# Patient Record
Sex: Male | Born: 1935
Health system: Southern US, Community
[De-identification: ages and names within clinical notes are randomized; demographics above are authoritative.]

## PROBLEM LIST (undated history)

## (undated) DIAGNOSIS — K921 Melena: Secondary | ICD-10-CM

## (undated) DIAGNOSIS — M1990 Unspecified osteoarthritis, unspecified site: Secondary | ICD-10-CM

## (undated) DIAGNOSIS — I77810 Thoracic aortic ectasia: Secondary | ICD-10-CM

## (undated) DIAGNOSIS — K602 Anal fissure, unspecified: Secondary | ICD-10-CM

## (undated) DIAGNOSIS — R7303 Prediabetes: Secondary | ICD-10-CM

## (undated) DIAGNOSIS — I10 Essential (primary) hypertension: Secondary | ICD-10-CM

## (undated) DIAGNOSIS — E785 Hyperlipidemia, unspecified: Secondary | ICD-10-CM

## (undated) DIAGNOSIS — Z87442 Personal history of urinary calculi: Secondary | ICD-10-CM

## (undated) DIAGNOSIS — C4490 Unspecified malignant neoplasm of skin, unspecified: Secondary | ICD-10-CM

## (undated) DIAGNOSIS — K6289 Other specified diseases of anus and rectum: Secondary | ICD-10-CM

## (undated) DIAGNOSIS — N189 Chronic kidney disease, unspecified: Secondary | ICD-10-CM

## (undated) HISTORY — DX: Anal fissure, unspecified: K60.2

## (undated) HISTORY — DX: Essential (primary) hypertension: I10

## (undated) HISTORY — DX: Other specified diseases of anus and rectum: K62.89

## (undated) HISTORY — DX: Melena: K92.1

## (undated) HISTORY — DX: Hyperlipidemia, unspecified: E78.5

## (undated) HISTORY — PX: OTHER SURGICAL HISTORY: SHX169

## (undated) HISTORY — PX: HERNIA REPAIR: SHX51

---

## 1995-07-03 HISTORY — PX: SHOULDER SURGERY: SHX246

## 1998-01-31 ENCOUNTER — Ambulatory Visit (HOSPITAL_COMMUNITY): Admission: RE | Admit: 1998-01-31 | Discharge: 1998-01-31 | Payer: Self-pay | Admitting: Neurosurgery

## 1998-02-03 ENCOUNTER — Inpatient Hospital Stay (HOSPITAL_COMMUNITY): Admission: RE | Admit: 1998-02-03 | Discharge: 1998-02-04 | Payer: Self-pay | Admitting: Neurosurgery

## 1999-02-03 ENCOUNTER — Encounter: Payer: Self-pay | Admitting: Neurosurgery

## 1999-02-03 ENCOUNTER — Ambulatory Visit (HOSPITAL_COMMUNITY): Admission: RE | Admit: 1999-02-03 | Discharge: 1999-02-03 | Payer: Self-pay | Admitting: Neurosurgery

## 1999-07-04 ENCOUNTER — Encounter: Admission: RE | Admit: 1999-07-04 | Discharge: 1999-07-04 | Payer: Self-pay | Admitting: Neurosurgery

## 1999-07-04 ENCOUNTER — Encounter: Payer: Self-pay | Admitting: Neurosurgery

## 1999-08-08 ENCOUNTER — Ambulatory Visit (HOSPITAL_BASED_OUTPATIENT_CLINIC_OR_DEPARTMENT_OTHER): Admission: RE | Admit: 1999-08-08 | Discharge: 1999-08-08 | Payer: Self-pay | Admitting: Otolaryngology

## 1999-08-08 ENCOUNTER — Encounter (INDEPENDENT_AMBULATORY_CARE_PROVIDER_SITE_OTHER): Payer: Self-pay | Admitting: *Deleted

## 2004-06-02 ENCOUNTER — Ambulatory Visit (HOSPITAL_COMMUNITY): Admission: RE | Admit: 2004-06-02 | Discharge: 2004-06-02 | Payer: Self-pay | Admitting: Neurosurgery

## 2006-08-13 ENCOUNTER — Ambulatory Visit: Payer: Self-pay | Admitting: Gastroenterology

## 2006-09-04 ENCOUNTER — Ambulatory Visit: Payer: Self-pay | Admitting: Gastroenterology

## 2007-07-30 ENCOUNTER — Ambulatory Visit (HOSPITAL_COMMUNITY): Admission: RE | Admit: 2007-07-30 | Discharge: 2007-07-30 | Payer: Self-pay | Admitting: Neurosurgery

## 2008-07-06 ENCOUNTER — Ambulatory Visit (HOSPITAL_COMMUNITY): Admission: RE | Admit: 2008-07-06 | Discharge: 2008-07-06 | Payer: Self-pay | Admitting: General Surgery

## 2009-04-08 ENCOUNTER — Ambulatory Visit (HOSPITAL_COMMUNITY): Admission: RE | Admit: 2009-04-08 | Discharge: 2009-04-08 | Payer: Self-pay | Admitting: Neurosurgery

## 2010-10-05 LAB — CREATININE, SERUM
Creatinine, Ser: 1.06 mg/dL (ref 0.4–1.5)
GFR calc Af Amer: >60 mL/min (ref >60)
GFR calc non Af Amer: >60 mL/min (ref >60)

## 2010-11-14 NOTE — Op Note (Signed)
NAME:  Jesse Fletcher, Jesse Fletcher NO.:  [id number]   MEDICAL RECORD NO.:  [id number]          PATIENT TYPE:  AMB   LOCATION:  DAY                          FACILITY:  [facility]   PHYSICIAN:  Adolph Pollack, M.D.DATE OF BIRTH:  May 09, 1935   DATE OF PROCEDURE:  07/06/2008  DATE OF DISCHARGE:                               OPERATIVE REPORT   PREOPERATIVE DIAGNOSIS:  Right inguinal hernia.   POSTOPERATIVE DIAGNOSIS:  Indirect right inguinal hernia.   PROCEDURE:  Laparoscopic right inguinal hernia repair with mesh.   SURGEON:  Adolph Pollack, M.D.   ANESTHESIA:  General plus Marcaine local.   INDICATIONS:  This 76 year old male has been diagnosed with a right  inguinal hernia that is reducible.  He requests repair and he would like  the laparoscopic repair.  He now presents for that.  We discussed  procedure, risks and aftercare preoperatively.   TECHNIQUE:  He was seen in the holding area.  He was marked with my  initials in the right groin.  He voided and was brought to the operating  room, placed supine on the operating table and general anesthetic was  administered.  The hair on the lower abdominal wall was clipped and the  area sterilely prepped and draped.  Marcaine solution was infiltrated in  the subumbilical region.  A small transverse subumbilical incision was  made through the skin and subcutaneous tissue.  The right anterior  rectus sheath was exposed and a small incision made in it.  The  underlying right rectus muscle was swept laterally exposing the  posterior rectus sheath.  A balloon dissection device and trocar  combination were then inserted into the extraperitoneal space and  balloon dissection was performed under laparoscopic vision of the right  extraperitoneal space and lower midline.  Once this was adequate,  balloon was removed and CO2 gas insufflated creating a working space.  A  laparoscope was introduced and two 5 mm trocars were placed in the  lower  abdomen just to the right in the midline.   Using blunt dissection, I identified the symphysis pubis and Cooper's  ligament on the right side.  The direct space appeared to be fairly  solid.  I then dissected fibrofatty tissue free from the lateral  abdominal wall up to the level of the umbilicus and from the anterior  abdominal wall as well.  I exposed spermatic cord and noted an indirect  hernia sac as well as indirect hernia contents and some extraperitoneal  fat going through a patulous internal ring.  I created a window around  the spermatic cord and dissected the sac off the cord as well as  extraperitoneal fat to the level of the umbilicus.   I brought in a piece of 6 x 6 Parietex mesh and cut it to be 5.6.  A  partial longitudinal slit was cut into the mesh and it was then placed  into the right extraperitoneal space.  The mesh was positioned  appropriately with two tails wrapped around the spermatic cord.   The mesh was then anchored to Cooper's ligament,  the anterior and  lateral abdominal walls with spiral tacks.  This provided for adequate  coverage of the direct, indirect and femoral spaces with good overlap.   I then inspected the area and hemostasis was adequate.  The CO2 gas was  released and I watched the extraperitoneal contents approximate the  mesh.  All trocars were then removed.   The right anterior rectus muscle fascial defect was closed with  interrupted 0 Vicryl sutures.  The skin incisions were closed with 4-0  Monocryl subcuticular stitches.  Steri-Strips and sterile dressings were  applied.   He tolerated the procedure without any apparent complications and was  taken to recovery in satisfactory condition.  He will be given discharge  instructions and Percocet for pain.  Follow-up in the office in 2-3  weeks.      Adolph Pollack, M.D.  Electronically Signed     TJR/MEDQ  D:  07/06/2008  T:  07/06/2008  Job:  [id number]   cc:   Excell Seltzer.  Annabell Howells, M.D.  Fax: [phone]

## 2010-11-17 NOTE — Assessment & Plan Note (Signed)
[facility]                         GASTROENTEROLOGY OFFICE NOTE   Jesse Fletcher, Jesse Fletcher                       MRN:          [id number]  DATE:08/13/2006                            DOB:          July 20, 1934    REASON FOR REFERRAL:  Dr. Christell Constant asked me to evaluate Jesse Fletcher in  consultation regarding chronic dyspepsia.   HISTORY OF PRESENT ILLNESS:  Jesse Fletcher is a very pleasant 76 year old  man who has been on chronic Aleve and Advil for many years for various  orthopedic pains. Jesse Fletcher says about 2 years ago Jesse Fletcher started having some upper  GI discomforts, belching, churning feeling, gassy feeling in his  stomach. Jesse Fletcher held his Aleve and Advil for about 3 weeks and things  defiantly improved. Jesse Fletcher restarted the meds after that. Jesse Fletcher noticed a  return of these discomforts for the past 3 to 4 months. Jesse Fletcher describes it  again as a churning feeling, a gassiness, extreme hunger in the morning.  Three weeks ago Jesse Fletcher saw his primary care physician who held his Aleve and  Advil and started him on Protonix once daily taken 20 to 30 minutes  before his breakfast meal. Jesse Fletcher says since making those 2 changes Jesse Fletcher is  40% to 50% better.   Recent lab tests done 2 weeks ago show a normal CBC, a normal complete  metabolic profile.   REVIEW OF SYSTEMS:  Notable for no fevers, chills, no swallowing  difficulty, no nausea, vomiting, no overt GI bleeding. Jesse Fletcher has  intermittent constipation, this is very rare. No pyrosis. Jesse Fletcher has had  stable weight for years.   PAST MEDICAL HISTORY:  Hypertension, kidney stones, shoulder and back  surgery.   CURRENT MEDICATIONS:  Protonix, lisinopril, hydrochlorothiazide, Centrum  Silver.   ALLERGIES:  No known drug allergies.   SOCIAL HISTORY:  Widowed, lives by himself, 2 children, retried from  sales, nonsmoker, nondrinker.   FAMILY HISTORY:  No colon cancer, colon polyps in family.   PHYSICAL EXAMINATION:  Jesse Fletcher is 6 feet 2 inches, 213 pounds, blood  pressure  132/80, pulse 100.  CONSTITUTIONAL: Generally well-appearing.  NEUROLOGIC: Alert and oriented x3.  EYES: Extraocular movements intact.  MOUTH: Oropharynx  moist, no lesions.  NECK: Supple, no lymphadenopathy.  CARDIOVASCULAR/HEART: Regular rate and rhythm.  LUNGS: Clear to auscultation bilaterally.  ABDOMEN: Soft, nontender, nondistended, normal bowel sounds.  EXTREMITIES: No lower extremity edema.  SKIN: No rashes or lesions on visible extremities.   ASSESSMENT/PLAN:  A 76 year old man with dyspepsia likely nonsteroidal  anti-inflammatory drug (NSAID) related.   Jesse Fletcher has noticed that since beginning the Protonix Jesse Fletcher has daily mild  headaches,  this not an uncommon side effect from PPIs. I think his  discomforts were probably NSAID related but to be safe given his age I  will arrange for him to have EGD at his soonest convenience. Jesse Fletcher has been  having colorectal cancer screening with flexible sigmoidoscopy every 5  years and Jesse Fletcher is due for 1 in the next several months Jesse Fletcher believes and so  I will arrange for him to have a full colonoscopy at that  time as well.  If that shows no polyps, then Jesse Fletcher does not need colorectal cancer  screening for 10 years. I see no reason for any further blood test or  imaging studies prior to colonoscopy and an upper endoscopy.     Rachael Fee, MD  Electronically Signed    DPJ/MedQ  DD: 08/13/2006  DT: 08/13/2006  Job #: [id number]   cc:   Ernestina Penna, M.D.

## 2011-04-06 LAB — CBC
MCV: 88.3 fL (ref 78.0–100.0)
Platelets: 266 10*3/uL (ref 150–400)
RBC: 5.04 MIL/uL (ref 4.22–5.81)
WBC: 6.9 10*3/uL (ref 4.0–10.5)

## 2011-04-06 LAB — COMPREHENSIVE METABOLIC PANEL
ALT: 21 U/L (ref 0–53)
AST: 26 U/L (ref 0–37)
Albumin: 3.9 g/dL (ref 3.5–5.2)
Alkaline Phosphatase: 59 U/L (ref 39–117)
CO2: 30 mEq/L (ref 19–32)
Chloride: 105 mEq/L (ref 96–112)
Creatinine, Ser: 0.85 mg/dL (ref 0.4–1.5)
GFR calc Af Amer: >60 mL/min (ref >60)
GFR calc non Af Amer: >60 mL/min (ref >60)
Potassium: 4.7 mEq/L (ref 3.5–5.1)
Sodium: 143 mEq/L (ref 135–145)
Total Bilirubin: 0.8 mg/dL (ref 0.3–1.2)

## 2011-04-06 LAB — DIFFERENTIAL
Basophils Absolute: 0 10*3/uL (ref 0.0–0.1)
Basophils Relative: 0 % (ref 0–1)
Eosinophils Absolute: 0.3 10*3/uL (ref 0.0–0.7)
Eosinophils Relative: 4 % (ref 0–5)
Monocytes Absolute: 0.5 10*3/uL (ref 0.1–1.0)

## 2011-05-21 ENCOUNTER — Ambulatory Visit (INDEPENDENT_AMBULATORY_CARE_PROVIDER_SITE_OTHER): Payer: Medicare Other | Admitting: General Surgery

## 2011-05-21 ENCOUNTER — Encounter (INDEPENDENT_AMBULATORY_CARE_PROVIDER_SITE_OTHER): Payer: Self-pay | Admitting: General Surgery

## 2011-05-21 DIAGNOSIS — K649 Unspecified hemorrhoids: Secondary | ICD-10-CM | POA: Insufficient documentation

## 2011-05-21 DIAGNOSIS — K602 Anal fissure, unspecified: Secondary | ICD-10-CM | POA: Insufficient documentation

## 2011-05-21 NOTE — Progress Notes (Signed)
76 yo male coming in with complaint of anal fissure.  Pt has been dealing with problem for the last 25 years on and off.   Was seen By Dr. Sharlette Dense previously.  Pt has been struggling with constipation as well and now using a stool softner daily.   Pt states 2 weeks ago had a hard stool felt a little tearing and had pain for about 1 week, since that time doing much better but wanted to see if there is anything that can be done for it.  Pt denies BRBPR, melena or any more strainging.  ROS Denies fever, chills, nausea vomiting abdominal pain, dysuria, chest pain, shortness of breath dyspnea on exertion or numbness in extremities  Past medical history, social, surgical and family history all reviewed.   PE: Blood pressure 128/82, pulse 68, temperature 97.2 F (36.2 C), temperature source Temporal, resp. rate 16, height 6\' 2"  (1.88 m), weight 210 lb 3.2 oz (95.346 kg). Gen: NAD pleasant male Rectal exam:  Pt has no external hemorrhoids present, pt has a healed scar at the 12 o clock position of the rectal mucosa. No internal hemorrhoids present, pt NT on exam, no active fissure.   A/P Hx of anal fissures, Hx of external hemorrhoids. None that are active.  Dilt 2.5% cream to have on hand Colace increased to BID to help with stool.   Pt to RTC PRN, pt given red flags.

## 2011-05-21 NOTE — Patient Instructions (Signed)
Nice to see you I am giving you some Diltizem to have on hand. Use this maybe 2-3 times a week unless you have a tear and then would consider doing it four times a day. Would increase your colace to 100 mg twice a day to help make sure you do not get another tear.  Call us if you need to

## 2011-05-29 ENCOUNTER — Encounter (INDEPENDENT_AMBULATORY_CARE_PROVIDER_SITE_OTHER): Payer: Self-pay | Admitting: General Surgery

## 2011-08-22 ENCOUNTER — Ambulatory Visit (INDEPENDENT_AMBULATORY_CARE_PROVIDER_SITE_OTHER): Payer: Medicare Other | Admitting: General Surgery

## 2011-08-22 ENCOUNTER — Encounter (INDEPENDENT_AMBULATORY_CARE_PROVIDER_SITE_OTHER): Payer: Self-pay | Admitting: General Surgery

## 2011-08-22 VITALS — BP 142/86 | HR 67 | Temp 98.1°F | Ht 74.0 in | Wt 210.6 lb

## 2011-08-22 DIAGNOSIS — K602 Anal fissure, unspecified: Secondary | ICD-10-CM

## 2011-08-22 NOTE — Progress Notes (Signed)
He is here for followup for his chronic anal fissure. He still having intermittent episodes of discomfort and bright red blood on the tissue paper after some bowel movements. He's been intermittently using the diltiazem cream.  Exam: Anal rectal-at 10:00 position there is a smooth fissure present that is very small and nonbleeding. It is nontender. No masses are felt on digital rectal exam. Some external hemorrhoids are noted.  Assessment: Chronic anal fissure with intermittent bleeding from it. He is not having much pain from it.  Plan: Use the diltiazem cream 4 times a day for 6 weeks. Followup appointment at that time. If it is still problematic, I told him we could discuss surgical repair.

## 2011-08-22 NOTE — Patient Instructions (Signed)
Apply medicine to area four times a day.

## 2011-10-10 ENCOUNTER — Encounter (INDEPENDENT_AMBULATORY_CARE_PROVIDER_SITE_OTHER): Payer: Self-pay | Admitting: General Surgery

## 2011-10-10 ENCOUNTER — Ambulatory Visit (INDEPENDENT_AMBULATORY_CARE_PROVIDER_SITE_OTHER): Payer: Medicare Other | Admitting: General Surgery

## 2011-10-10 VITALS — BP 124/78 | HR 82 | Temp 98.6°F | Resp 18 | Ht 74.0 in | Wt 212.6 lb

## 2011-10-10 DIAGNOSIS — K602 Anal fissure, unspecified: Secondary | ICD-10-CM

## 2011-10-10 NOTE — Patient Instructions (Signed)
Stay well hydrated. Keep stool soft and use stool softeners as needed for this. Return visit if you start having pain and bleeding from the fissure again.

## 2011-10-10 NOTE — Progress Notes (Signed)
Subjective:     Patient ID: Jesse Fletcher., male   DOB: 22-Aug-1934, 77 y.o.   MRN: [id number]  HPI He is here for follow up of his chronic anal fissure. He has been using diltiazem cream as directed. He has no pain and no bleeding. He is taking a stool softener twice a day and his bowels are soft.  Review of Systems No anal bleeding or pain.    Objective:   Physical Exam Anorectal:  The fissure has healed.    Assessment:     Anal fissure-healed with medical treatment    Plan:     Stay well hydrated and keep stool soft using stool softeners needed. Return visit if a symptomatic fissure recurs.

## 2011-10-24 DIAGNOSIS — E785 Hyperlipidemia, unspecified: Secondary | ICD-10-CM | POA: Diagnosis not present

## 2011-10-24 DIAGNOSIS — I10 Essential (primary) hypertension: Secondary | ICD-10-CM | POA: Diagnosis not present

## 2011-10-26 DIAGNOSIS — E785 Hyperlipidemia, unspecified: Secondary | ICD-10-CM | POA: Diagnosis not present

## 2011-11-15 DIAGNOSIS — B070 Plantar wart: Secondary | ICD-10-CM | POA: Diagnosis not present

## 2011-11-15 DIAGNOSIS — L570 Actinic keratosis: Secondary | ICD-10-CM | POA: Diagnosis not present

## 2011-12-19 DIAGNOSIS — T2000XA Burn of unspecified degree of head, face, and neck, unspecified site, initial encounter: Secondary | ICD-10-CM | POA: Diagnosis not present

## 2012-02-04 DIAGNOSIS — J069 Acute upper respiratory infection, unspecified: Secondary | ICD-10-CM | POA: Diagnosis not present

## 2012-02-14 DIAGNOSIS — D485 Neoplasm of uncertain behavior of skin: Secondary | ICD-10-CM | POA: Diagnosis not present

## 2012-02-14 DIAGNOSIS — L570 Actinic keratosis: Secondary | ICD-10-CM | POA: Diagnosis not present

## 2012-02-28 DIAGNOSIS — K649 Unspecified hemorrhoids: Secondary | ICD-10-CM | POA: Diagnosis not present

## 2012-03-28 DIAGNOSIS — Z23 Encounter for immunization: Secondary | ICD-10-CM | POA: Diagnosis not present

## 2012-04-08 DIAGNOSIS — T7840XA Allergy, unspecified, initial encounter: Secondary | ICD-10-CM | POA: Diagnosis not present

## 2012-05-26 DIAGNOSIS — N138 Other obstructive and reflux uropathy: Secondary | ICD-10-CM | POA: Diagnosis not present

## 2012-06-02 DIAGNOSIS — N403 Nodular prostate with lower urinary tract symptoms: Secondary | ICD-10-CM | POA: Diagnosis not present

## 2012-06-02 DIAGNOSIS — Z87442 Personal history of urinary calculi: Secondary | ICD-10-CM | POA: Diagnosis not present

## 2012-06-02 DIAGNOSIS — N411 Chronic prostatitis: Secondary | ICD-10-CM | POA: Diagnosis not present

## 2012-07-17 ENCOUNTER — Other Ambulatory Visit: Payer: Self-pay | Admitting: Neurosurgery

## 2012-07-23 DIAGNOSIS — M5430 Sciatica, unspecified side: Secondary | ICD-10-CM | POA: Diagnosis not present

## 2012-07-23 DIAGNOSIS — M545 Low back pain, unspecified: Secondary | ICD-10-CM | POA: Diagnosis not present

## 2012-07-24 DIAGNOSIS — T148XXA Other injury of unspecified body region, initial encounter: Secondary | ICD-10-CM | POA: Diagnosis not present

## 2012-07-31 ENCOUNTER — Other Ambulatory Visit (HOSPITAL_COMMUNITY): Payer: Self-pay | Admitting: Neurosurgery

## 2012-07-31 DIAGNOSIS — M5137 Other intervertebral disc degeneration, lumbosacral region: Secondary | ICD-10-CM

## 2012-08-06 ENCOUNTER — Ambulatory Visit (HOSPITAL_COMMUNITY)
Admission: RE | Admit: 2012-08-06 | Discharge: 2012-08-06 | Disposition: A | Discharge status: Home / Self Care | Payer: Medicare Other | Source: Ambulatory Visit | Attending: Neurosurgery | Admitting: Neurosurgery

## 2012-08-06 DIAGNOSIS — M79609 Pain in unspecified limb: Secondary | ICD-10-CM | POA: Insufficient documentation

## 2012-08-06 DIAGNOSIS — M5137 Other intervertebral disc degeneration, lumbosacral region: Secondary | ICD-10-CM | POA: Diagnosis not present

## 2012-08-06 DIAGNOSIS — M545 Low back pain, unspecified: Secondary | ICD-10-CM | POA: Diagnosis not present

## 2012-08-06 DIAGNOSIS — M51379 Other intervertebral disc degeneration, lumbosacral region without mention of lumbar back pain or lower extremity pain: Secondary | ICD-10-CM | POA: Insufficient documentation

## 2012-08-06 DIAGNOSIS — M5146 Schmorl's nodes, lumbar region: Secondary | ICD-10-CM | POA: Insufficient documentation

## 2012-08-06 LAB — CREATININE, SERUM
Creatinine, Ser: 1.01 mg/dL (ref 0.50–1.35)
GFR calc Af Amer: 81 mL/min — ABNORMAL LOW (ref >90)
GFR calc non Af Amer: 70 mL/min — ABNORMAL LOW (ref >90)

## 2012-08-06 MED ORDER — GADOBENATE DIMEGLUMINE 529 MG/ML IV SOLN
20.0000 mL | Freq: Once | INTRAVENOUS | Status: AC | PRN
  Administered 2012-08-06: 20 mL via INTRAVENOUS

## 2012-08-12 DIAGNOSIS — D235 Other benign neoplasm of skin of trunk: Secondary | ICD-10-CM | POA: Diagnosis not present

## 2012-08-12 DIAGNOSIS — L570 Actinic keratosis: Secondary | ICD-10-CM | POA: Diagnosis not present

## 2012-08-13 DIAGNOSIS — M5137 Other intervertebral disc degeneration, lumbosacral region: Secondary | ICD-10-CM | POA: Diagnosis not present

## 2012-08-13 DIAGNOSIS — M47817 Spondylosis without myelopathy or radiculopathy, lumbosacral region: Secondary | ICD-10-CM | POA: Diagnosis not present

## 2012-09-18 ENCOUNTER — Other Ambulatory Visit: Payer: Self-pay | Admitting: Family Medicine

## 2012-10-31 ENCOUNTER — Other Ambulatory Visit: Payer: Self-pay | Admitting: Family Medicine

## 2012-11-03 ENCOUNTER — Telehealth: Payer: Self-pay | Admitting: Family Medicine

## 2012-11-03 MED ORDER — METOPROLOL TARTRATE 25 MG PO TABS: 25.0000 mg | ORAL_TABLET | Freq: Two times a day (BID) | ORAL | Status: DC

## 2012-11-03 NOTE — Telephone Encounter (Signed)
Rx Refilled  

## 2012-12-11 ENCOUNTER — Other Ambulatory Visit: Payer: Self-pay | Admitting: Family Medicine

## 2012-12-30 ENCOUNTER — Ambulatory Visit: Payer: Medicare Other | Admitting: Family Medicine

## 2012-12-30 VITALS — BP 110/78

## 2012-12-30 DIAGNOSIS — I10 Essential (primary) hypertension: Secondary | ICD-10-CM

## 2013-01-01 ENCOUNTER — Ambulatory Visit (INDEPENDENT_AMBULATORY_CARE_PROVIDER_SITE_OTHER): Payer: Medicare Other | Admitting: Physician Assistant

## 2013-01-01 ENCOUNTER — Encounter: Payer: Self-pay | Admitting: Physician Assistant

## 2013-01-01 VITALS — BP 100/60 | HR 68 | Temp 97.9°F | Resp 16 | Ht 74.0 in | Wt 208.0 lb

## 2013-01-01 DIAGNOSIS — R2231 Localized swelling, mass and lump, right upper limb: Secondary | ICD-10-CM

## 2013-01-01 DIAGNOSIS — R229 Localized swelling, mass and lump, unspecified: Secondary | ICD-10-CM

## 2013-01-01 NOTE — Progress Notes (Signed)
   Patient ID: Jesse Fletcher. MRN: [id number], DOB: 03/19/1935, 78 y.o. Date of Encounter: 01/01/2013, 3:13 PM    Chief Complaint:  Chief Complaint  Patient presents with  . Probs. with right arm     HPI: 78 y.o. year old white male reports that about 1 1/2 weeks ago he was doing yard work-thinks something bit him-his right forearm became swollen. He took Benadryl and in 1-2 days the swelling went down. Since then, he has noticed area of firmness on right forearm. Wanted to get it checked.   Home Meds: See attached medication section for any medications that were entered at today's visit. The computer does not put those onto this list.The following list is a list of meds entered prior to today's visit.   Current Outpatient Prescriptions on File Prior to Visit  Medication Sig Dispense Refill  . aspirin 81 MG tablet Take 81 mg by mouth daily.        Marland Kitchen lisinopril-hydrochlorothiazide (PRINZIDE,ZESTORETIC) 10-12.5 MG per tablet TAKE ONE TABLET BY MOUTH ONCE DAILY. PT NEEDS TO  BE SEEN BEFORE ANY FURTHER REFILLS.  90 tablet  0  . metoprolol tartrate (LOPRESSOR) 25 MG tablet Take 1 tablet (25 mg total) by mouth 2 (two) times daily.  180 tablet  3  . simvastatin (ZOCOR) 20 MG tablet Take 20 mg by mouth at bedtime.         No current facility-administered medications on file prior to visit.    Allergies: No Known Allergies    Review of Systems: See HPI for pertinent ROS. All other ROS negative.    Physical Exam: Blood pressure 100/60, pulse 68, temperature 97.9 F (36.6 C), temperature source Oral, resp. rate 16, height 6\' 2"  (1.88 m), weight 208 lb (94.348 kg)., Body mass index is 26.69 kg/(m^2). General: WNWD WM.  Appears in no acute distress. Lungs: Clear bilaterally to auscultation without wheezes, rales, or rhonchi. Breathing is unlabored. Heart: Regular rhythm. No murmurs, rubs, or gallops. Msk:  Strength and tone normal for age. I measured circumferance around each forearm at  the same (4inches) distance from hte loecranon process. The Right Forearm measures 11 2/8 inches.  The left forearm measures 10 3/4 inches. The Right arm appears larger than the left in general. I have palpated area that pt is concerned about. I do feel area of firmness ( just distal to olecranon process, slightly medial aspect. However, it feels like it is part of the muscle rather than a mass.  Extremities/Skin: Warm and dry. No clubbing or cyanosis. No edema. No rashes or suspicious lesions. Neuro: Alert and oriented X 3. Moves all extremities spontaneously. Gait is normal. CNII-XII grossly in tact. Psych:  Responds to questions appropriately with a normal affect.     ASSESSMENT AND PLAN:  78 y.o. year old male with  1. Mass of arm, right Will obtain ultrasound to help determine whether area of concern is part of muscle vs mass.  - [place] Misc Soft Tissue; Future   Signed, Shon Hale [place], [place], [facility] 01/01/2013 3:13 PM

## 2013-01-06 ENCOUNTER — Encounter: Payer: Self-pay | Admitting: Family Medicine

## 2013-01-06 ENCOUNTER — Ambulatory Visit (INDEPENDENT_AMBULATORY_CARE_PROVIDER_SITE_OTHER): Payer: Medicare Other | Admitting: Family Medicine

## 2013-01-06 VITALS — BP 110/78 | HR 68 | Temp 97.7°F | Resp 16 | Wt 209.0 lb

## 2013-01-06 DIAGNOSIS — I10 Essential (primary) hypertension: Secondary | ICD-10-CM | POA: Diagnosis not present

## 2013-01-06 MED ORDER — LISINOPRIL 10 MG PO TABS: 10.0000 mg | ORAL_TABLET | Freq: Every day | ORAL | Status: DC

## 2013-01-06 NOTE — Progress Notes (Signed)
  Subjective:    Patient ID: Jesse Larsson., male    DOB: 1935/04/28, 78 y.o.   MRN: [id number]  HPI  Patient has noticed his blood pressures running 100-110 over 60s to 70s.  His medication list is reviewed. He felt lightheaded and dizzy at times with low blood pressure. He denies any chest pain shortness of breath or dyspnea on exertion. Past Medical History  Diagnosis Date  . Hyperlipidemia   . Rectal pain   . Blood in stool   . Hemorrhoids   . Rectal fissure   . Hypertension    Current Outpatient Prescriptions on File Prior to Visit  Medication Sig Dispense Refill  . aspirin 81 MG tablet Take 81 mg by mouth daily.        Marland Kitchen lisinopril-hydrochlorothiazide (PRINZIDE,ZESTORETIC) 10-12.5 MG per tablet TAKE ONE TABLET BY MOUTH ONCE DAILY. PT NEEDS TO  BE SEEN BEFORE ANY FURTHER REFILLS.  90 tablet  0  . metoprolol tartrate (LOPRESSOR) 25 MG tablet Take 1 tablet (25 mg total) by mouth 2 (two) times daily.  180 tablet  3  . simvastatin (ZOCOR) 20 MG tablet Take 20 mg by mouth at bedtime.         No current facility-administered medications on file prior to visit.   No Known Allergies History   Social History  . Marital Status: Widowed    Spouse Name: N/A    Number of Children: N/A  . Years of Education: N/A   Occupational History  . Not on file.   Social History Main Topics  . Smoking status: Never Smoker   . Smokeless tobacco: Never Used  . Alcohol Use: No  . Drug Use: No  . Sexually Active: Not on file   Other Topics Concern  . Not on file   Social History Narrative  . No narrative on file     Review of Systems  All other systems reviewed and are negative.       Objective:   Physical Exam  Vitals reviewed. Constitutional: He appears well-developed and well-nourished.  Neck: No JVD present. No thyromegaly present.  Cardiovascular: Normal rate, regular rhythm and normal heart sounds.   No murmur heard. Pulmonary/Chest: Effort normal and breath sounds  normal.  Abdominal: Soft. Bowel sounds are normal.  Lymphadenopathy:    He has no cervical adenopathy.   no peripheral edema.        Assessment & Plan:  1. HTN (hypertension) Discontinue Zestoretic.  Switch the patient to lisinopril 10 mg by mouth daily alone. Recheck blood pressures in one month. - lisinopril (PRINIVIL,ZESTRIL) 10 MG tablet; Take 1 tablet (10 mg total) by mouth daily.  Dispense: 90 tablet; Refill: 3

## 2013-01-07 ENCOUNTER — Other Ambulatory Visit: Payer: Self-pay | Admitting: Family Medicine

## 2013-01-07 DIAGNOSIS — R2231 Localized swelling, mass and lump, right upper limb: Secondary | ICD-10-CM

## 2013-01-08 ENCOUNTER — Ambulatory Visit
Admission: RE | Admit: 2013-01-08 | Discharge: 2013-01-08 | Disposition: A | Discharge status: Home / Self Care | Payer: Medicare Other | Source: Ambulatory Visit | Attending: Physician Assistant | Admitting: Physician Assistant

## 2013-01-08 DIAGNOSIS — R2231 Localized swelling, mass and lump, right upper limb: Secondary | ICD-10-CM

## 2013-01-08 DIAGNOSIS — M25839 Other specified joint disorders, unspecified wrist: Secondary | ICD-10-CM | POA: Diagnosis not present

## 2013-01-09 ENCOUNTER — Encounter: Payer: Self-pay | Admitting: Family Medicine

## 2013-01-14 ENCOUNTER — Ambulatory Visit (INDEPENDENT_AMBULATORY_CARE_PROVIDER_SITE_OTHER): Payer: Medicare Other | Admitting: Physician Assistant

## 2013-01-14 ENCOUNTER — Encounter: Payer: Self-pay | Admitting: Physician Assistant

## 2013-01-14 VITALS — BP 154/102 | HR 76 | Temp 97.8°F | Resp 18 | Ht 74.0 in | Wt 210.0 lb

## 2013-01-14 DIAGNOSIS — I10 Essential (primary) hypertension: Secondary | ICD-10-CM

## 2013-01-14 DIAGNOSIS — E785 Hyperlipidemia, unspecified: Secondary | ICD-10-CM | POA: Diagnosis not present

## 2013-01-14 LAB — COMPLETE METABOLIC PANEL WITH GFR
Albumin: 4 g/dL (ref 3.5–5.2)
Alkaline Phosphatase: 53 U/L (ref 39–117)
BUN: 16 mg/dL (ref 6–23)
CO2: 28 mEq/L (ref 19–32)
GFR, Est African American: 83 mL/min
GFR, Est Non African American: 72 mL/min
Glucose, Bld: 104 mg/dL — ABNORMAL HIGH (ref 70–99)
Sodium: 143 mEq/L (ref 135–145)
Total Bilirubin: 0.8 mg/dL (ref 0.3–1.2)
Total Protein: 6.4 g/dL (ref 6.0–8.3)

## 2013-01-14 LAB — LIPID PANEL
Cholesterol: 126 mg/dL (ref 0–200)
HDL: 40 mg/dL (ref >39)
Total CHOL/HDL Ratio: 3.2 Ratio

## 2013-01-14 MED ORDER — LISINOPRIL-HYDROCHLOROTHIAZIDE 10-12.5 MG PO TABS: 1.0000 | ORAL_TABLET | Freq: Every day | ORAL | Status: DC

## 2013-01-14 NOTE — Progress Notes (Signed)
Patient ID: Jesse Fletcher. MRN: [id number], DOB: 1935-05-18, 78 y.o. Date of Encounter: 01/14/2013, 8:44 AM    Chief Complaint:  Chief Complaint  Patient presents with  . 6 mth check up/labs    is fasting  follow up after chg BP meds     HPI: 78 y.o. year old white male here for routine f/u of HTN and HLD.   1-HTN: Pt says he had been on metoprolol and lisin hct 10/12.5 for many years with no problem. Says he came by here for BP check 7/1/4 because BP had not been checked in long time. Nurse got 110/78.  Then, he came here 01/01/13 sec to problem with his arm. BP at that visit was similar. When he saw Dr. Tanya Nones 01/06/13 his note says that pt had c/o lightheadedness. Pt today tells me he NEVER had any lightheadedness and that he had felt fine on previous meds. However, b/c BP was low and we had documented lightheadedness, his BP med was changed on 01/06/13. Metoprolol was cont same. Lisin HCT 10/12.5 was changed to plain lisin 10mg  QD.  Pt telss me he never felt lightheaded.   He is still taking statin as directed. No myalgias, adv effects.   He has already gone for walk this am. No angina.     Home Meds: See attached medication section for any medications that were entered at today's visit. The computer does not put those onto this list.The following list is a list of meds entered prior to today's visit.   Current Outpatient Prescriptions on File Prior to Visit  Medication Sig Dispense Refill  . aspirin 81 MG tablet Take 81 mg by mouth daily.        . metoprolol tartrate (LOPRESSOR) 25 MG tablet Take 1 tablet (25 mg total) by mouth 2 (two) times daily.  180 tablet  3  . simvastatin (ZOCOR) 20 MG tablet Take 20 mg by mouth at bedtime.         No current facility-administered medications on file prior to visit.    Allergies: No Known Allergies    Review of Systems: See HPI for pertinent ROS. All other ROS negative.    Physical Exam: Blood pressure 154/102, pulse 76,  temperature 97.8 F (36.6 C), temperature source Oral, resp. rate 18, height 6\' 2"  (1.88 m), weight 210 lb (95.255 kg)., Body mass index is 26.95 kg/(m^2). I repeated BP myself: Right: 150/100. Left; 154/92. General: WNWD WM Appears in no acute distress. Neck: Supple. No thyromegaly. No lymphadenopathy.No carotid bruits. Lungs: Clear bilaterally to auscultation without wheezes, rales, or rhonchi. Breathing is unlabored. Heart: Regular rhythm. No murmurs, rubs, or gallops. Abdomen: Soft, non-tender, non-distended with normoactive bowel sounds. No hepatomegaly. No rebound/guarding. No obvious abdominal masses. Msk:  Strength and tone normal for age. 78 Extremities/Skin: Warm and dry. No clubbing or cyanosis. No edema.  Neuro: Alert and oriented X 3. Moves all extremities spontaneously. Gait is normal. CNII-XII grossly in tact. Psych:  Responds to questions appropriately with a normal affect.     ASSESSMENT AND PLAN:  78 y.o. year old male with  1. Hyperlipidemia On statin. Due to recheck labs. - COMPLETE METABOLIC PANEL WITH GFR - Lipid panel  2. Hypertension Will change back to lisin hctz. Cont current dose metoprolol. Check BMET. - lisinopril-hydrochlorothiazide (PRINZIDE,ZESTORETIC) 10-12.5 MG per tablet; Take 1 tablet by mouth daily.  Dispense: 90 tablet; Refill: 3 - COMPLETE METABOLIC PANEL WITH GFR  ROV, Fasting Lab 6 months.  Leanora Ivanoff  [place], [place], [facility] 01/14/2013 8:44 AM

## 2013-01-20 ENCOUNTER — Encounter: Payer: Self-pay | Admitting: Family Medicine

## 2013-01-20 ENCOUNTER — Ambulatory Visit (INDEPENDENT_AMBULATORY_CARE_PROVIDER_SITE_OTHER): Payer: Medicare Other | Admitting: Family Medicine

## 2013-01-20 VITALS — BP 160/88 | HR 78 | Temp 97.6°F | Resp 16 | Wt 210.0 lb

## 2013-01-20 DIAGNOSIS — I10 Essential (primary) hypertension: Secondary | ICD-10-CM

## 2013-01-20 NOTE — Progress Notes (Signed)
  Subjective:    Patient ID: Jesse Larsson., male    DOB: [birth year], 78 y.o.   MRN: [id number]  HPI  At his last office visit I discontinued his Zestoretic 10/12.5 one by mouth daily due to hypotension and dizziness.  6 days ago he was found to have elevated blood pressures of 160/80. He was instructed to resume the Zestoretic. He has been taking it daily and his blood pressure continues to be 160 over 80s. He denies chest pain shortness of breath or dyspnea on exertion. He is also still taking his Lopressor. He does have a dull headache. Past Medical History  Diagnosis Date  . Rectal pain   . Blood in stool   . Hemorrhoids   . Rectal fissure   . Hypertension   . Hyperlipidemia    Current Outpatient Prescriptions on File Prior to Visit  Medication Sig Dispense Refill  . aspirin 81 MG tablet Take 81 mg by mouth daily.        Marland Kitchen lisinopril-hydrochlorothiazide (PRINZIDE,ZESTORETIC) 10-12.5 MG per tablet Take 1 tablet by mouth daily.  90 tablet  3  . metoprolol tartrate (LOPRESSOR) 25 MG tablet Take 1 tablet (25 mg total) by mouth 2 (two) times daily.  180 tablet  3  . simvastatin (ZOCOR) 20 MG tablet Take 20 mg by mouth at bedtime.         No current facility-administered medications on file prior to visit.   No Known Allergies History   Social History  . Marital Status: Widowed    Spouse Name: N/A    Number of Children: N/A  . Years of Education: N/A   Occupational History  . Not on file.   Social History Main Topics  . Smoking status: Never Smoker   . Smokeless tobacco: Never Used  . Alcohol Use: No  . Drug Use: No  . Sexually Active: Not on file   Other Topics Concern  . Not on file   Social History Narrative  . No narrative on file     Review of Systems  All other systems reviewed and are negative.       Objective:   Physical Exam  Vitals reviewed. Constitutional: He appears well-developed and well-nourished.  Neck: Neck supple. No JVD present. No  thyromegaly present.  Cardiovascular: Normal rate, regular rhythm, normal heart sounds and intact distal pulses.  Exam reveals no gallop and no friction rub.   No murmur heard. Pulmonary/Chest: Effort normal and breath sounds normal. No respiratory distress. He has no wheezes. He has no rales. He exhibits no tenderness.  Abdominal: Soft. Bowel sounds are normal. He exhibits no distension. There is no tenderness. There is no rebound.  Musculoskeletal: He exhibits no edema.  Lymphadenopathy:    He has no cervical adenopathy.          Assessment & Plan:  1. Hypertension At lisinopril 10 mg by mouth daily and continue Zestoretic 10/12.5 by mouth daily recheck blood pressure in 2 weeks.  Allow the medications to take there full effect.

## 2013-01-30 ENCOUNTER — Ambulatory Visit: Payer: Medicare Other | Admitting: Family Medicine

## 2013-01-30 ENCOUNTER — Encounter: Payer: Self-pay | Admitting: Family Medicine

## 2013-01-30 VITALS — BP 144/82

## 2013-01-30 DIAGNOSIS — I10 Essential (primary) hypertension: Secondary | ICD-10-CM

## 2013-01-30 NOTE — Progress Notes (Signed)
Pt here for nurse visit BP check.  BP taken for provider to view.  Is taking all medications as ordered.  Denies any distress.

## 2013-02-10 DIAGNOSIS — D485 Neoplasm of uncertain behavior of skin: Secondary | ICD-10-CM | POA: Diagnosis not present

## 2013-02-10 DIAGNOSIS — L570 Actinic keratosis: Secondary | ICD-10-CM | POA: Diagnosis not present

## 2013-03-30 ENCOUNTER — Ambulatory Visit: Payer: Medicare Other | Admitting: Family Medicine

## 2013-03-30 ENCOUNTER — Telehealth: Payer: Self-pay | Admitting: Family Medicine

## 2013-03-30 VITALS — BP 140/94 | HR 58

## 2013-03-30 DIAGNOSIS — I10 Essential (primary) hypertension: Secondary | ICD-10-CM

## 2013-03-30 NOTE — Telephone Encounter (Signed)
Pt came in today to have BP checked it was 140/94 pulse 58. Over the weekend he went to [facility] to have it checked there and it was 177/100 pulse - 100. According to pt we did take him off the extra lisinopril and he states that he is on three medications but I only see 2 which one is a combo med. He will make an appt if you would like to see him or what do you want him to do about his BP?

## 2013-03-30 NOTE — Telephone Encounter (Signed)
Called pt back and had him go get his bottles of medication and he is taking - Lisinopril HCTZ 10/12.5mg  qd, lisinopril 10mg  qd and metoprolol 25mg  bid. Do you still want him to increase dose?

## 2013-03-30 NOTE — Telephone Encounter (Signed)
I would change lisinopril HCT to 20/12.5 and take 2 tabs per day and recheck BP in 2 weeks.  That is the max dose.

## 2013-03-31 NOTE — Telephone Encounter (Signed)
Increase lisinopril hctz 10/12.5 to 2 pills a day, continue lisinopril 10 qd and lopressor 25 bid. Recheck BP in 2 weeks.

## 2013-03-31 NOTE — Telephone Encounter (Signed)
Patient aware.

## 2013-04-02 ENCOUNTER — Ambulatory Visit: Payer: Medicare Other | Admitting: Family Medicine

## 2013-04-08 ENCOUNTER — Ambulatory Visit (INDEPENDENT_AMBULATORY_CARE_PROVIDER_SITE_OTHER): Payer: Medicare Other | Admitting: Family Medicine

## 2013-04-08 DIAGNOSIS — Z23 Encounter for immunization: Secondary | ICD-10-CM

## 2013-04-15 ENCOUNTER — Ambulatory Visit (INDEPENDENT_AMBULATORY_CARE_PROVIDER_SITE_OTHER): Payer: Medicare Other | Admitting: Family Medicine

## 2013-04-15 ENCOUNTER — Encounter: Payer: Self-pay | Admitting: Family Medicine

## 2013-04-15 VITALS — BP 160/82 | HR 80 | Temp 97.5°F | Resp 16 | Wt 210.0 lb

## 2013-04-15 DIAGNOSIS — J019 Acute sinusitis, unspecified: Secondary | ICD-10-CM | POA: Insufficient documentation

## 2013-04-15 DIAGNOSIS — I10 Essential (primary) hypertension: Secondary | ICD-10-CM

## 2013-04-15 MED ORDER — LISINOPRIL-HYDROCHLOROTHIAZIDE 10-12.5 MG PO TABS: 1.0000 | ORAL_TABLET | Freq: Two times a day (BID) | ORAL | Status: DC

## 2013-04-15 MED ORDER — LISINOPRIL 10 MG PO TABS: 10.0000 mg | ORAL_TABLET | Freq: Every day | ORAL | Status: DC

## 2013-04-15 MED ORDER — AMOXICILLIN-POT CLAVULANATE 875-125 MG PO TABS: 1.0000 | ORAL_TABLET | Freq: Two times a day (BID) | ORAL | Status: DC

## 2013-04-15 NOTE — Assessment & Plan Note (Signed)
  Based on the new JNC 8 recommendations and his age his goal should be around 140 systolic. We will check his renal function. I will start him back on the lisinopril 10 mg with the morning dose and he will be on 20/12.5 milligrams total lisinopril HCTZ in the a.m. and 10/12.5 milligrams in the p.m. He will followup with his PCP in one week for blood pressure recheck. At that time we will see if he needs to increase his evening dose as well. His metabolic panel will be checked today

## 2013-04-15 NOTE — Patient Instructions (Signed)
Take the lisinopril 10mg  with the morning dose of Lisinopril HCTZ and the metoprolol Check the blood pressure with cuff on the upper arm  Use the afrin for the next 2-3 days  Take the amoxicillin  For the sinuses Use some mucinex We will call with lab results  F/U 1 week with Dr. Tanya Nones

## 2013-04-15 NOTE — Progress Notes (Signed)
  Subjective:    Patient ID: Jesse Larsson., male    DOB: [birth year], 78 y.o.   MRN: [id number]  HPI Patient here with sinusitis. He is complaining of sinus drainage and congestion over the past 3 weeks. He tried some over-the-counter remedies with little improvement. He does not use a decongestant secondary to his hypertension. He states that the pressure gives him a headache around his nose as well as his eyes. Not had any changes vision or nausea vomiting associated.   He is also here to followup his blood pressure. He was here a few months ago when he was noted to have low blood pressure he was taken off of lisinopril HCTZ and continue on lisinopril 10 mg with his metoprolol 25 mg twice a day. He did return for recheck on his blood pressure and it was noted to be quite elevated therefore he was started back again on lisinopril HCTZ 10-12.5mg  BID.Marland Kitchen his blood pressure continued to be elevated therefore he was to increase to lisinopril HCTZ 20/12.5 mg twice a day however this is where the confusion set in. He had remaining  lisinopril 10 mg tablets at home therefore he started taking one in the morning with a lisinopril HCTZ given him a morning dose of 20/12.5 and evening dose of 10/12.5. He recently ran out of the lisinopril 10 mg and his blood pressure has elevated again.  Note he does not have a cuff at home, and also concerned coming into the office makes him anxious and his BP elevates Review of Systems  GEN- denies fatigue, fever, weight loss,weakness, recent illness HEENT- denies eye drainage, change in vision, +nasal discharge, CVS- denies chest pain, palpitations RESP- denies SOB, cough, wheeze ABD- denies N/V, change in stools, abd pain Neuro- denies headache, dizziness, syncope, seizure activity      Objective:   Physical Exam GEN- NAD, alert and oriented x3 HEENT- PERRL, EOMI, non injected sclera, pink conjunctiva, MMM, oropharynx clear, TM clear bilat no effusion, mild maxillary  sinus tenderness,+Nasal drainage  Neck- Supple, no LAD CVS- RRR, no murmur RESP-CTAB EXT- No edema Pulses- Radial 2+         Assessment & Plan:

## 2013-04-15 NOTE — Assessment & Plan Note (Signed)
Prolonged symptoms, treat with antibiotics, allow topical afrin x 2-3 days Mucinex

## 2013-04-16 LAB — BASIC METABOLIC PANEL
BUN: 23 mg/dL (ref 6–23)
CO2: 29 mEq/L (ref 19–32)
Glucose, Bld: 111 mg/dL — ABNORMAL HIGH (ref 70–99)
Potassium: 4.8 mEq/L (ref 3.5–5.3)
Sodium: 138 mEq/L (ref 135–145)

## 2013-04-23 ENCOUNTER — Encounter: Payer: Self-pay | Admitting: Family Medicine

## 2013-04-23 ENCOUNTER — Ambulatory Visit (INDEPENDENT_AMBULATORY_CARE_PROVIDER_SITE_OTHER): Payer: Medicare Other | Admitting: Family Medicine

## 2013-04-23 VITALS — BP 130/80 | HR 68 | Temp 97.4°F | Resp 18 | Wt 210.0 lb

## 2013-04-23 DIAGNOSIS — I10 Essential (primary) hypertension: Secondary | ICD-10-CM | POA: Diagnosis not present

## 2013-04-23 NOTE — Progress Notes (Signed)
  Subjective:    Patient ID: Jesse Larsson., male    DOB: 07/01/1935, 78 y.o.   MRN: [id number]  HPI Patient is on lisinopril/hydrochlorothiazide 10/12.5 2 tablets by mouth daily, lisinopril 10 mg by mouth daily, Lopressor 25 mg by mouth twice a day. His blood pressure has been ranging 1:30 to 140 over 80s. Last week he was 160 systolic. His blood pressure test for arrived he is in pain or if he is anxious. He is here today to followup his blood pressure. Past Medical History  Diagnosis Date  . Rectal pain   . Blood in stool   . Hemorrhoids   . Rectal fissure   . Hypertension   . Hyperlipidemia    Current Outpatient Prescriptions on File Prior to Visit  Medication Sig Dispense Refill  . aspirin 81 MG tablet Take 81 mg by mouth daily.        Marland Kitchen lisinopril (PRINIVIL,ZESTRIL) 10 MG tablet Take 1 tablet (10 mg total) by mouth daily.  30 tablet  3  . lisinopril-hydrochlorothiazide (PRINZIDE,ZESTORETIC) 10-12.5 MG per tablet Take 1 tablet by mouth 2 (two) times daily.  90 tablet  3  . metoprolol tartrate (LOPRESSOR) 25 MG tablet Take 1 tablet (25 mg total) by mouth 2 (two) times daily.  180 tablet  3  . simvastatin (ZOCOR) 20 MG tablet Take 20 mg by mouth at bedtime.        Marland Kitchen amoxicillin-clavulanate (AUGMENTIN) 875-125 MG per tablet Take 1 tablet by mouth 2 (two) times daily.  20 tablet  0   No current facility-administered medications on file prior to visit.   No Known Allergies History   Social History  . Marital Status: Widowed    Spouse Name: N/A    Number of Children: N/A  . Years of Education: N/A   Occupational History  . Not on file.   Social History Main Topics  . Smoking status: Never Smoker   . Smokeless tobacco: Never Used  . Alcohol Use: No  . Drug Use: No  . Sexual Activity: Not on file   Other Topics Concern  . Not on file   Social History Narrative  . No narrative on file      Review of Systems  All other systems reviewed and are negative.        Objective:   Physical Exam  Vitals reviewed. Cardiovascular: Normal rate, regular rhythm and normal heart sounds.   No murmur heard. Pulmonary/Chest: Effort normal and breath sounds normal. No respiratory distress. He has no wheezes. He has no rales.  Abdominal: Soft. Bowel sounds are normal. He exhibits no distension. There is no tenderness. There is no rebound.          Assessment & Plan:  1. HTN (hypertension) Blood pressures highly variable. I recommended the patient get a blood pressure cuff and monitor his blood pressure daily at home. In one to 2 weeks bring in values I can look at the average. The average is elevated I recommend adding amlodipine. I would also simplify his blood pressure medicine and put him on Zestoretic 20/12.5 one by mouth twice a day.  For the time being, I made no changes until I see his average blood pressures from home.

## 2013-04-30 ENCOUNTER — Other Ambulatory Visit: Payer: Self-pay | Admitting: Family Medicine

## 2013-05-05 DIAGNOSIS — D235 Other benign neoplasm of skin of trunk: Secondary | ICD-10-CM | POA: Diagnosis not present

## 2013-05-05 DIAGNOSIS — L570 Actinic keratosis: Secondary | ICD-10-CM | POA: Diagnosis not present

## 2013-05-12 ENCOUNTER — Telehealth: Payer: Self-pay | Admitting: Family Medicine

## 2013-05-12 NOTE — Telephone Encounter (Signed)
BP readings - 154/83 - p-80, 150/80 - p-73 132/81 - p-78, 133/80 - p-71, 129/74 - p-6, 131/75 - p-67, 135/73 - p-83, 133/82 - p-72  Per [facility] BP's look great no change in therapy.  .Patient aware

## 2013-06-22 DIAGNOSIS — N138 Other obstructive and reflux uropathy: Secondary | ICD-10-CM | POA: Diagnosis not present

## 2013-07-01 DIAGNOSIS — N401 Enlarged prostate with lower urinary tract symptoms: Secondary | ICD-10-CM | POA: Diagnosis not present

## 2013-07-01 DIAGNOSIS — N139 Obstructive and reflux uropathy, unspecified: Secondary | ICD-10-CM | POA: Diagnosis not present

## 2013-07-01 DIAGNOSIS — N411 Chronic prostatitis: Secondary | ICD-10-CM | POA: Diagnosis not present

## 2013-08-10 ENCOUNTER — Telehealth: Payer: Self-pay | Admitting: Family Medicine

## 2013-08-10 DIAGNOSIS — I10 Essential (primary) hypertension: Secondary | ICD-10-CM

## 2013-08-10 MED ORDER — LISINOPRIL-HYDROCHLOROTHIAZIDE 10-12.5 MG PO TABS: 1.0000 | ORAL_TABLET | Freq: Two times a day (BID) | ORAL | Status: DC

## 2013-08-10 NOTE — Telephone Encounter (Signed)
Medication refilled per protocol. 

## 2013-08-11 DIAGNOSIS — L570 Actinic keratosis: Secondary | ICD-10-CM | POA: Diagnosis not present

## 2013-08-15 ENCOUNTER — Other Ambulatory Visit: Payer: Self-pay | Admitting: Family Medicine

## 2013-08-15 DIAGNOSIS — I10 Essential (primary) hypertension: Secondary | ICD-10-CM

## 2013-08-15 MED ORDER — LISINOPRIL-HYDROCHLOROTHIAZIDE 10-12.5 MG PO TABS: 1.0000 | ORAL_TABLET | Freq: Two times a day (BID) | ORAL | Status: DC

## 2013-08-15 NOTE — Telephone Encounter (Signed)
Rx Refilled  

## 2013-10-22 ENCOUNTER — Encounter: Payer: Self-pay | Admitting: Family Medicine

## 2013-10-22 ENCOUNTER — Other Ambulatory Visit: Payer: Self-pay | Admitting: Family Medicine

## 2013-10-22 NOTE — Telephone Encounter (Signed)
Medication refill for one time only.  Patient needs to be seen.  Letter sent for patient to call and schedule 

## 2013-10-30 ENCOUNTER — Other Ambulatory Visit: Payer: Medicare Other

## 2013-10-30 ENCOUNTER — Ambulatory Visit (INDEPENDENT_AMBULATORY_CARE_PROVIDER_SITE_OTHER): Payer: Medicare Other | Admitting: Family Medicine

## 2013-10-30 ENCOUNTER — Encounter: Payer: Self-pay | Admitting: Family Medicine

## 2013-10-30 VITALS — BP 126/80 | HR 68 | Temp 96.6°F | Resp 16 | Ht 74.0 in | Wt 206.0 lb

## 2013-10-30 DIAGNOSIS — Z23 Encounter for immunization: Secondary | ICD-10-CM | POA: Diagnosis not present

## 2013-10-30 DIAGNOSIS — E785 Hyperlipidemia, unspecified: Secondary | ICD-10-CM

## 2013-10-30 DIAGNOSIS — Z0000 Encounter for general adult medical examination without abnormal findings: Secondary | ICD-10-CM | POA: Diagnosis not present

## 2013-10-30 DIAGNOSIS — I10 Essential (primary) hypertension: Secondary | ICD-10-CM | POA: Diagnosis not present

## 2013-10-30 DIAGNOSIS — Z79899 Other long term (current) drug therapy: Secondary | ICD-10-CM

## 2013-10-30 LAB — LIPID PANEL
CHOLESTEROL: 119 mg/dL (ref 0–200)
HDL: 44 mg/dL (ref >39)
LDL CALC: 50 mg/dL (ref 0–99)
TRIGLYCERIDES: 127 mg/dL (ref <150)
Total CHOL/HDL Ratio: 2.7 Ratio
VLDL: 25 mg/dL (ref 0–40)

## 2013-10-30 LAB — COMPLETE METABOLIC PANEL WITH GFR
ALK PHOS: 50 U/L (ref 39–117)
ALT: 19 U/L (ref 0–53)
AST: 20 U/L (ref 0–37)
Albumin: 4 g/dL (ref 3.5–5.2)
BUN: 18 mg/dL (ref 6–23)
CO2: 28 mEq/L (ref 19–32)
CREATININE: 1.01 mg/dL (ref 0.50–1.35)
Calcium: 9.3 mg/dL (ref 8.4–10.5)
Chloride: 102 mEq/L (ref 96–112)
GFR, EST NON AFRICAN AMERICAN: 71 mL/min
GFR, Est African American: 83 mL/min
GLUCOSE: 107 mg/dL — AB (ref 70–99)
Potassium: 4.7 mEq/L (ref 3.5–5.3)
Sodium: 140 mEq/L (ref 135–145)
Total Bilirubin: 0.7 mg/dL (ref 0.2–1.2)
Total Protein: 6.4 g/dL (ref 6.0–8.3)

## 2013-10-30 LAB — CBC WITH DIFFERENTIAL/PLATELET
BASOS ABS: 0.1 10*3/uL (ref 0.0–0.1)
BASOS PCT: 1 % (ref 0–1)
EOS ABS: 0.2 10*3/uL (ref 0.0–0.7)
EOS PCT: 3 % (ref 0–5)
HEMATOCRIT: 43.3 % (ref 39.0–52.0)
Hemoglobin: 14.7 g/dL (ref 13.0–17.0)
Lymphocytes Relative: 27 % (ref 12–46)
Lymphs Abs: 1.7 10*3/uL (ref 0.7–4.0)
MCH: 28.8 pg (ref 26.0–34.0)
MCHC: 33.9 g/dL (ref 30.0–36.0)
MCV: 84.7 fL (ref 78.0–100.0)
MONO ABS: 0.4 10*3/uL (ref 0.1–1.0)
Monocytes Relative: 7 % (ref 3–12)
Neutro Abs: 4 10*3/uL (ref 1.7–7.7)
Neutrophils Relative %: 62 % (ref 43–77)
Platelets: 258 10*3/uL (ref 150–400)
RBC: 5.11 MIL/uL (ref 4.22–5.81)
RDW: 13.8 % (ref 11.5–15.5)
WBC: 6.4 10*3/uL (ref 4.0–10.5)

## 2013-10-30 NOTE — Progress Notes (Signed)
   Subjective:    Patient ID: Jesse Fletcher., male    DOB: 1935/05/15, 79 y.o.   MRN: [id number]  HPI  Is coming in today for a blood pressure check as well as a cholesterol check. History on lisinopril/HCTZ 10/12.52 tablets by mouth daily and Lopressor 25 mg by mouth twice a day. He denies any chest pain, shortness of breath, dyspnea on exertion. His blood pressure is well controlled at home ranging 125-135/70-80. He is also taking simvastatin 20 mg by mouth daily for hyperlipidemia. He denies any myalgias or right upper quadrant pain. Past Medical History  Diagnosis Date  . Rectal pain   . Blood in stool   . Hemorrhoids   . Rectal fissure   . Hypertension   . Hyperlipidemia    Current Outpatient Prescriptions on File Prior to Visit  Medication Sig Dispense Refill  . aspirin 81 MG tablet Take 81 mg by mouth daily.        Marland Kitchen lisinopril-hydrochlorothiazide (PRINZIDE,ZESTORETIC) 10-12.5 MG per tablet Take 1 tablet by mouth 2 (two) times daily.  180 tablet  3  . metoprolol tartrate (LOPRESSOR) 25 MG tablet Take 1 tablet (25 mg total) by mouth 2 (two) times daily.  180 tablet  3  . simvastatin (ZOCOR) 20 MG tablet TAKE ONE TABLET BY MOUTH AT BEDTIME  30 tablet  0   No current facility-administered medications on file prior to visit.   No Known Allergies History   Social History  . Marital Status: Widowed    Spouse Name: N/A    Number of Children: N/A  . Years of Education: N/A   Occupational History  . Not on file.   Social History Main Topics  . Smoking status: Never Smoker   . Smokeless tobacco: Never Used  . Alcohol Use: No  . Drug Use: No  . Sexual Activity: Not on file   Other Topics Concern  . Not on file   Social History Narrative  . No narrative on file     Review of Systems  All other systems reviewed and are negative.      Objective:   Physical Exam  Vitals reviewed. Cardiovascular: Normal rate, regular rhythm and normal heart sounds.   No murmur  heard. Pulmonary/Chest: Effort normal and breath sounds normal. No respiratory distress. He has no wheezes. He has no rales.  Abdominal: Soft. Bowel sounds are normal. He exhibits no distension. There is no tenderness. There is no rebound and no guarding.  Musculoskeletal: He exhibits no edema.          Assessment & Plan:  1. Hypertension Blood pressure is well-controlled. Continue current medications at the present dosages the  2. Hyperlipidemia Check a CMP and fasting lipid panel.  The LDL cholesterol less than 100 and  3. Need for prophylactic vaccination and inoculation against unspecified single disease Patient is due for Prevnar 13. - Pneumococcal conjugate vaccine 13-valent IM

## 2013-11-10 DIAGNOSIS — L570 Actinic keratosis: Secondary | ICD-10-CM | POA: Diagnosis not present

## 2013-11-10 DIAGNOSIS — B079 Viral wart, unspecified: Secondary | ICD-10-CM | POA: Diagnosis not present

## 2013-11-20 ENCOUNTER — Encounter: Payer: Self-pay | Admitting: Family Medicine

## 2013-11-20 ENCOUNTER — Ambulatory Visit (INDEPENDENT_AMBULATORY_CARE_PROVIDER_SITE_OTHER): Payer: Medicare Other | Admitting: Family Medicine

## 2013-11-20 VITALS — BP 110/68 | HR 64 | Temp 96.6°F | Resp 14 | Ht 74.0 in | Wt 202.0 lb

## 2013-11-20 DIAGNOSIS — A499 Bacterial infection, unspecified: Secondary | ICD-10-CM

## 2013-11-20 DIAGNOSIS — B9689 Other specified bacterial agents as the cause of diseases classified elsewhere: Secondary | ICD-10-CM | POA: Diagnosis not present

## 2013-11-20 DIAGNOSIS — J329 Chronic sinusitis, unspecified: Secondary | ICD-10-CM | POA: Diagnosis not present

## 2013-11-20 MED ORDER — AMOXICILLIN 875 MG PO TABS: 875.0000 mg | ORAL_TABLET | Freq: Two times a day (BID) | ORAL | Status: DC

## 2013-11-20 NOTE — Progress Notes (Signed)
   Subjective:    Patient ID: Jesse Fletcher., male    DOB: 05/01/1935, 79 y.o.   MRN: [id number]  HPI Synthroid sinus pressure and drainage for over 2 weeks. He denies any fevers or chills. He does report headaches. He reports constant postnasal drip and occasional cough. He denies any shortness of breath or chest pain. He has tried over-the-counter allergy medicine and Coricidin HBP without benefit. He has also tried nasal steroid sprays without benefit. Past Medical History  Diagnosis Date  . Rectal pain   . Blood in stool   . Hemorrhoids   . Rectal fissure   . Hypertension   . Hyperlipidemia    Current Outpatient Prescriptions on File Prior to Visit  Medication Sig Dispense Refill  . aspirin 81 MG tablet Take 81 mg by mouth daily.        Marland Kitchen lisinopril-hydrochlorothiazide (PRINZIDE,ZESTORETIC) 10-12.5 MG per tablet Take 1 tablet by mouth 2 (two) times daily.  180 tablet  3  . metoprolol tartrate (LOPRESSOR) 25 MG tablet Take 1 tablet (25 mg total) by mouth 2 (two) times daily.  180 tablet  3  . simvastatin (ZOCOR) 20 MG tablet TAKE ONE TABLET BY MOUTH AT BEDTIME  30 tablet  0   No current facility-administered medications on file prior to visit.   No Known Allergies History   Social History  . Marital Status: Widowed    Spouse Name: N/A    Number of Children: N/A  . Years of Education: N/A   Occupational History  . Not on file.   Social History Main Topics  . Smoking status: Never Smoker   . Smokeless tobacco: Never Used  . Alcohol Use: No  . Drug Use: No  . Sexual Activity: Not on file   Other Topics Concern  . Not on file   Social History Narrative  . No narrative on file      Review of Systems  All other systems reviewed and are negative.      Objective:   Physical Exam  Vitals reviewed. Constitutional: He appears well-developed and well-nourished. No distress.  HENT:  Right Ear: External ear normal.  Left Ear: External ear normal.  Nose: Mucosal  edema and rhinorrhea present.  Mouth/Throat: Oropharynx is clear and moist. No oropharyngeal exudate.  Eyes: Conjunctivae are normal. Pupils are equal, round, and reactive to light. No scleral icterus.  Neck: Neck supple.  Cardiovascular: Normal rate, regular rhythm and normal heart sounds.   No murmur heard. Pulmonary/Chest: Effort normal and breath sounds normal. No respiratory distress. He has no wheezes. He has no rales.  Lymphadenopathy:    He has no cervical adenopathy.  Skin: He is not diaphoretic.          Assessment & Plan:  1. Sinusitis, bacterial - amoxicillin (AMOXIL) 875 MG tablet; Take 1 tablet (875 mg total) by mouth 2 (two) times daily.  Dispense: 20 tablet; Refill: 0

## 2013-11-25 ENCOUNTER — Encounter (INDEPENDENT_AMBULATORY_CARE_PROVIDER_SITE_OTHER): Payer: Self-pay | Admitting: General Surgery

## 2013-11-25 ENCOUNTER — Ambulatory Visit (INDEPENDENT_AMBULATORY_CARE_PROVIDER_SITE_OTHER): Payer: Medicare Other | Admitting: General Surgery

## 2013-11-25 VITALS — BP 122/72 | HR 73 | Temp 98.4°F | Ht 73.0 in | Wt 200.0 lb

## 2013-11-25 DIAGNOSIS — K625 Hemorrhage of anus and rectum: Secondary | ICD-10-CM | POA: Insufficient documentation

## 2013-11-25 NOTE — Patient Instructions (Signed)
Call if you have another event. If you cannot be seen by me within 48 hours, you should be able to be seen in the urgent office here.

## 2013-11-25 NOTE — Progress Notes (Signed)
Subjective:     Patient ID: Jesse Doyne., male   DOB: [birth year], 79 y.o.   MRN: [id number]  HPI  He is well known to me. He has had a number of acute anal fissures that have responded to medical treatment. 2 weeks ago, he had a forceful loose BM that was associated with some bright red blood and discomfort. It felt like his usual anal fissure. The bleeding only lasted one day and resolved. He currently is asymptomatic.   Review of SystemsNo constipation. Takes a stool softer daily.     Objective:   Physical Exam Gen.-well-developed, well-nourished, in no acute distress.  Anorectal-no external lesions, no visible fissure, no palpable mass or blood on digital rectal exam.  Anoscopy demonstrates no fissures, no masses, small right posterior internal hemorrhoid.    Assessment:     Transient rectal bleeding with pain consistent with previous anal fissure symptoms. He could also had a bleeding internal hemorrhoid. Currently asymptomatic and there are no clinical findings at this time.     Plan:     I told him to call if he had another event. I would try to see him within 48 hours or he could be placed in the urgent office and be seen.

## 2013-11-27 ENCOUNTER — Telehealth: Payer: Self-pay | Admitting: Family Medicine

## 2013-11-27 MED ORDER — SIMVASTATIN 20 MG PO TABS: ORAL_TABLET | ORAL | Status: DC

## 2013-11-27 NOTE — Telephone Encounter (Signed)
Jesse Fletcher Pt is needing a refill on simvastatin (ZOCOR) 20 MG tablet

## 2013-11-27 NOTE — Telephone Encounter (Signed)
Rx Refilled  

## 2013-12-23 ENCOUNTER — Encounter (INDEPENDENT_AMBULATORY_CARE_PROVIDER_SITE_OTHER): Payer: Self-pay | Admitting: General Surgery

## 2013-12-23 ENCOUNTER — Ambulatory Visit (INDEPENDENT_AMBULATORY_CARE_PROVIDER_SITE_OTHER): Payer: Medicare Other | Admitting: General Surgery

## 2013-12-23 VITALS — BP 122/70 | HR 72 | Resp 16 | Ht 74.0 in | Wt 199.6 lb

## 2013-12-23 DIAGNOSIS — K602 Anal fissure, unspecified: Secondary | ICD-10-CM | POA: Diagnosis not present

## 2013-12-23 NOTE — Patient Instructions (Signed)
Stop using a rag to clean yourself. Use very soft tissue paper and warm water. Increase fluid intake until your urine is clear.

## 2013-12-23 NOTE — Progress Notes (Signed)
Subjective:     Patient ID: Jesse Fletcher., male   DOB: [birth year], 79 y.o.   MRN: [id number]  HPIHe presents today because he had a firm bowel movement and some pain and bleeding from what he thinks is a recurrent fissure. He has had acute fissures off-and-on for about 30 years.  He often cleans himself with a rag after having a bowel movement.   Review of SystemsTake stool softeners twice a day.     Objective:   Physical Exam General-he looks well and is in no acute distress.  Anorectal-at the 3:00 position there is a very small shallow acute fissure that bleeds when touched.    Assessment:     Acute anal fissure 3:00 position secondary to hard stool. Also, I believe he cleans himself over vigorously.     Plan:     Use of soft tissue paper only. Increase water intake to avoid dehydrated, hard stool. Continue stool softeners. Return visit as needed.

## 2014-01-25 ENCOUNTER — Other Ambulatory Visit: Payer: Self-pay | Admitting: Family Medicine

## 2014-03-29 ENCOUNTER — Ambulatory Visit (INDEPENDENT_AMBULATORY_CARE_PROVIDER_SITE_OTHER): Payer: Medicare Other | Admitting: *Deleted

## 2014-03-29 DIAGNOSIS — Z23 Encounter for immunization: Secondary | ICD-10-CM

## 2014-04-07 ENCOUNTER — Ambulatory Visit (INDEPENDENT_AMBULATORY_CARE_PROVIDER_SITE_OTHER): Payer: Medicare Other | Admitting: Family Medicine

## 2014-04-07 ENCOUNTER — Encounter: Payer: Self-pay | Admitting: Family Medicine

## 2014-04-07 VITALS — BP 136/76 | HR 78 | Temp 98.2°F | Resp 16 | Ht 74.0 in | Wt 201.0 lb

## 2014-04-07 DIAGNOSIS — J3089 Other allergic rhinitis: Secondary | ICD-10-CM

## 2014-04-07 DIAGNOSIS — J069 Acute upper respiratory infection, unspecified: Secondary | ICD-10-CM

## 2014-04-07 DIAGNOSIS — H6121 Impacted cerumen, right ear: Secondary | ICD-10-CM | POA: Diagnosis not present

## 2014-04-07 MED ORDER — FLUTICASONE PROPIONATE 50 MCG/ACT NA SUSP: 2.0000 | Freq: Every day | NASAL | Status: DC

## 2014-04-07 NOTE — Progress Notes (Signed)
Patient ID: Jesse Doyne., male   DOB: 04-20-1935, 79 y.o.   MRN: [id number]   Subjective:    Patient ID: Jesse Doyne., male    DOB: 27-Aug-1934, 79 y.o.   MRN: [id number]  Patient presents for Illness  patient here with 2 days of worsening rhinitis. Initially started with a sore throat that resolved. He's not had any fever or no sinus pressure. He has minimal cough mostly from postnasal drip. He does have some fall allergies but states that he always gets a sore throat and runny nose when he gets a cold. He has been taking over-the-counter Coricidin with minimal improvement    Review Of Systems:  GEN- denies fatigue, fever, weight loss,weakness, recent illness HEENT- denies eye drainage, change in vision, nasal discharge, CVS- denies chest pain, palpitations RESP- denies SOB, +cough, wheeze ABD- denies N/V, change in stools, abd pain Neuro- denies headache, dizziness, syncope, seizure activity       Objective:    BP 136/76  Pulse 78  Temp(Src) 98.2 F (36.8 C) (Oral)  Resp 16  Ht 6\' 2"  (1.88 m)  Wt 201 lb (91.173 kg)  BMI 25.80 kg/m2 GEN- NAD, alert and oriented x3 HEENT- PERRL, EOMI, non injected sclera, pink conjunctiva, MMM, oropharynx clear, nares clear rhinorrhea, enlarged left nasal turbinate, no ulcerations or blood noted, Right TM-occluded by wax , canal clear, s/p removal of wax, TM clear no effusion, left TM clear no effusion, canl clear Neck- Supple, no  LAD CVS- RRR, no murmur RESP-CTAB Ext- no edema        Assessment & Plan:      Problem List Items Addressed This Visit   None    Visit Diagnoses   Acute URI    -  Primary    supportive care, OTC cough med, if he worsens add Zpak    Other allergic rhinitis        Rhinitis due to viral illnss and fall allergens- treat with flonsae and nasal saline    Cerumen impaction, right        IRRIGATED AT BEDSIDE       Note: This dictation was prepared with Dragon dictation along with smaller phrase  technology. Any transcriptional errors that result from this process are unintentional.

## 2014-04-07 NOTE — Patient Instructions (Signed)
Use nasal steroid, alternate with nasal saline as needed COntinue coricidin Call if not improving F/U as needed

## 2014-04-08 ENCOUNTER — Ambulatory Visit: Payer: Medicare Other | Admitting: Family Medicine

## 2014-04-09 ENCOUNTER — Telehealth: Payer: Self-pay | Admitting: *Deleted

## 2014-04-09 MED ORDER — AMOXICILLIN 875 MG PO TABS: 875.0000 mg | ORAL_TABLET | Freq: Two times a day (BID) | ORAL | Status: DC

## 2014-04-09 NOTE — Telephone Encounter (Signed)
Pt states was here on Wednesday with URI and states he stay isnt feeling better and wants to know if you can something in for him. HE says he has a bottle of amoxicillin that he would like to have refilled that was given to him months ago. MD please advise!  [place]

## 2014-04-09 NOTE — Telephone Encounter (Signed)
Script sent to pts pharmacy and pt aware of instructions

## 2014-04-09 NOTE — Telephone Encounter (Signed)
Send in amox 875mg   1 po BID x 10 days

## 2014-05-08 ENCOUNTER — Other Ambulatory Visit: Payer: Self-pay | Admitting: Family Medicine

## 2014-05-10 ENCOUNTER — Telehealth: Payer: Self-pay | Admitting: Family Medicine

## 2014-05-10 NOTE — Telephone Encounter (Signed)
Pt wants to know why he needs to be seen for med refill.  Simvastatin refilled only for one month.

## 2014-05-12 NOTE — Telephone Encounter (Signed)
Pt has not had bw since 11/13/13 and needs to been seen with bw every 6 months. Pt aware via vm.

## 2014-05-17 ENCOUNTER — Other Ambulatory Visit: Payer: Self-pay | Admitting: Family Medicine

## 2014-05-17 DIAGNOSIS — I10 Essential (primary) hypertension: Secondary | ICD-10-CM

## 2014-05-17 DIAGNOSIS — R739 Hyperglycemia, unspecified: Secondary | ICD-10-CM

## 2014-05-17 DIAGNOSIS — E785 Hyperlipidemia, unspecified: Secondary | ICD-10-CM

## 2014-05-21 ENCOUNTER — Other Ambulatory Visit: Payer: Medicare Other

## 2014-05-21 DIAGNOSIS — E785 Hyperlipidemia, unspecified: Secondary | ICD-10-CM

## 2014-05-21 DIAGNOSIS — R739 Hyperglycemia, unspecified: Secondary | ICD-10-CM

## 2014-05-21 DIAGNOSIS — I10 Essential (primary) hypertension: Secondary | ICD-10-CM | POA: Diagnosis not present

## 2014-05-21 DIAGNOSIS — R7309 Other abnormal glucose: Secondary | ICD-10-CM | POA: Diagnosis not present

## 2014-05-21 LAB — COMPLETE METABOLIC PANEL WITH GFR
ALT: 19 U/L (ref 0–53)
AST: 23 U/L (ref 0–37)
Albumin: 4.1 g/dL (ref 3.5–5.2)
Alkaline Phosphatase: 55 U/L (ref 39–117)
BILIRUBIN TOTAL: 0.6 mg/dL (ref 0.2–1.2)
BUN: 28 mg/dL — ABNORMAL HIGH (ref 6–23)
CHLORIDE: 104 meq/L (ref 96–112)
CO2: 29 mEq/L (ref 19–32)
Calcium: 9.5 mg/dL (ref 8.4–10.5)
Creat: 1.12 mg/dL (ref 0.50–1.35)
GFR, Est African American: 73 mL/min
GFR, Est Non African American: 63 mL/min
GLUCOSE: 105 mg/dL — AB (ref 70–99)
Potassium: 5.1 mEq/L (ref 3.5–5.3)
SODIUM: 140 meq/L (ref 135–145)
TOTAL PROTEIN: 6.4 g/dL (ref 6.0–8.3)

## 2014-05-21 LAB — LIPID PANEL
CHOLESTEROL: 138 mg/dL (ref 0–200)
HDL: 48 mg/dL (ref >39)
LDL Cholesterol: 70 mg/dL (ref 0–99)
TRIGLYCERIDES: 100 mg/dL (ref <150)
Total CHOL/HDL Ratio: 2.9 Ratio
VLDL: 20 mg/dL (ref 0–40)

## 2014-05-21 LAB — HEMOGLOBIN A1C
Hgb A1c MFr Bld: 5.9 % — ABNORMAL HIGH (ref <5.7)
MEAN PLASMA GLUCOSE: 123 mg/dL — AB (ref <117)

## 2014-05-25 DIAGNOSIS — L570 Actinic keratosis: Secondary | ICD-10-CM | POA: Diagnosis not present

## 2014-06-01 ENCOUNTER — Ambulatory Visit (INDEPENDENT_AMBULATORY_CARE_PROVIDER_SITE_OTHER): Payer: Medicare Other | Admitting: Family Medicine

## 2014-06-01 ENCOUNTER — Encounter: Payer: Self-pay | Admitting: Family Medicine

## 2014-06-01 VITALS — BP 122/74 | HR 68 | Temp 97.4°F | Resp 16 | Ht 74.0 in | Wt 200.0 lb

## 2014-06-01 DIAGNOSIS — I10 Essential (primary) hypertension: Secondary | ICD-10-CM | POA: Diagnosis not present

## 2014-06-01 DIAGNOSIS — E785 Hyperlipidemia, unspecified: Secondary | ICD-10-CM | POA: Diagnosis not present

## 2014-06-01 NOTE — Progress Notes (Signed)
Subjective:    Patient ID: Jesse Doyne., male    DOB: Sep 18, 1934, 79 y.o.   MRN: [id number]  HPI  Patient has a history of hypertension and hyperlipidemia. He is currently on Zestoretic 10/12.5 one by mouth daily and Lopressor 25 mg one by mouth twice a day. He denies any chest pain shortness of breath or dyspnea on exertion. So currently taking simvastatin 20 mg by mouth daily. He denies any myalgias or right upper quadrant pain. His flu shot, Pneumovax, and Prevnar are all up-to-date. His most recent lab work as listed below: Appointment on 05/21/2014  Component Date Value Ref Range Status  . Cholesterol 05/21/2014 138  0 - 200 mg/dL Final   Comment: ATP III Classification:       < 200        mg/dL        Desirable      200 - 239     mg/dL        Borderline High      >= 240        mg/dL        High     . Triglycerides 05/21/2014 100  <150 mg/dL Final  . HDL 05/21/2014 48  >39 mg/dL Final  . Total CHOL/HDL Ratio 05/21/2014 2.9   Final  . VLDL 05/21/2014 20  0 - 40 mg/dL Final  . LDL Cholesterol 05/21/2014 70  0 - 99 mg/dL Final   Comment:   Total Cholesterol/HDL Ratio:CHD Risk                        Coronary Heart Disease Risk Table                                        Men       Women          1/2 Average Risk              3.4        3.3              Average Risk              5.0        4.4           2X Average Risk              9.6        7.1           3X Average Risk             23.4       11.0 Use the calculated Patient Ratio above and the CHD Risk table  to determine the patient's CHD Risk. ATP III Classification (LDL):       < 100        mg/dL         Optimal      100 - 129     mg/dL         Near or Above Optimal      130 - 159     mg/dL         Borderline High      160 - 189     mg/dL         High       > 190  mg/dL         Very High     . Hgb A1c MFr Bld 05/21/2014 5.9* <5.7 % Final   Comment:                                                                         According to the ADA Clinical Practice Recommendations for 2011, when HbA1c is used as a screening test:     >=6.5%   Diagnostic of Diabetes Mellitus            (if abnormal result is confirmed)   5.7-6.4%   Increased risk of developing Diabetes Mellitus   References:Diagnosis and Classification of Diabetes Mellitus,Diabetes EHUD,1497,02(OVZCH 1):S62-S69 and Standards of Medical Care in         Diabetes - 2011,Diabetes YIFO,2774,12 (Suppl 1):S11-S61.     . Mean Plasma Glucose 05/21/2014 123* <117 mg/dL Final  . Sodium 05/21/2014 140  135 - 145 mEq/L Final  . Potassium 05/21/2014 5.1  3.5 - 5.3 mEq/L Final  . Chloride 05/21/2014 104  96 - 112 mEq/L Final  . CO2 05/21/2014 29  19 - 32 mEq/L Final  . Glucose, Bld 05/21/2014 105* 70 - 99 mg/dL Final  . BUN 05/21/2014 28* 6 - 23 mg/dL Final  . Creat 05/21/2014 1.12  0.50 - 1.35 mg/dL Final  . Total Bilirubin 05/21/2014 0.6  0.2 - 1.2 mg/dL Final  . Alkaline Phosphatase 05/21/2014 55  39 - 117 U/L Final  . AST 05/21/2014 23  0 - 37 U/L Final  . ALT 05/21/2014 19  0 - 53 U/L Final  . Total Protein 05/21/2014 6.4  6.0 - 8.3 g/dL Final  . Albumin 05/21/2014 4.1  3.5 - 5.2 g/dL Final  . Calcium 05/21/2014 9.5  8.4 - 10.5 mg/dL Final  . GFR, Est African American 05/21/2014 73   Final  . GFR, Est Non African American 05/21/2014 63   Final   Comment:   The estimated GFR is a calculation valid for adults (>=36 years old) that uses the CKD-EPI algorithm to adjust for age and sex. It is   not to be used for children, pregnant women, hospitalized patients,    patients on dialysis, or with rapidly changing kidney function. According to the NKDEP, eGFR >89 is normal, 60-89 shows mild impairment, 30-59 shows moderate impairment, 15-29 shows severe impairment and <15 is ESRD.      Past Medical History  Diagnosis Date  . Rectal pain   . Blood in stool   . Hemorrhoids   . Rectal fissure   . Hypertension   . Hyperlipidemia     Past Surgical History  Procedure Laterality Date  . Hernia repair  2000 (approx)  . Shoulder surgery  1997    both  . Lower back surgery  05/1995, 05/1998   Current Outpatient Prescriptions on File Prior to Visit  Medication Sig Dispense Refill  . aspirin 81 MG tablet Take 81 mg by mouth daily.      Marland Kitchen lisinopril-hydrochlorothiazide (PRINZIDE,ZESTORETIC) 10-12.5 MG per tablet Take 1 tablet by mouth 2 (two) times daily. 180 tablet 3  . metoprolol tartrate (LOPRESSOR) 25 MG tablet TAKE ONE TABLET BY MOUTH TWICE DAILY 180 tablet 3  .  Multiple Vitamins-Minerals (CENTRUM SILVER ADULT 50+ PO) Take 1 tablet by mouth daily.    . simvastatin (ZOCOR) 20 MG tablet TAKE ONE TABLET BY MOUTH AT BEDTIME 30 tablet 0  . fluticasone (FLONASE) 50 MCG/ACT nasal spray Place 2 sprays into both nostrils daily. (Patient not taking: Reported on 06/01/2014) 16 g 1   No current facility-administered medications on file prior to visit.   No Known Allergies History   Social History  . Marital Status: Widowed    Spouse Name: N/A    Number of Children: N/A  . Years of Education: N/A   Occupational History  . Not on file.   Social History Main Topics  . Smoking status: Never Smoker   . Smokeless tobacco: Never Used  . Alcohol Use: No  . Drug Use: No  . Sexual Activity: Not on file   Other Topics Concern  . Not on file   Social History Narrative     Review of Systems  All other systems reviewed and are negative.      Objective:   Physical Exam  Cardiovascular: Normal rate, regular rhythm and normal heart sounds.   Pulmonary/Chest: Effort normal and breath sounds normal. No respiratory distress. He has no wheezes. He has no rales. He exhibits no tenderness.  Abdominal: Soft. Bowel sounds are normal. He exhibits no distension and no mass. There is no tenderness. There is no rebound and no guarding.  Musculoskeletal: He exhibits no edema.  Vitals reviewed.         Assessment & Plan:   Essential hypertension  Hyperlipidemia  Patient's blood pressures well controlled. His cholesterol is acceptable. We did spend 10 minutes discussing a low carbohydrate diet given his hyperglycemia. He is already exercising on a regular basis. He will try to decrease the starches and carbs in his diet. Recheck in 6 months.

## 2014-06-08 ENCOUNTER — Other Ambulatory Visit: Payer: Self-pay | Admitting: *Deleted

## 2014-06-08 MED ORDER — SIMVASTATIN 20 MG PO TABS: 20.0000 mg | ORAL_TABLET | Freq: Every day | ORAL | Status: DC

## 2014-06-08 NOTE — Telephone Encounter (Signed)
Received call from patient.   Reports that he requires new prescription for Zocor for 2 month supply with 3 refills.   Prescription sent to pharmacy.

## 2014-07-23 ENCOUNTER — Ambulatory Visit: Payer: Medicare Other | Admitting: Urology

## 2014-07-29 DIAGNOSIS — N401 Enlarged prostate with lower urinary tract symptoms: Secondary | ICD-10-CM | POA: Diagnosis not present

## 2014-07-29 DIAGNOSIS — N411 Chronic prostatitis: Secondary | ICD-10-CM | POA: Diagnosis not present

## 2014-07-29 DIAGNOSIS — R339 Retention of urine, unspecified: Secondary | ICD-10-CM | POA: Diagnosis not present

## 2014-08-07 ENCOUNTER — Other Ambulatory Visit: Payer: Self-pay | Admitting: Family Medicine

## 2014-08-24 DIAGNOSIS — L570 Actinic keratosis: Secondary | ICD-10-CM | POA: Diagnosis not present

## 2014-08-24 DIAGNOSIS — L84 Corns and callosities: Secondary | ICD-10-CM | POA: Diagnosis not present

## 2014-08-24 DIAGNOSIS — B070 Plantar wart: Secondary | ICD-10-CM | POA: Diagnosis not present

## 2014-10-05 DIAGNOSIS — L570 Actinic keratosis: Secondary | ICD-10-CM | POA: Diagnosis not present

## 2014-10-05 DIAGNOSIS — L84 Corns and callosities: Secondary | ICD-10-CM | POA: Diagnosis not present

## 2014-12-03 ENCOUNTER — Other Ambulatory Visit: Payer: Self-pay | Admitting: *Deleted

## 2014-12-03 DIAGNOSIS — I10 Essential (primary) hypertension: Secondary | ICD-10-CM

## 2014-12-03 DIAGNOSIS — E119 Type 2 diabetes mellitus without complications: Secondary | ICD-10-CM

## 2014-12-10 ENCOUNTER — Other Ambulatory Visit: Payer: Medicare Other

## 2014-12-10 DIAGNOSIS — E119 Type 2 diabetes mellitus without complications: Secondary | ICD-10-CM | POA: Diagnosis not present

## 2014-12-10 DIAGNOSIS — I10 Essential (primary) hypertension: Secondary | ICD-10-CM | POA: Diagnosis not present

## 2014-12-10 LAB — LIPID PANEL
CHOLESTEROL: 129 mg/dL (ref 0–200)
HDL: 48 mg/dL (ref >=40)
LDL Cholesterol: 61 mg/dL (ref 0–99)
Total CHOL/HDL Ratio: 2.7 Ratio
Triglycerides: 99 mg/dL (ref <150)
VLDL: 20 mg/dL (ref 0–40)

## 2014-12-10 LAB — COMPLETE METABOLIC PANEL WITH GFR
ALT: 18 U/L (ref 0–53)
AST: 19 U/L (ref 0–37)
Albumin: 3.7 g/dL (ref 3.5–5.2)
Alkaline Phosphatase: 65 U/L (ref 39–117)
BUN: 22 mg/dL (ref 6–23)
CO2: 28 meq/L (ref 19–32)
CREATININE: 1.11 mg/dL (ref 0.50–1.35)
Calcium: 9.2 mg/dL (ref 8.4–10.5)
Chloride: 106 mEq/L (ref 96–112)
GFR, EST AFRICAN AMERICAN: 73 mL/min
GFR, Est Non African American: 63 mL/min
GLUCOSE: 95 mg/dL (ref 70–99)
Potassium: 4.8 mEq/L (ref 3.5–5.3)
SODIUM: 140 meq/L (ref 135–145)
Total Bilirubin: 0.6 mg/dL (ref 0.2–1.2)
Total Protein: 5.9 g/dL — ABNORMAL LOW (ref 6.0–8.3)

## 2014-12-10 LAB — HEMOGLOBIN A1C
HEMOGLOBIN A1C: 5.7 % — AB (ref <5.7)
MEAN PLASMA GLUCOSE: 117 mg/dL — AB (ref <117)

## 2014-12-14 ENCOUNTER — Encounter: Payer: Self-pay | Admitting: Family Medicine

## 2014-12-14 ENCOUNTER — Ambulatory Visit (INDEPENDENT_AMBULATORY_CARE_PROVIDER_SITE_OTHER): Payer: Medicare Other | Admitting: Family Medicine

## 2014-12-14 VITALS — BP 110/64 | HR 72 | Temp 97.9°F | Resp 18 | Ht 74.0 in | Wt 196.0 lb

## 2014-12-14 DIAGNOSIS — I10 Essential (primary) hypertension: Secondary | ICD-10-CM

## 2014-12-14 DIAGNOSIS — D485 Neoplasm of uncertain behavior of skin: Secondary | ICD-10-CM

## 2014-12-14 DIAGNOSIS — E785 Hyperlipidemia, unspecified: Secondary | ICD-10-CM | POA: Diagnosis not present

## 2014-12-14 NOTE — Progress Notes (Signed)
Subjective:    Patient ID: Jesse Doyne., male    DOB: [birth year], 80 y.o.   MRN: [id number]  HPI Patient is here today for follow-up of his medical conditions. He has a history of hypertension and hyperlipidemia. His blood pressure today is well controlled at 110/64. He denies any chest pain shortness of breath or dyspnea on exertion. He denies any myalgias or right upper quadrant pain on his statin medication. He has lost 4 pounds since his last office visit by trying to decrease carbohydrates. His blood work shows evidence of this is his hemoglobin A1c has fallen from 5.9-5.7 and his fasting blood sugar is down to 95. His most recent lab work as listed below: Appointment on 12/10/2014  Component Date Value Ref Range Status  . Cholesterol 12/10/2014 129  0 - 200 mg/dL Final   Comment: ATP III Classification:       < 200        mg/dL        Desirable      200 - 239     mg/dL        Borderline High      >= 240        mg/dL        High     . Triglycerides 12/10/2014 99  <150 mg/dL Final  . HDL 12/10/2014 48  >=40 mg/dL Final   ** Please note change in reference range(s). **  . Total CHOL/HDL Ratio 12/10/2014 2.7   Final  . VLDL 12/10/2014 20  0 - 40 mg/dL Final  . LDL Cholesterol 12/10/2014 61  0 - 99 mg/dL Final   Comment:   Total Cholesterol/HDL Ratio:CHD Risk                        Coronary Heart Disease Risk Table                                        Men       Women          1/2 Average Risk              3.4        3.3              Average Risk              5.0        4.4           2X Average Risk              9.6        7.1           3X Average Risk             23.4       11.0 Use the calculated Patient Ratio above and the CHD Risk table  to determine the patient's CHD Risk. ATP III Classification (LDL):       < 100        mg/dL         Optimal      100 - 129     mg/dL         Near or Above Optimal      130 - 159     mg/dL  Borderline High      160 - 189      mg/dL         High       > 190        mg/dL         Very High     . Hgb A1c MFr Bld 12/10/2014 5.7* <5.7 % Final   Comment:                                                                        According to the ADA Clinical Practice Recommendations for 2011, when HbA1c is used as a screening test:     >=6.5%   Diagnostic of Diabetes Mellitus            (if abnormal result is confirmed)   5.7-6.4%   Increased risk of developing Diabetes Mellitus   References:Diagnosis and Classification of Diabetes Mellitus,Diabetes URKY,7062,37(SEGBT 1):S62-S69 and Standards of Medical Care in         Diabetes - 2011,Diabetes DVVO,1607,37 (Suppl 1):S11-S61.     . Mean Plasma Glucose 12/10/2014 117* <117 mg/dL Final  . Sodium 12/10/2014 140  135 - 145 mEq/L Final  . Potassium 12/10/2014 4.8  3.5 - 5.3 mEq/L Final  . Chloride 12/10/2014 106  96 - 112 mEq/L Final  . CO2 12/10/2014 28  19 - 32 mEq/L Final  . Glucose, Bld 12/10/2014 95  70 - 99 mg/dL Final  . BUN 12/10/2014 22  6 - 23 mg/dL Final  . Creat 12/10/2014 1.11  0.50 - 1.35 mg/dL Final  . Total Bilirubin 12/10/2014 0.6  0.2 - 1.2 mg/dL Final  . Alkaline Phosphatase 12/10/2014 65  39 - 117 U/L Final  . AST 12/10/2014 19  0 - 37 U/L Final  . ALT 12/10/2014 18  0 - 53 U/L Final  . Total Protein 12/10/2014 5.9* 6.0 - 8.3 g/dL Final  . Albumin 12/10/2014 3.7  3.5 - 5.2 g/dL Final  . Calcium 12/10/2014 9.2  8.4 - 10.5 mg/dL Final  . GFR, Est African American 12/10/2014 73   Final  . GFR, Est Non African American 12/10/2014 63   Final   Comment:   The estimated GFR is a calculation valid for adults (>=72 years old) that uses the CKD-EPI algorithm to adjust for age and sex. It is   not to be used for children, pregnant women, hospitalized patients,    patients on dialysis, or with rapidly changing kidney function. According to the NKDEP, eGFR >89 is normal, 60-89 shows mild impairment, 30-59 shows moderate impairment, 15-29 shows  severe impairment and <15 is ESRD.      Past Medical History  Diagnosis Date  . Rectal pain   . Blood in stool   . Hemorrhoids   . Rectal fissure   . Hypertension   . Hyperlipidemia    Past Surgical History  Procedure Laterality Date  . Hernia repair  2000 (approx)  . Shoulder surgery  1997    both  . Lower back surgery  05/1995, 05/1998   Current Outpatient Prescriptions on File Prior to Visit  Medication Sig Dispense Refill  . aspirin 81 MG tablet Take 81 mg by mouth daily.      Marland Kitchen  lisinopril-hydrochlorothiazide (PRINZIDE,ZESTORETIC) 10-12.5 MG per tablet TAKE ONE TABLET BY MOUTH TWICE DAILY 180 tablet 1  . metoprolol tartrate (LOPRESSOR) 25 MG tablet TAKE ONE TABLET BY MOUTH TWICE DAILY 180 tablet 3  . Multiple Vitamins-Minerals (CENTRUM SILVER ADULT 50+ PO) Take 1 tablet by mouth daily.    . simvastatin (ZOCOR) 20 MG tablet Take 1 tablet (20 mg total) by mouth at bedtime. 60 tablet 3  . fluticasone (FLONASE) 50 MCG/ACT nasal spray Place 2 sprays into both nostrils daily. (Patient not taking: Reported on 12/14/2014) 16 g 1   No current facility-administered medications on file prior to visit.   No Known Allergies History   Social History  . Marital Status: Widowed    Spouse Name: N/A  . Number of Children: N/A  . Years of Education: N/A   Occupational History  . Not on file.   Social History Main Topics  . Smoking status: Never Smoker   . Smokeless tobacco: Never Used  . Alcohol Use: No  . Drug Use: No  . Sexual Activity: Not on file   Other Topics Concern  . Not on file   Social History Narrative      Review of Systems  All other systems reviewed and are negative.      Objective:   Physical Exam  Constitutional: He appears well-developed and well-nourished.  Neck: Neck supple.  Cardiovascular: Normal rate, regular rhythm, normal heart sounds and intact distal pulses.   No murmur heard. Pulmonary/Chest: Effort normal and breath sounds normal. No  respiratory distress. He has no wheezes. He has no rales.  Abdominal: Soft. Bowel sounds are normal. He exhibits no distension. There is no tenderness. There is no rebound and no guarding.  Musculoskeletal: He exhibits no edema.  Lymphadenopathy:    He has no cervical adenopathy.  Vitals reviewed.         Assessment & Plan:  Neoplasm of uncertain behavior of skin  Benign essential HTN  HLD (hyperlipidemia)  Patient's blood pressure is excellent. His cholesterol is outstanding. I will make no changes in his medication at this time. However on his right upper lip, the patient has had a lesion for several months. It is 2 mm in size. It is an erythematous warty papule that appears to be a possible early skin cancer. The patient is requesting cryotherapy for this today. This area was then treated with liquid nitrogen cryotherapy for a total of 30 seconds. The patient tolerated procedure well with no complication. If the lesion persists, I recommended a shave biopsy.

## 2015-01-20 ENCOUNTER — Other Ambulatory Visit: Payer: Self-pay | Admitting: Family Medicine

## 2015-01-28 DIAGNOSIS — L570 Actinic keratosis: Secondary | ICD-10-CM | POA: Diagnosis not present

## 2015-01-28 DIAGNOSIS — D229 Melanocytic nevi, unspecified: Secondary | ICD-10-CM | POA: Diagnosis not present

## 2015-01-28 DIAGNOSIS — L821 Other seborrheic keratosis: Secondary | ICD-10-CM | POA: Diagnosis not present

## 2015-02-09 ENCOUNTER — Other Ambulatory Visit: Payer: Self-pay | Admitting: Family Medicine

## 2015-02-09 NOTE — Telephone Encounter (Signed)
Medication refilled per protocol. 

## 2015-03-17 ENCOUNTER — Other Ambulatory Visit: Payer: Self-pay | Admitting: Family Medicine

## 2015-03-18 ENCOUNTER — Other Ambulatory Visit: Payer: Self-pay | Admitting: Family Medicine

## 2015-03-18 MED ORDER — LISINOPRIL-HYDROCHLOROTHIAZIDE 10-12.5 MG PO TABS: 1.0000 | ORAL_TABLET | Freq: Two times a day (BID) | ORAL | Status: DC

## 2015-03-18 NOTE — Telephone Encounter (Signed)
Medication refilled per protocol. 

## 2015-03-21 ENCOUNTER — Encounter: Payer: Self-pay | Admitting: Family Medicine

## 2015-03-21 ENCOUNTER — Ambulatory Visit (INDEPENDENT_AMBULATORY_CARE_PROVIDER_SITE_OTHER): Payer: Medicare Other | Admitting: Family Medicine

## 2015-03-21 VITALS — BP 110/68 | HR 68 | Temp 97.5°F | Resp 16 | Ht 74.0 in | Wt 200.0 lb

## 2015-03-21 DIAGNOSIS — H1013 Acute atopic conjunctivitis, bilateral: Secondary | ICD-10-CM

## 2015-03-21 MED ORDER — PREDNISOLONE ACETATE 0.12 % OP SUSP: 1.0000 [drp] | Freq: Four times a day (QID) | OPHTHALMIC | Status: DC

## 2015-03-21 NOTE — Progress Notes (Signed)
Subjective:    Patient ID: Jesse Doyne., male    DOB: 18-Apr-1935, 80 y.o.   MRN: [id number]  HPI Patient complains of dry itchy eyes for 3 weeks. He states it feels like there is dirt in both of his eyes. He has been using over-the-counter allergy eyedrops as well as saline eyedrops with minimal relief. He reports itching. He denies any blurred vision. He denies any vision changes. He denies any pain or photophobia. Past Medical History  Diagnosis Date  . Rectal pain   . Blood in stool   . Hemorrhoids   . Rectal fissure   . Hypertension   . Hyperlipidemia    Past Surgical History  Procedure Laterality Date  . Hernia repair  2000 (approx)  . Shoulder surgery  1997    both  . Lower back surgery  05/1995, 05/1998   Current Outpatient Prescriptions on File Prior to Visit  Medication Sig Dispense Refill  . aspirin 81 MG tablet Take 81 mg by mouth daily.      . fluticasone (FLONASE) 50 MCG/ACT nasal spray Place 2 sprays into both nostrils daily. (Patient not taking: Reported on 12/14/2014) 16 g 1  . lisinopril-hydrochlorothiazide (PRINZIDE,ZESTORETIC) 10-12.5 MG per tablet Take 1 tablet by mouth 2 (two) times daily. 180 tablet 2  . metoprolol tartrate (LOPRESSOR) 25 MG tablet TAKE ONE TABLET BY MOUTH TWICE DAILY 180 tablet 2  . Multiple Vitamins-Minerals (CENTRUM SILVER ADULT 50+ PO) Take 1 tablet by mouth daily.    . simvastatin (ZOCOR) 20 MG tablet TAKE ONE TABLET BY MOUTH AT BEDTIME 90 tablet 3   No current facility-administered medications on file prior to visit.   No Known Allergies Social History   Social History  . Marital Status: Widowed    Spouse Name: N/A  . Number of Children: N/A  . Years of Education: N/A   Occupational History  . Not on file.   Social History Main Topics  . Smoking status: Never Smoker   . Smokeless tobacco: Never Used  . Alcohol Use: No  . Drug Use: No  . Sexual Activity: Not on file   Other Topics Concern  . Not on file   Social  History Narrative     Review of Systems  All other systems reviewed and are negative.      Objective:   Physical Exam  Constitutional: He appears well-developed and well-nourished. No distress.  Cardiovascular: Normal rate, regular rhythm and normal heart sounds.   No murmur heard. Pulmonary/Chest: Effort normal and breath sounds normal. No respiratory distress. He has no wheezes. He has no rales.  Skin: He is not diaphoretic.  Vitals reviewed.  extraocular movements are intact, pupils are equal round reactive to light. There is mild conjunctival injection in both eyes. On fluorescein exam, there are no corneal ulcers, corneal abrasions, or dendritic lesions bilaterally.        Assessment & Plan:  Allergic conjunctivitis, bilateral - Plan: prednisoLONE acetate (PRED MILD) 0.12 % ophthalmic suspension  Symptoms are consistent with allergic conjunctivitis compounded by dry. I recommended prednisolone acetate ophthalmic eyedrops, 1 drop 4 times a day in both eyes for the next 3 or 4 days. This should calm the eye irritation down.  Then I will switch to something such as Patanol or Pataday if symptoms persist afterwards. He can also use saline eyedrops as needed. I see no evidence of infection. Patient has no pain or photophobia. Therefore I do not see any evidence of scleritis  or uveitis

## 2015-03-25 ENCOUNTER — Ambulatory Visit (INDEPENDENT_AMBULATORY_CARE_PROVIDER_SITE_OTHER): Payer: Medicare Other | Admitting: *Deleted

## 2015-03-25 DIAGNOSIS — Z23 Encounter for immunization: Secondary | ICD-10-CM | POA: Diagnosis not present

## 2015-03-25 NOTE — Progress Notes (Signed)
Patient ID: Jesse Fletcher., male   DOB: 17-Jan-1935, 80 y.o.   MRN: [id number]  Patient seen in office for Influenza Vaccination.   Tolerated IM administration well.   Immunization history updated.

## 2015-04-05 DIAGNOSIS — L570 Actinic keratosis: Secondary | ICD-10-CM | POA: Diagnosis not present

## 2015-04-12 DIAGNOSIS — S83242A Other tear of medial meniscus, current injury, left knee, initial encounter: Secondary | ICD-10-CM | POA: Diagnosis not present

## 2015-06-08 DIAGNOSIS — S83242A Other tear of medial meniscus, current injury, left knee, initial encounter: Secondary | ICD-10-CM | POA: Diagnosis not present

## 2015-08-01 DIAGNOSIS — N138 Other obstructive and reflux uropathy: Secondary | ICD-10-CM | POA: Diagnosis not present

## 2015-08-01 DIAGNOSIS — R351 Nocturia: Secondary | ICD-10-CM | POA: Diagnosis not present

## 2015-08-01 DIAGNOSIS — N401 Enlarged prostate with lower urinary tract symptoms: Secondary | ICD-10-CM | POA: Diagnosis not present

## 2015-08-01 DIAGNOSIS — N411 Chronic prostatitis: Secondary | ICD-10-CM | POA: Diagnosis not present

## 2015-08-01 DIAGNOSIS — Z0000 Encounter for general adult medical examination without abnormal findings: Secondary | ICD-10-CM | POA: Diagnosis not present

## 2015-08-09 DIAGNOSIS — L570 Actinic keratosis: Secondary | ICD-10-CM | POA: Diagnosis not present

## 2015-09-06 ENCOUNTER — Encounter: Payer: Self-pay | Admitting: *Deleted

## 2015-09-06 ENCOUNTER — Encounter: Payer: Self-pay | Admitting: Family Medicine

## 2015-09-06 ENCOUNTER — Ambulatory Visit (INDEPENDENT_AMBULATORY_CARE_PROVIDER_SITE_OTHER): Payer: Medicare Other | Admitting: Family Medicine

## 2015-09-06 VITALS — BP 100/60 | HR 68 | Temp 98.5°F | Resp 16 | Ht 74.0 in | Wt 209.0 lb

## 2015-09-06 DIAGNOSIS — J019 Acute sinusitis, unspecified: Secondary | ICD-10-CM

## 2015-09-06 MED ORDER — AMOXICILLIN 875 MG PO TABS: 875.0000 mg | ORAL_TABLET | Freq: Two times a day (BID) | ORAL | Status: DC

## 2015-09-06 NOTE — Progress Notes (Signed)
   Subjective:    Patient ID: Jesse Doyne., male    DOB: [birth year], 81 y.o.   MRN: [id number]  HPI  Patient reports 3 days of sinus congestion, rhinorrhea, postnasal drip, and a sore throat. He denies any fever. He denies any sinus pain. He is requesting amoxicillin Past Medical History  Diagnosis Date  . Rectal pain   . Blood in stool   . Hemorrhoids   . Rectal fissure   . Hypertension   . Hyperlipidemia    Past Surgical History  Procedure Laterality Date  . Hernia repair  2000 (approx)  . Shoulder surgery  1997    both  . Lower back surgery  05/1995, 05/1998   Current Outpatient Prescriptions on File Prior to Visit  Medication Sig Dispense Refill  . aspirin 81 MG tablet Take 81 mg by mouth daily.      . fluticasone (FLONASE) 50 MCG/ACT nasal spray Place 2 sprays into both nostrils daily. 16 g 1  . lisinopril-hydrochlorothiazide (PRINZIDE,ZESTORETIC) 10-12.5 MG per tablet Take 1 tablet by mouth 2 (two) times daily. 180 tablet 2  . metoprolol tartrate (LOPRESSOR) 25 MG tablet TAKE ONE TABLET BY MOUTH TWICE DAILY 180 tablet 2  . Multiple Vitamins-Minerals (CENTRUM SILVER ADULT 50+ PO) Take 1 tablet by mouth daily.    . simvastatin (ZOCOR) 20 MG tablet TAKE ONE TABLET BY MOUTH AT BEDTIME 90 tablet 3   No current facility-administered medications on file prior to visit.   No Known Allergies Social History   Social History  . Marital Status: Widowed    Spouse Name: N/A  . Number of Children: N/A  . Years of Education: N/A   Occupational History  . Not on file.   Social History Main Topics  . Smoking status: Never Smoker   . Smokeless tobacco: Never Used  . Alcohol Use: No  . Drug Use: No  . Sexual Activity: Not on file   Other Topics Concern  . Not on file   Social History Narrative     Review of Systems  All other systems reviewed and are negative.      Objective:   Physical Exam  HENT:  Right Ear: Tympanic membrane, external ear and ear canal  normal.  Left Ear: Tympanic membrane, external ear and ear canal normal.  Nose: Mucosal edema and rhinorrhea present.  Mouth/Throat: Oropharynx is clear and moist. No oropharyngeal exudate.  Eyes: Conjunctivae are normal.  Neck: Neck supple.  Cardiovascular: Normal rate, regular rhythm and normal heart sounds.   Pulmonary/Chest: Effort normal and breath sounds normal. No respiratory distress. He has no wheezes. He has no rales.  Lymphadenopathy:    He has no cervical adenopathy.  Vitals reviewed.         Assessment & Plan:  Acute rhinosinusitis - Plan: amoxicillin (AMOXIL) 875 MG tablet  Symptoms are consistent with a viral upper respiratory infection. I recommended Coricidin and Mucinex and tincture of time. I explained to the patient that repeated uses of antibiotics can cause him to develop an antibiotic resistant strain that could be dangerous. I gave the patient amoxicillin he requested but asked him not to fill it unless symptoms last longer than 7 days or he develops a fever.

## 2015-09-15 ENCOUNTER — Encounter: Payer: Self-pay | Admitting: Family Medicine

## 2015-09-15 ENCOUNTER — Ambulatory Visit (INDEPENDENT_AMBULATORY_CARE_PROVIDER_SITE_OTHER): Payer: Medicare Other | Admitting: Family Medicine

## 2015-09-15 VITALS — BP 110/68 | HR 74 | Temp 97.9°F | Resp 16 | Ht 74.0 in | Wt 211.0 lb

## 2015-09-15 DIAGNOSIS — J329 Chronic sinusitis, unspecified: Secondary | ICD-10-CM

## 2015-09-15 DIAGNOSIS — J310 Chronic rhinitis: Secondary | ICD-10-CM

## 2015-09-15 MED ORDER — PREDNISONE 20 MG PO TABS: ORAL_TABLET | ORAL | Status: DC

## 2015-09-15 MED ORDER — CEFDINIR 300 MG PO CAPS: 300.0000 mg | ORAL_CAPSULE | Freq: Two times a day (BID) | ORAL | Status: DC

## 2015-09-15 NOTE — Progress Notes (Signed)
Subjective:    Patient ID: Jesse Fletcher., male    DOB: 1934-08-04, 81 y.o.   MRN: [id number]  HPI  09/06/14 Patient reports 3 days of sinus congestion, rhinorrhea, postnasal drip, and a sore throat. He denies any fever. He denies any sinus pain. He is requesting amoxicillin.  At that time, my plan was: Symptoms are consistent with a viral upper respiratory infection. I recommended Coricidin and Mucinex and tincture of time. I explained to the patient that repeated uses of antibiotics can cause him to develop an antibiotic resistant strain that could be dangerous. I gave the patient amoxicillin he requested but asked him not to fill it unless symptoms last longer than 7 days or he develops a fever.  09/15/15 Patient is no better. Continues to complain of a sinus headache. Continues to complain of rhinorrhea and pressure in his maxillary sinuses. He continues to complain of postnasal drip. He states it is not getting better and has been more than 2 weeks. Past Medical History  Diagnosis Date  . Rectal pain   . Blood in stool   . Hemorrhoids   . Rectal fissure   . Hypertension   . Hyperlipidemia    Past Surgical History  Procedure Laterality Date  . Hernia repair  2000 (approx)  . Shoulder surgery  1997    both  . Lower back surgery  05/1995, 05/1998   Current Outpatient Prescriptions on File Prior to Visit  Medication Sig Dispense Refill  . amoxicillin (AMOXIL) 875 MG tablet Take 1 tablet (875 mg total) by mouth 2 (two) times daily. 20 tablet 0  . aspirin 81 MG tablet Take 81 mg by mouth daily.      . fluticasone (FLONASE) 50 MCG/ACT nasal spray Place 2 sprays into both nostrils daily. 16 g 1  . lisinopril-hydrochlorothiazide (PRINZIDE,ZESTORETIC) 10-12.5 MG per tablet Take 1 tablet by mouth 2 (two) times daily. 180 tablet 2  . metoprolol tartrate (LOPRESSOR) 25 MG tablet TAKE ONE TABLET BY MOUTH TWICE DAILY 180 tablet 2  . Multiple Vitamins-Minerals (CENTRUM SILVER ADULT 50+ PO)  Take 1 tablet by mouth daily.    . simvastatin (ZOCOR) 20 MG tablet TAKE ONE TABLET BY MOUTH AT BEDTIME 90 tablet 3   No current facility-administered medications on file prior to visit.   No Known Allergies Social History   Social History  . Marital Status: Widowed    Spouse Name: N/A  . Number of Children: N/A  . Years of Education: N/A   Occupational History  . Not on file.   Social History Main Topics  . Smoking status: Never Smoker   . Smokeless tobacco: Never Used  . Alcohol Use: No  . Drug Use: No  . Sexual Activity: Not on file   Other Topics Concern  . Not on file   Social History Narrative     Review of Systems  All other systems reviewed and are negative.      Objective:   Physical Exam  HENT:  Right Ear: Tympanic membrane, external ear and ear canal normal.  Left Ear: Tympanic membrane, external ear and ear canal normal.  Nose: Mucosal edema and rhinorrhea present.  Mouth/Throat: Oropharynx is clear and moist. No oropharyngeal exudate.  Eyes: Conjunctivae are normal.  Neck: Neck supple.  Cardiovascular: Normal rate, regular rhythm and normal heart sounds.   Pulmonary/Chest: Effort normal and breath sounds normal. No respiratory distress. He has no wheezes. He has no rales.  Lymphadenopathy:  He has no cervical adenopathy.  Vitals reviewed.         Assessment & Plan:  Rhinosinusitis - Plan: predniSONE (DELTASONE) 20 MG tablet, cefdinir (OMNICEF) 300 MG capsule  Begin Omnicef 300 mg by mouth twice a day for 10 days. Begin prednisone taper pack

## 2015-10-07 ENCOUNTER — Other Ambulatory Visit: Payer: Medicare Other

## 2015-10-07 DIAGNOSIS — E119 Type 2 diabetes mellitus without complications: Secondary | ICD-10-CM | POA: Diagnosis not present

## 2015-10-07 DIAGNOSIS — I10 Essential (primary) hypertension: Secondary | ICD-10-CM | POA: Diagnosis not present

## 2015-10-07 LAB — HEMOGLOBIN A1C
Hgb A1c MFr Bld: 5.8 % — ABNORMAL HIGH (ref <5.7)
Mean Plasma Glucose: 120 mg/dL

## 2015-10-07 LAB — COMPLETE METABOLIC PANEL WITH GFR
ALBUMIN: 4 g/dL (ref 3.6–5.1)
ALK PHOS: 57 U/L (ref 40–115)
ALT: 17 U/L (ref 9–46)
AST: 19 U/L (ref 10–35)
BUN: 24 mg/dL (ref 7–25)
CALCIUM: 9 mg/dL (ref 8.6–10.3)
CO2: 28 mmol/L (ref 20–31)
CREATININE: 0.98 mg/dL (ref 0.70–1.18)
Chloride: 103 mmol/L (ref 98–110)
GFR, Est African American: 84 mL/min (ref >=60)
GFR, Est Non African American: 73 mL/min (ref >=60)
Glucose, Bld: 98 mg/dL (ref 70–99)
Potassium: 4.3 mmol/L (ref 3.5–5.3)
Sodium: 142 mmol/L (ref 135–146)
TOTAL PROTEIN: 6.2 g/dL (ref 6.1–8.1)
Total Bilirubin: 0.7 mg/dL (ref 0.2–1.2)

## 2015-10-07 LAB — LIPID PANEL
CHOLESTEROL: 132 mg/dL (ref 125–200)
HDL: 50 mg/dL (ref >=40)
LDL Cholesterol: 66 mg/dL (ref <130)
Total CHOL/HDL Ratio: 2.6 Ratio (ref <=5.0)
Triglycerides: 81 mg/dL (ref <150)
VLDL: 16 mg/dL (ref <30)

## 2015-10-10 ENCOUNTER — Encounter: Payer: Self-pay | Admitting: Family Medicine

## 2015-10-17 ENCOUNTER — Ambulatory Visit (INDEPENDENT_AMBULATORY_CARE_PROVIDER_SITE_OTHER): Payer: Medicare Other | Admitting: Family Medicine

## 2015-10-17 ENCOUNTER — Encounter: Payer: Self-pay | Admitting: Family Medicine

## 2015-10-17 VITALS — BP 100/64 | HR 80 | Temp 97.5°F | Resp 16 | Ht 74.0 in | Wt 208.0 lb

## 2015-10-17 DIAGNOSIS — I10 Essential (primary) hypertension: Secondary | ICD-10-CM

## 2015-10-17 DIAGNOSIS — E785 Hyperlipidemia, unspecified: Secondary | ICD-10-CM

## 2015-10-17 NOTE — Progress Notes (Signed)
Subjective:    Patient ID: Jesse Doyne., male    DOB: 08-11-1934, 81 y.o.   MRN: [id number]  HPI Here today for follow-up of his hypertension.  Blood pressure is slightly low at 100/64. He is taking to lisinopril tablets every day along with Lopressor 25 mg one by mouth twice a day. He denies any chest pain shortness of breath or dyspnea on exertion. He denies any myalgias or right upper quadrant pain on Zocor 20 mg by mouth daily Past Medical History  Diagnosis Date  . Rectal pain   . Blood in stool   . Hemorrhoids   . Rectal fissure   . Hypertension   . Hyperlipidemia    Past Surgical History  Procedure Laterality Date  . Hernia repair  2000 (approx)  . Shoulder surgery  1997    both  . Lower back surgery  05/1995, 05/1998   Current Outpatient Prescriptions on File Prior to Visit  Medication Sig Dispense Refill  . aspirin 81 MG tablet Take 81 mg by mouth daily.      . fluticasone (FLONASE) 50 MCG/ACT nasal spray Place 2 sprays into both nostrils daily. 16 g 1  . lisinopril-hydrochlorothiazide (PRINZIDE,ZESTORETIC) 10-12.5 MG per tablet Take 1 tablet by mouth 2 (two) times daily. 180 tablet 2  . metoprolol tartrate (LOPRESSOR) 25 MG tablet TAKE ONE TABLET BY MOUTH TWICE DAILY 180 tablet 2  . Multiple Vitamins-Minerals (CENTRUM SILVER ADULT 50+ PO) Take 1 tablet by mouth daily.    . simvastatin (ZOCOR) 20 MG tablet TAKE ONE TABLET BY MOUTH AT BEDTIME 90 tablet 3   No current facility-administered medications on file prior to visit.   No Known Allergies Social History   Social History  . Marital Status: Widowed    Spouse Name: N/A  . Number of Children: N/A  . Years of Education: N/A   Occupational History  . Not on file.   Social History Main Topics  . Smoking status: Never Smoker   . Smokeless tobacco: Never Used  . Alcohol Use: No  . Drug Use: No  . Sexual Activity: Not on file   Other Topics Concern  . Not on file   Social History Narrative       Review of Systems  All other systems reviewed and are negative.      Objective:   Physical Exam  Cardiovascular: Normal rate, regular rhythm and normal heart sounds.   Pulmonary/Chest: Effort normal and breath sounds normal. No respiratory distress. He has no wheezes. He has no rales.  Abdominal: Soft. Bowel sounds are normal. He exhibits no distension. There is no tenderness. There is no rebound and no guarding.  Vitals reviewed.         Assessment & Plan:  Essential hypertension  Hyperlipidemia  Blood pressures well controlled. Decrease lisinopril HCT  to 10/12.5 daily. Continue metoprolol at his current dose. Continue simvastatin at 20 mg a day. His most recent lab work is listed below and is excellent Appointment on 10/07/2015  Component Date Value Ref Range Status  . Cholesterol 10/07/2015 132  125 - 200 mg/dL Final  . Triglycerides 10/07/2015 81  <150 mg/dL Final  . HDL 10/07/2015 50  >=40 mg/dL Final  . Total CHOL/HDL Ratio 10/07/2015 2.6  <=5.0 Ratio Final  . VLDL 10/07/2015 16  <30 mg/dL Final  . LDL Cholesterol 10/07/2015 66  <130 mg/dL Final   Comment:   Total Cholesterol/HDL Ratio:CHD Risk  Coronary Heart Disease Risk Table                                        Men       Women          1/2 Average Risk              3.4        3.3              Average Risk              5.0        4.4           2X Average Risk              9.6        7.1           3X Average Risk             23.4       11.0 Use the calculated Patient Ratio above and the CHD Risk table  to determine the patient's CHD Risk.   . Hgb A1c MFr Bld 10/07/2015 5.8* <5.7 % Final   Comment:   For someone without known diabetes, a hemoglobin A1c value between 5.7% and 6.4% is consistent with prediabetes and should be confirmed with a follow-up test.   For someone with known diabetes, a value <7% indicates that their diabetes is well controlled. A1c targets should be  individualized based on duration of diabetes, age, co-morbid conditions and other considerations.   This assay result is consistent with an increased risk of diabetes.   Currently, no consensus exists regarding use of hemoglobin A1c for diagnosis of diabetes in children.     . Mean Plasma Glucose 10/07/2015 120   Final  . Sodium 10/07/2015 142  135 - 146 mmol/L Final  . Potassium 10/07/2015 4.3  3.5 - 5.3 mmol/L Final  . Chloride 10/07/2015 103  98 - 110 mmol/L Final  . CO2 10/07/2015 28  20 - 31 mmol/L Final  . Glucose, Bld 10/07/2015 98  70 - 99 mg/dL Final  . BUN 10/07/2015 24  7 - 25 mg/dL Final  . Creat 10/07/2015 0.98  0.70 - 1.18 mg/dL Final  . Total Bilirubin 10/07/2015 0.7  0.2 - 1.2 mg/dL Final  . Alkaline Phosphatase 10/07/2015 57  40 - 115 U/L Final  . AST 10/07/2015 19  10 - 35 U/L Final  . ALT 10/07/2015 17  9 - 46 U/L Final  . Total Protein 10/07/2015 6.2  6.1 - 8.1 g/dL Final  . Albumin 10/07/2015 4.0  3.6 - 5.1 g/dL Final  . Calcium 10/07/2015 9.0  8.6 - 10.3 mg/dL Final  . GFR, Est African American 10/07/2015 84  >=60 mL/min Final  . GFR, Est Non African American 10/07/2015 73  >=60 mL/min Final   Comment:   The estimated GFR is a calculation valid for adults (>=38 years old) that uses the CKD-EPI algorithm to adjust for age and sex. It is   not to be used for children, pregnant women, hospitalized patients,    patients on dialysis, or with rapidly changing kidney function. According to the NKDEP, eGFR >89 is normal, 60-89 shows mild impairment, 30-59 shows moderate impairment, 15-29 shows severe impairment and <15 is ESRD.

## 2015-10-20 ENCOUNTER — Other Ambulatory Visit: Payer: Self-pay | Admitting: Family Medicine

## 2015-12-05 DIAGNOSIS — L570 Actinic keratosis: Secondary | ICD-10-CM | POA: Diagnosis not present

## 2015-12-05 DIAGNOSIS — L84 Corns and callosities: Secondary | ICD-10-CM | POA: Diagnosis not present

## 2016-01-28 ENCOUNTER — Other Ambulatory Visit: Payer: Self-pay | Admitting: Family Medicine

## 2016-02-07 DIAGNOSIS — L570 Actinic keratosis: Secondary | ICD-10-CM | POA: Diagnosis not present

## 2016-03-30 ENCOUNTER — Encounter: Payer: Self-pay | Admitting: Family Medicine

## 2016-03-30 ENCOUNTER — Ambulatory Visit (INDEPENDENT_AMBULATORY_CARE_PROVIDER_SITE_OTHER): Payer: Medicare Other | Admitting: Family Medicine

## 2016-03-30 VITALS — BP 132/72 | HR 80 | Temp 97.9°F | Resp 14 | Ht 74.0 in | Wt 201.0 lb

## 2016-03-30 DIAGNOSIS — M791 Myalgia, unspecified site: Secondary | ICD-10-CM

## 2016-03-30 LAB — CBC
HEMATOCRIT: 46.8 % (ref 38.5–50.0)
HEMOGLOBIN: 14.9 g/dL (ref 13.0–17.0)
MCH: 29.6 pg (ref 27.0–33.0)
MCHC: 31.8 g/dL — AB (ref 32.0–36.0)
MCV: 92.9 fL (ref 80.0–100.0)
Platelets: 270 10*3/uL (ref 140–400)
RBC: 5.04 MIL/uL (ref 4.20–5.80)
RDW: 13.9 % (ref 11.0–15.0)
WBC: 7.5 10*3/uL (ref 3.8–10.8)

## 2016-03-30 NOTE — Progress Notes (Signed)
   Subjective:    Patient ID: Jesse Doyne., male    DOB: September 18, 1934, 81 y.o.   MRN: [id number]  Patient presents for Illness (x1 week- generalized mild HA, muscle aches- denies fever, nasal drainage) Pt  here with vague symptoms of mild headache muscle aches for the past week. He has not had any cough congestion and sinus drainage no fever. States they he has not felt sick he did have a scratchy throat a couple weeks ago but that improved. He takes Tylenol on a regular basis for arthritis. No known sick contacts. He denies any change in his vision denies any swelling of the joints. He still able to do all his activities his appetite is normal. No recent change in his medications.    Review Of Systems:  GEN- denies fatigue, fever, weight loss,weakness, recent illness HEENT- denies eye drainage, change in vision, nasal discharge, CVS- denies chest pain, palpitations RESP- denies SOB, cough, wheeze ABD- denies N/V, change in stools, abd pain GU- denies dysuria, hematuria, dribbling, incontinence MSK- denies joint pain, +muscle aches, injury Neuro- denies headache, dizziness, syncope, seizure activity       Objective:    BP 132/72 (BP Location: Left Arm, Patient Position: Sitting, Cuff Size: Normal)   Pulse 80   Temp 97.9 F (36.6 C) (Oral)   Resp 14   Ht 6\' 2"  (1.88 m)   Wt 201 lb (91.2 kg)   BMI 25.81 kg/m  GEN- NAD, alert and oriented x3, non  Toxic appearing  HEENT- PERRL, EOMI, non injected sclera, pink conjunctiva, MMM, oropharynx clear, nares clear Neck- Supple, fair ROM, no LAD  CVS- RRR, no murmur RESP-CTAB ABD-NABS,soft,NT,ND MSK- no swelling, Good ROM upper and Lower ext  Neuro-CNII-XII in tact, no focal deficits EXT- No edema Pulses- Radial  2+        Assessment & Plan:      Problem List Items Addressed This Visit    None    Visit Diagnoses    Myalgia    -  Primary   Very mild vague symptoms no sign of infection, DD mild viral infection vs  medication reaction with his statin- he recalled later that the statin has called muscle aches and pains in the past. CBC was done in office, WBC are normal. He looks well in general.  Will give trial off his statin, he has f/u for PCP in a few weeks, will see if this makes any difference.    Relevant Orders   CBC (Completed)      Note: This dictation was prepared with Dragon dictation along with smaller phrase technology. Any transcriptional errors that result from this process are unintentional.

## 2016-03-30 NOTE — Patient Instructions (Signed)
F/U end of Oct Dr. Dennard Schaumann

## 2016-04-13 ENCOUNTER — Ambulatory Visit (INDEPENDENT_AMBULATORY_CARE_PROVIDER_SITE_OTHER): Payer: Medicare Other

## 2016-04-13 ENCOUNTER — Other Ambulatory Visit: Payer: Medicare Other

## 2016-04-13 DIAGNOSIS — E119 Type 2 diabetes mellitus without complications: Secondary | ICD-10-CM

## 2016-04-13 DIAGNOSIS — I10 Essential (primary) hypertension: Secondary | ICD-10-CM

## 2016-04-13 DIAGNOSIS — Z23 Encounter for immunization: Secondary | ICD-10-CM | POA: Diagnosis not present

## 2016-04-13 LAB — LIPID PANEL
CHOLESTEROL: 162 mg/dL (ref 125–200)
HDL: 50 mg/dL (ref >=40)
LDL CALC: 91 mg/dL (ref <130)
Total CHOL/HDL Ratio: 3.2 Ratio (ref <=5.0)
Triglycerides: 104 mg/dL (ref <150)
VLDL: 21 mg/dL (ref <30)

## 2016-04-13 LAB — HEMOGLOBIN A1C
Hgb A1c MFr Bld: 5.4 % (ref <5.7)
MEAN PLASMA GLUCOSE: 108 mg/dL

## 2016-04-13 LAB — COMPLETE METABOLIC PANEL WITH GFR
ALT: 14 U/L (ref 9–46)
AST: 20 U/L (ref 10–35)
Albumin: 3.9 g/dL (ref 3.6–5.1)
Alkaline Phosphatase: 59 U/L (ref 40–115)
BUN: 21 mg/dL (ref 7–25)
CO2: 26 mmol/L (ref 20–31)
Calcium: 9.1 mg/dL (ref 8.6–10.3)
Chloride: 102 mmol/L (ref 98–110)
Creat: 1.04 mg/dL (ref 0.70–1.18)
GFR, EST NON AFRICAN AMERICAN: 68 mL/min (ref >=60)
GFR, Est African American: 79 mL/min (ref >=60)
GLUCOSE: 111 mg/dL — AB (ref 70–99)
POTASSIUM: 4.5 mmol/L (ref 3.5–5.3)
SODIUM: 139 mmol/L (ref 135–146)
Total Bilirubin: 0.8 mg/dL (ref 0.2–1.2)
Total Protein: 6.4 g/dL (ref 6.1–8.1)

## 2016-04-19 ENCOUNTER — Encounter: Payer: Self-pay | Admitting: Family Medicine

## 2016-05-01 ENCOUNTER — Ambulatory Visit (INDEPENDENT_AMBULATORY_CARE_PROVIDER_SITE_OTHER): Payer: Medicare Other | Admitting: Family Medicine

## 2016-05-01 ENCOUNTER — Encounter: Payer: Self-pay | Admitting: Family Medicine

## 2016-05-01 VITALS — BP 110/78 | HR 57 | Temp 97.8°F | Resp 16 | Ht 74.0 in | Wt 197.0 lb

## 2016-05-01 DIAGNOSIS — E7800 Pure hypercholesterolemia, unspecified: Secondary | ICD-10-CM

## 2016-05-01 DIAGNOSIS — I10 Essential (primary) hypertension: Secondary | ICD-10-CM

## 2016-05-01 NOTE — Progress Notes (Signed)
Subjective:    Patient ID: Jesse Fletcher., male    DOB: 03-Sep-1934, 81 y.o.   MRN: [id number]  HPI Here today for follow-up of his hypertension.  Patient has resumed lisinopril/hydrochlorothiazide, 2 tablets by mouth daily in addition to his metoprolol twice daily. His blood pressure at home is typically between [id number] and [id number] systolic over [id number] diastolic. He denies any chest pain shortness of breath or dyspnea on exertion. He did temporarily discontinue his statin after he saw my partner last time. His myalgias resolved. He is socially resume his statin and his myalgias have not returned. His most recent lab work as listed below: Appointment on 04/13/2016  Component Date Value Ref Range Status  . Cholesterol 04/13/2016 162  125 - 200 mg/dL Final  . Triglycerides 04/13/2016 104  <150 mg/dL Final  . HDL 04/13/2016 50  >=40 mg/dL Final  . Total CHOL/HDL Ratio 04/13/2016 3.2  <=5.0 Ratio Final  . VLDL 04/13/2016 21  <30 mg/dL Final  . LDL Cholesterol 04/13/2016 91  <130 mg/dL Final   Comment:   Total Cholesterol/HDL Ratio:CHD Risk                        Coronary Heart Disease Risk Table                                        Men       Women          1/2 Average Risk              3.4        3.3              Average Risk              5.0        4.4           2X Average Risk              9.6        7.1           3X Average Risk             23.4       11.0 Use the calculated Patient Ratio above and the CHD Risk table  to determine the patient's CHD Risk.   . Hgb A1c MFr Bld 04/13/2016 5.4  <5.7 % Final   Comment:   For the purpose of screening for the presence of diabetes:   <5.7%       Consistent with the absence of diabetes 5.7-6.4 %   Consistent with increased risk for diabetes (prediabetes) >=6.5 %     Consistent with diabetes   This assay result is consistent with a decreased risk of diabetes.   Currently, no consensus exists regarding use of hemoglobin A1c for diagnosis of  diabetes in children.   According to American Diabetes Association (ADA) guidelines, hemoglobin A1c <7.0% represents optimal control in non-pregnant diabetic patients. Different metrics may apply to specific patient populations. Standards of Medical Care in Diabetes (ADA).     . Mean Plasma Glucose 04/13/2016 108  mg/dL Final  . Sodium 04/13/2016 139  135 - 146 mmol/L Final  . Potassium 04/13/2016 4.5  3.5 - 5.3 mmol/L Final  . Chloride 04/13/2016 102  98 - 110 mmol/L Final  . CO2 04/13/2016  26  20 - 31 mmol/L Final  . Glucose, Bld 04/13/2016 111* 70 - 99 mg/dL Final  . BUN 04/13/2016 21  7 - 25 mg/dL Final  . Creat 04/13/2016 1.04  0.70 - 1.18 mg/dL Final   Comment:   For patients > or = 79 years of age: The upper reference limit for Creatinine is approximately 13% higher for people identified as African-American.     . Total Bilirubin 04/13/2016 0.8  0.2 - 1.2 mg/dL Final  . Alkaline Phosphatase 04/13/2016 59  40 - 115 U/L Final  . AST 04/13/2016 20  10 - 35 U/L Final  . ALT 04/13/2016 14  9 - 46 U/L Final  . Total Protein 04/13/2016 6.4  6.1 - 8.1 g/dL Final  . Albumin 04/13/2016 3.9  3.6 - 5.1 g/dL Final  . Calcium 04/13/2016 9.1  8.6 - 10.3 mg/dL Final  . GFR, Est African American 04/13/2016 79  >=60 mL/min Final  . GFR, Est Non African American 04/13/2016 68  >=60 mL/min Final    Past Medical History:  Diagnosis Date  . Blood in stool   . Hemorrhoids   . Hyperlipidemia   . Hypertension   . Rectal fissure   . Rectal pain    Past Surgical History:  Procedure Laterality Date  . HERNIA REPAIR  2000 (approx)  . lower back surgery  05/1995, 05/1998  . SHOULDER SURGERY  1997   both   Current Outpatient Prescriptions on File Prior to Visit  Medication Sig Dispense Refill  . aspirin 81 MG tablet Take 81 mg by mouth daily.      Marland Kitchen lisinopril-hydrochlorothiazide (PRINZIDE,ZESTORETIC) 10-12.5 MG per tablet Take 1 tablet by mouth 2 (two) times daily. (Patient taking  differently: Take 1 tablet by mouth daily. ) 180 tablet 2  . metoprolol tartrate (LOPRESSOR) 25 MG tablet TAKE ONE TABLET BY MOUTH TWICE DAILY 180 tablet 4  . Multiple Vitamins-Minerals (CENTRUM SILVER ADULT 50+ PO) Take 1 tablet by mouth daily.    . simvastatin (ZOCOR) 20 MG tablet TAKE ONE TABLET BY MOUTH AT BEDTIME. 90 tablet 1  . fluticasone (FLONASE) 50 MCG/ACT nasal spray Place 2 sprays into both nostrils daily. (Patient not taking: Reported on 05/01/2016) 16 g 1   No current facility-administered medications on file prior to visit.    No Known Allergies Social History   Social History  . Marital status: Widowed    Spouse name: N/A  . Number of children: N/A  . Years of education: N/A   Occupational History  . Not on file.   Social History Main Topics  . Smoking status: Never Smoker  . Smokeless tobacco: Never Used  . Alcohol use No  . Drug use: No  . Sexual activity: Not on file   Other Topics Concern  . Not on file   Social History Narrative  . No narrative on file      Review of Systems  All other systems reviewed and are negative.      Objective:   Physical Exam  Cardiovascular: Normal rate, regular rhythm and normal heart sounds.   Pulmonary/Chest: Effort normal and breath sounds normal. No respiratory distress. He has no wheezes. He has no rales.  Abdominal: Soft. Bowel sounds are normal. He exhibits no distension. There is no tenderness. There is no rebound and no guarding.  Vitals reviewed.         Assessment & Plan:  Pure hypercholesterolemia  Essential hypertension  Blood pressure is well controlled.  Continue lisinopril as well as metoprolol at the present dosages. Cholesterol is well controlled. I will make no changes in his medication at this time. Fasting blood sugar was elevated but hemoglobin A1c during. Recheck in 6 months

## 2016-05-08 DIAGNOSIS — L570 Actinic keratosis: Secondary | ICD-10-CM | POA: Diagnosis not present

## 2016-06-01 ENCOUNTER — Other Ambulatory Visit: Payer: Self-pay | Admitting: Family Medicine

## 2016-06-27 ENCOUNTER — Encounter: Payer: Self-pay | Admitting: Family Medicine

## 2016-06-27 ENCOUNTER — Ambulatory Visit (INDEPENDENT_AMBULATORY_CARE_PROVIDER_SITE_OTHER): Payer: Medicare Other | Admitting: Family Medicine

## 2016-06-27 VITALS — BP 126/64 | HR 80 | Temp 100.3°F | Resp 16 | Ht 74.0 in | Wt 205.0 lb

## 2016-06-27 DIAGNOSIS — R509 Fever, unspecified: Secondary | ICD-10-CM

## 2016-06-27 LAB — INFLUENZA A AND B AG, IMMUNOASSAY
INFLUENZA A ANTIGEN: NOT DETECTED
Influenza B Antigen: NOT DETECTED

## 2016-06-27 MED ORDER — OSELTAMIVIR PHOSPHATE 75 MG PO CAPS: 75.0000 mg | ORAL_CAPSULE | Freq: Two times a day (BID) | ORAL | 0 refills | Status: DC

## 2016-06-27 NOTE — Progress Notes (Signed)
Subjective:    Patient ID: Jesse Doyne., male    DOB: Apr 01, 1935, 81 y.o.   MRN: [id number]  HPI  Symptoms began less than 24 hours ago. He reports subjective fever at home and diffuse body aches and a dull headache. He denies any rhinorrhea. He denies any sore throat. He denies any cough. He denies any sinus pain. He denies any nausea vomiting or diarrhea. He denies any rash. He denies any dysuria frequency urgency or urinary hesitancy. Therefore symptoms are most consistent with a viral syndrome similar to the flu. Past Medical History:  Diagnosis Date  . Blood in stool   . Hemorrhoids   . Hyperlipidemia   . Hypertension   . Rectal fissure   . Rectal pain    Past Surgical History:  Procedure Laterality Date  . HERNIA REPAIR  2000 (approx)  . lower back surgery  05/1995, 05/1998  . SHOULDER SURGERY  1997   both   Current Outpatient Prescriptions on File Prior to Visit  Medication Sig Dispense Refill  . aspirin 81 MG tablet Take 81 mg by mouth daily.      Marland Kitchen lisinopril-hydrochlorothiazide (PRINZIDE,ZESTORETIC) 10-12.5 MG tablet TAKE ONE TABLET BY MOUTH TWICE DAILY 180 tablet 2  . metoprolol tartrate (LOPRESSOR) 25 MG tablet TAKE ONE TABLET BY MOUTH TWICE DAILY 180 tablet 4  . Multiple Vitamins-Minerals (CENTRUM SILVER ADULT 50+ PO) Take 1 tablet by mouth daily.    . simvastatin (ZOCOR) 20 MG tablet TAKE ONE TABLET BY MOUTH AT BEDTIME. 90 tablet 1   No current facility-administered medications on file prior to visit.    No Known Allergies Social History   Social History  . Marital status: Widowed    Spouse name: N/A  . Number of children: N/A  . Years of education: N/A   Occupational History  . Not on file.   Social History Main Topics  . Smoking status: Never Smoker  . Smokeless tobacco: Never Used  . Alcohol use No  . Drug use: No  . Sexual activity: Not on file   Other Topics Concern  . Not on file   Social History Narrative  . No narrative on file      Review of Systems  All other systems reviewed and are negative.      Objective:   Physical Exam  Constitutional: He appears well-developed and well-nourished. No distress.  HENT:  Head: Normocephalic.  Right Ear: External ear normal.  Left Ear: External ear normal.  Nose: Nose normal.  Mouth/Throat: Oropharynx is clear and moist. No oropharyngeal exudate.  Eyes: Conjunctivae and EOM are normal. Pupils are equal, round, and reactive to light.  Neck: Neck supple.  Cardiovascular: Normal rate, regular rhythm and normal heart sounds.   Pulmonary/Chest: Effort normal and breath sounds normal. No respiratory distress. He has no wheezes. He has no rales.  Abdominal: Soft. Bowel sounds are normal. He exhibits no distension. There is no tenderness. There is no rebound.  Lymphadenopathy:    He has no cervical adenopathy.  Skin: No rash noted. He is not diaphoretic. No erythema.  Vitals reviewed.         Assessment & Plan:  Fever, unspecified fever cause - Plan: Influenza A and B Ag, Immunoassay  Treat the patient empirically for the flu with Tamiflu 75 mg by mouth twice a day for 5 days. Use ibuprofen 800 mg every 8 hours as needed for fever or pain or body aches. Await the results of the flu  test.

## 2016-07-03 ENCOUNTER — Encounter: Payer: Self-pay | Admitting: Family Medicine

## 2016-07-03 ENCOUNTER — Ambulatory Visit (INDEPENDENT_AMBULATORY_CARE_PROVIDER_SITE_OTHER): Payer: Medicare Other | Admitting: Family Medicine

## 2016-07-03 VITALS — BP 118/74 | HR 80 | Temp 97.9°F | Resp 16 | Ht 74.0 in | Wt 203.0 lb

## 2016-07-03 DIAGNOSIS — J111 Influenza due to unidentified influenza virus with other respiratory manifestations: Secondary | ICD-10-CM

## 2016-07-03 LAB — CBC WITH DIFFERENTIAL/PLATELET
BASOS ABS: 0 {cells}/uL (ref 0–200)
Basophils Relative: 0 %
EOS PCT: 1 %
Eosinophils Absolute: 139 cells/uL (ref 15–500)
HCT: 45.2 % (ref 38.5–50.0)
Hemoglobin: 14.9 g/dL (ref 13.0–17.0)
Lymphocytes Relative: 9 %
Lymphs Abs: 1251 cells/uL (ref 850–3900)
MCH: 29.2 pg (ref 27.0–33.0)
MCHC: 33 g/dL (ref 32.0–36.0)
MCV: 88.5 fL (ref 80.0–100.0)
MONOS PCT: 7 %
MPV: 9.8 fL (ref 7.5–12.5)
Monocytes Absolute: 973 cells/uL — ABNORMAL HIGH (ref 200–950)
NEUTROS PCT: 83 %
Neutro Abs: 11537 cells/uL — ABNORMAL HIGH (ref 1500–7800)
PLATELETS: 336 10*3/uL (ref 140–400)
RBC: 5.11 MIL/uL (ref 4.20–5.80)
RDW: 13.9 % (ref 11.0–15.0)
WBC: 13.9 10*3/uL — ABNORMAL HIGH (ref 3.8–10.8)

## 2016-07-03 NOTE — Progress Notes (Signed)
Subjective:    Patient ID: Jesse Fletcher., male    DOB: 18-Dec-1934, 82 y.o.   MRN: [id number]  HPI  Proximally 5 days ago, the patient was diagnosed with influenza or influenza-like syndrome and was started on Tamiflu. He states that he felt better for the next 3 days. However yesterday he started developing diffuse myalgias, generalized fatigue, and just generally feeling poorly. He denies any fevers or chills. He denies any chest pain or shortness of breath. He denies any pleurisy. He denies any angina. He denies any sore throat, rhinorrhea, or otalgia. He denies any nausea vomiting or diarrhea. He denies any rash. He denies any dysuria, hematuria. He denies any low back pain. Past Medical History:  Diagnosis Date  . Blood in stool   . Hemorrhoids   . Hyperlipidemia   . Hypertension   . Rectal fissure   . Rectal pain    Past Surgical History:  Procedure Laterality Date  . HERNIA REPAIR  2000 (approx)  . lower back surgery  05/1995, 05/1998  . SHOULDER SURGERY  1997   both   Current Outpatient Prescriptions on File Prior to Visit  Medication Sig Dispense Refill  . aspirin 81 MG tablet Take 81 mg by mouth daily.      Marland Kitchen lisinopril-hydrochlorothiazide (PRINZIDE,ZESTORETIC) 10-12.5 MG tablet TAKE ONE TABLET BY MOUTH TWICE DAILY 180 tablet 2  . metoprolol tartrate (LOPRESSOR) 25 MG tablet TAKE ONE TABLET BY MOUTH TWICE DAILY 180 tablet 4  . Multiple Vitamins-Minerals (CENTRUM SILVER ADULT 50+ PO) Take 1 tablet by mouth daily.    Marland Kitchen oseltamivir (TAMIFLU) 75 MG capsule Take 1 capsule (75 mg total) by mouth 2 (two) times daily. 10 capsule 0  . simvastatin (ZOCOR) 20 MG tablet TAKE ONE TABLET BY MOUTH AT BEDTIME. 90 tablet 1   No current facility-administered medications on file prior to visit.    No Known Allergies Social History   Social History  . Marital status: Widowed    Spouse name: N/A  . Number of children: N/A  . Years of education: N/A   Occupational History  . Not  on file.   Social History Main Topics  . Smoking status: Never Smoker  . Smokeless tobacco: Never Used  . Alcohol use No  . Drug use: No  . Sexual activity: Not on file   Other Topics Concern  . Not on file   Social History Narrative  . No narrative on file     Review of Systems  All other systems reviewed and are negative.      Objective:   Physical Exam  Constitutional: He is oriented to person, place, and time. He appears well-developed and well-nourished. No distress.  HENT:  Right Ear: External ear normal.  Left Ear: External ear normal.  Nose: Nose normal.  Mouth/Throat: Oropharynx is clear and moist. No oropharyngeal exudate.  Eyes: Conjunctivae are normal.  Neck: Neck supple.  Cardiovascular: Normal rate, regular rhythm and normal heart sounds.  Exam reveals no gallop and no friction rub.   No murmur heard. Pulmonary/Chest: Effort normal and breath sounds normal. No respiratory distress. He has no wheezes. He has no rales. He exhibits no tenderness.  Abdominal: Soft. Bowel sounds are normal. He exhibits no distension and no mass. There is no tenderness. There is no rebound and no guarding.  Musculoskeletal: He exhibits no edema or tenderness.  Lymphadenopathy:    He has no cervical adenopathy.  Neurological: He is alert and oriented to person,  place, and time. He exhibits normal muscle tone. Coordination normal.  Skin: No rash noted. He is not diaphoretic. No erythema.  Vitals reviewed.         Assessment & Plan:  Influenza with respiratory manifestation - Plan: CBC with Differential/Platelet, COMPLETE METABOLIC PANEL WITH GFR  Patient's exam today is completely normal and reassuring. I believe that he still dealing with the side effects of influenza. I will check a CBC and a CMP today to make sure there is no other metabolic abnormalities that can also explain his symptoms. If there is a significantly elevated white blood cell count, I would recommend a  chest x-ray and also undertake a workup to evaluate for other potential illnesses although his review of systems is reassuring. I did ask the patient to temporarily discontinue simvastatin due to his body aches until he feels better in case this is contributinggradual improvement over the next 5 days

## 2016-07-04 LAB — COMPLETE METABOLIC PANEL WITH GFR
ALK PHOS: 70 U/L (ref 40–115)
ALT: 18 U/L (ref 9–46)
AST: 17 U/L (ref 10–35)
Albumin: 3.9 g/dL (ref 3.6–5.1)
BUN: 24 mg/dL (ref 7–25)
CO2: 26 mmol/L (ref 20–31)
Calcium: 9.4 mg/dL (ref 8.6–10.3)
Chloride: 101 mmol/L (ref 98–110)
Creat: 0.84 mg/dL (ref 0.70–1.18)
GFR, EST NON AFRICAN AMERICAN: 83 mL/min (ref >=60)
GLUCOSE: 75 mg/dL (ref 70–99)
POTASSIUM: 4.7 mmol/L (ref 3.5–5.3)
SODIUM: 137 mmol/L (ref 135–146)
Total Bilirubin: 0.6 mg/dL (ref 0.2–1.2)
Total Protein: 6.6 g/dL (ref 6.1–8.1)

## 2016-07-16 ENCOUNTER — Encounter: Payer: Self-pay | Admitting: Gastroenterology

## 2016-07-25 ENCOUNTER — Encounter: Payer: Self-pay | Admitting: Family Medicine

## 2016-07-25 ENCOUNTER — Ambulatory Visit (INDEPENDENT_AMBULATORY_CARE_PROVIDER_SITE_OTHER): Payer: Medicare Other | Admitting: Family Medicine

## 2016-07-25 VITALS — BP 120/76 | HR 68 | Temp 97.9°F | Resp 14 | Ht 74.0 in | Wt 206.0 lb

## 2016-07-25 DIAGNOSIS — J0110 Acute frontal sinusitis, unspecified: Secondary | ICD-10-CM | POA: Diagnosis not present

## 2016-07-25 MED ORDER — AMOXICILLIN 500 MG PO CAPS: 500.0000 mg | ORAL_CAPSULE | Freq: Two times a day (BID) | ORAL | 0 refills | Status: DC

## 2016-07-25 NOTE — Patient Instructions (Signed)
Use nasal saline Or try the nasacort (sample) Mucinex

## 2016-07-25 NOTE — Progress Notes (Signed)
   Subjective:    Patient ID: Jesse Doyne., male    DOB: [birth year], 81 y.o.   MRN: [id number]  Patient presents for Illness (x5 days- sinus pressure, HA, nasal congestion )   Pt here with sinus pressure , little drainage sinus headache for past 5 days. Treated for Inlfuenza at end of December completed Tamiflu. No fever, no cough currently.  No sore throat    Review Of Systems:  GEN- denies fatigue, fever, weight loss,weakness, recent illness HEENT- denies eye drainage, change in vision, +nasal discharge, CVS- denies chest pain, palpitations RESP- denies SOB, cough, wheeze ABD- denies N/V, change in stools, abd pain Neuro- +headache, dizziness, syncope, seizure activity       Objective:    BP 120/76 (BP Location: Left Arm, Patient Position: Sitting, Cuff Size: Large)   Pulse 68   Temp 97.9 F (36.6 C) (Oral)   Resp 14   Ht 6\' 2"  (1.88 m)   Wt 206 lb (93.4 kg)   SpO2 99%   BMI 26.45 kg/m  GEN- NAD, alert and oriented x3 HEENT- PERRL, EOMI, non injected sclera, pink conjunctiva, MMM, oropharynx clear, TM clear bilat no effusion,  + frontal  sinus tenderness, inflammed turbinates,  Nasal drainage  Neck- Supple, no LAD,good ROM  CVS- RRR, no murmur RESP-CTAB EXT- No edema Pulses- Radial 2+          Assessment & Plan:      Problem List Items Addressed This Visit    None    Visit Diagnoses    Acute frontal sinusitis, recurrence not specified    -  Primary   Treat for sinusitis 2nd illness within a month of each other, given amox x 7 days, nmucinex, nasal saline,nasocort   Relevant Medications   amoxicillin (AMOXIL) 500 MG capsule      Note: This dictation was prepared with Dragon dictation along with smaller phrase technology. Any transcriptional errors that result from this process are unintentional.

## 2016-08-01 DIAGNOSIS — N411 Chronic prostatitis: Secondary | ICD-10-CM | POA: Diagnosis not present

## 2016-08-01 DIAGNOSIS — N401 Enlarged prostate with lower urinary tract symptoms: Secondary | ICD-10-CM | POA: Diagnosis not present

## 2016-08-01 DIAGNOSIS — R351 Nocturia: Secondary | ICD-10-CM | POA: Diagnosis not present

## 2016-08-06 DIAGNOSIS — L570 Actinic keratosis: Secondary | ICD-10-CM | POA: Diagnosis not present

## 2016-08-24 ENCOUNTER — Other Ambulatory Visit: Payer: Self-pay | Admitting: Family Medicine

## 2016-11-06 DIAGNOSIS — L814 Other melanin hyperpigmentation: Secondary | ICD-10-CM | POA: Diagnosis not present

## 2016-11-06 DIAGNOSIS — L570 Actinic keratosis: Secondary | ICD-10-CM | POA: Diagnosis not present

## 2016-11-06 DIAGNOSIS — D229 Melanocytic nevi, unspecified: Secondary | ICD-10-CM | POA: Diagnosis not present

## 2016-11-06 DIAGNOSIS — L821 Other seborrheic keratosis: Secondary | ICD-10-CM | POA: Diagnosis not present

## 2017-01-04 ENCOUNTER — Other Ambulatory Visit: Payer: Medicare Other

## 2017-01-04 DIAGNOSIS — I10 Essential (primary) hypertension: Secondary | ICD-10-CM

## 2017-01-04 DIAGNOSIS — E119 Type 2 diabetes mellitus without complications: Secondary | ICD-10-CM | POA: Diagnosis not present

## 2017-01-04 LAB — COMPLETE METABOLIC PANEL WITH GFR
ALBUMIN: 4.1 g/dL (ref 3.6–5.1)
ALK PHOS: 62 U/L (ref 40–115)
ALT: 15 U/L (ref 9–46)
AST: 18 U/L (ref 10–35)
BILIRUBIN TOTAL: 0.9 mg/dL (ref 0.2–1.2)
BUN: 23 mg/dL (ref 7–25)
CO2: 28 mmol/L (ref 20–31)
Calcium: 9.1 mg/dL (ref 8.6–10.3)
Chloride: 103 mmol/L (ref 98–110)
Creat: 1.19 mg/dL — ABNORMAL HIGH (ref 0.70–1.11)
GFR, Est African American: 66 mL/min (ref >=60)
GFR, Est Non African American: 57 mL/min — ABNORMAL LOW (ref >=60)
GLUCOSE: 103 mg/dL — AB (ref 70–99)
POTASSIUM: 4.2 mmol/L (ref 3.5–5.3)
SODIUM: 140 mmol/L (ref 135–146)
TOTAL PROTEIN: 6.4 g/dL (ref 6.1–8.1)

## 2017-01-04 LAB — LIPID PANEL
CHOLESTEROL: 132 mg/dL (ref <200)
HDL: 50 mg/dL (ref >40)
LDL Cholesterol: 63 mg/dL (ref <100)
TRIGLYCERIDES: 95 mg/dL (ref <150)
Total CHOL/HDL Ratio: 2.6 Ratio (ref <5.0)
VLDL: 19 mg/dL (ref <30)

## 2017-01-05 LAB — HEMOGLOBIN A1C
Hgb A1c MFr Bld: 5.6 % (ref <5.7)
MEAN PLASMA GLUCOSE: 114 mg/dL

## 2017-01-11 ENCOUNTER — Other Ambulatory Visit: Payer: Self-pay | Admitting: Family Medicine

## 2017-01-14 ENCOUNTER — Ambulatory Visit (INDEPENDENT_AMBULATORY_CARE_PROVIDER_SITE_OTHER): Payer: Medicare Other | Admitting: Family Medicine

## 2017-01-14 VITALS — BP 142/84 | HR 70 | Temp 98.0°F | Wt 206.0 lb

## 2017-01-14 DIAGNOSIS — I10 Essential (primary) hypertension: Secondary | ICD-10-CM

## 2017-01-14 DIAGNOSIS — E7800 Pure hypercholesterolemia, unspecified: Secondary | ICD-10-CM

## 2017-01-14 NOTE — Progress Notes (Signed)
`   Subjective:    Patient ID: [name]., male    DOB: 05-07-1935, 82 y.o.   MRN: [id number]  HPI  Appointment on 01/04/2017  Component Date Value Ref Range Status  . Cholesterol 01/04/2017 132  <200 mg/dL Final  . Triglycerides 01/04/2017 95  <150 mg/dL Final  . HDL 01/04/2017 50  >40 mg/dL Final  . Total CHOL/HDL Ratio 01/04/2017 2.6  <5.0 Ratio Final  . VLDL 01/04/2017 19  <30 mg/dL Final  . LDL Cholesterol 01/04/2017 63  <100 mg/dL Final  . Hgb A1c MFr Bld 01/04/2017 5.6  <5.7 % Final   Comment:   For the purpose of screening for the presence of diabetes:   <5.7%       Consistent with the absence of diabetes 5.7-6.4 %   Consistent with increased risk for diabetes (prediabetes) >=6.5 %     Consistent with diabetes   This assay result is consistent with a decreased risk of diabetes.   Currently, no consensus exists regarding use of hemoglobin A1c for diagnosis of diabetes in children.   According to American Diabetes Association (ADA) guidelines, hemoglobin A1c <7.0% represents optimal control in non-pregnant diabetic patients. Different metrics may apply to specific patient populations. Standards of Medical Care in Diabetes (ADA).     . Mean Plasma Glucose 01/04/2017 114  mg/dL Final  . Sodium 01/04/2017 140  135 - 146 mmol/L Final  . Potassium 01/04/2017 4.2  3.5 - 5.3 mmol/L Final  . Chloride 01/04/2017 103  98 - 110 mmol/L Final  . CO2 01/04/2017 28  20 - 31 mmol/L Final  . Glucose, Bld 01/04/2017 103* 70 - 99 mg/dL Final  . BUN 01/04/2017 23  7 - 25 mg/dL Final  . Creat 01/04/2017 1.19* 0.70 - 1.11 mg/dL Final   Comment:   For patients > or = 80 years of age: The upper reference limit for Creatinine is approximately 13% higher for people identified as African-American.     . Total Bilirubin 01/04/2017 0.9  0.2 - 1.2 mg/dL Final  . Alkaline Phosphatase 01/04/2017 62  40 - 115 U/L Final  . AST 01/04/2017 18  10 - 35 U/L Final  . ALT 01/04/2017 15  9 -  46 U/L Final  . Total Protein 01/04/2017 6.4  6.1 - 8.1 g/dL Final  . Albumin 01/04/2017 4.1  3.6 - 5.1 g/dL Final  . Calcium 01/04/2017 9.1  8.6 - 10.3 mg/dL Final  . GFR, Est African American 01/04/2017 66  >=60 mL/min Final  . GFR, Est Non African American 01/04/2017 57* >=60 mL/min Final   Patient is here today for a checkup. Has history of hypertension and hyperlipidemia. He is tolerating simvastatin without difficulty. He denies any myalgias or right upper quadrant pain. His blood pressure today is borderline however at home he states is much better and usually less than 140 on the top and less than 90 on the bottom. He denies any chest pain shortness of breath or dyspnea on exertion. Recently had an elevated blood sugar in October of last year. Hemoglobin A1c this time is excellent 5.6. There is a slight elevation in his renal function as his creatinine has increased from 0.8-1.2.  However he is exercising everyday in the hot sun and not drinking enough water Past Medical History:  Diagnosis Date  . Blood in stool   . Hemorrhoids   . Hyperlipidemia   . Hypertension   . Rectal fissure   . Rectal pain  Past Surgical History:  Procedure Laterality Date  . HERNIA REPAIR  2000 (approx)  . lower back surgery  05/1995, 05/1998  . SHOULDER SURGERY  1997   both   Current Outpatient Prescriptions on File Prior to Visit  Medication Sig Dispense Refill  . aspirin 81 MG tablet Take 81 mg by mouth daily.      Marland Kitchen lisinopril-hydrochlorothiazide (PRINZIDE,ZESTORETIC) 10-12.5 MG tablet TAKE ONE TABLET BY MOUTH TWICE DAILY 180 tablet 2  . metoprolol tartrate (LOPRESSOR) 25 MG tablet TAKE ONE TABLET BY MOUTH TWICE DAILY 180 tablet 4  . Multiple Vitamins-Minerals (CENTRUM SILVER ADULT 50+ PO) Take 1 tablet by mouth daily.    . simvastatin (ZOCOR) 20 MG tablet TAKE ONE TABLET BY MOUTH AT BEDTIME 90 tablet 1   No current facility-administered medications on file prior to visit.    No Known  Allergies Social History   Social History  . Marital status: Widowed    Spouse name: N/A  . Number of children: N/A  . Years of education: N/A   Occupational History  . Not on file.   Social History Main Topics  . Smoking status: Never Smoker  . Smokeless tobacco: Never Used  . Alcohol use No  . Drug use: No  . Sexual activity: Not on file   Other Topics Concern  . Not on file   Social History Narrative  . No narrative on file     Review of Systems  All other systems reviewed and are negative.      Objective:   Physical Exam  Constitutional: He appears well-developed and well-nourished. No distress.  HENT:  Head: Normocephalic.  Right Ear: External ear normal.  Left Ear: External ear normal.  Nose: Nose normal.  Mouth/Throat: Oropharynx is clear and moist. No oropharyngeal exudate.  Eyes: Pupils are equal, round, and reactive to light. Conjunctivae and EOM are normal.  Neck: Neck supple.  Cardiovascular: Normal rate, regular rhythm and normal heart sounds.   Pulmonary/Chest: Effort normal and breath sounds normal. No respiratory distress. He has no wheezes. He has no rales.  Abdominal: Soft. Bowel sounds are normal. He exhibits no distension. There is no tenderness. There is no rebound.  Lymphadenopathy:    He has no cervical adenopathy.  Skin: No rash noted. He is not diaphoretic. No erythema.  Vitals reviewed.         Assessment & Plan:  Pure hypercholesterolemia  Benign essential HTN  He does report some hearing loss in his left ear and has a cerumen impaction in the left ear which we easily removed with irrigation and lavage. Blood pressure today is borderline but he states it is much better at home. His lab work is excellent. I did recommend that he try to drink a little more water as his [place] filtration rate has decreased slightly during the hot summer months. Cholesterol is excellent. Blood sugar is better. We will make no changes in his  medication at this time and recheck in 6 months.

## 2017-02-18 ENCOUNTER — Other Ambulatory Visit: Payer: Self-pay | Admitting: Family Medicine

## 2017-02-24 ENCOUNTER — Other Ambulatory Visit: Payer: Self-pay | Admitting: Family Medicine

## 2017-02-25 DIAGNOSIS — M25561 Pain in right knee: Secondary | ICD-10-CM | POA: Diagnosis not present

## 2017-03-14 ENCOUNTER — Other Ambulatory Visit: Payer: Self-pay | Admitting: Dermatology

## 2017-03-14 DIAGNOSIS — C44729 Squamous cell carcinoma of skin of left lower limb, including hip: Secondary | ICD-10-CM | POA: Diagnosis not present

## 2017-03-14 DIAGNOSIS — C44722 Squamous cell carcinoma of skin of right lower limb, including hip: Secondary | ICD-10-CM | POA: Diagnosis not present

## 2017-03-14 DIAGNOSIS — L570 Actinic keratosis: Secondary | ICD-10-CM | POA: Diagnosis not present

## 2017-04-17 DIAGNOSIS — L570 Actinic keratosis: Secondary | ICD-10-CM | POA: Diagnosis not present

## 2017-04-17 DIAGNOSIS — Z85828 Personal history of other malignant neoplasm of skin: Secondary | ICD-10-CM | POA: Diagnosis not present

## 2017-04-17 DIAGNOSIS — L821 Other seborrheic keratosis: Secondary | ICD-10-CM | POA: Diagnosis not present

## 2017-04-19 ENCOUNTER — Ambulatory Visit (INDEPENDENT_AMBULATORY_CARE_PROVIDER_SITE_OTHER): Payer: Medicare Other

## 2017-04-19 DIAGNOSIS — Z23 Encounter for immunization: Secondary | ICD-10-CM

## 2017-04-19 NOTE — Progress Notes (Signed)
Patient received flu injection in left deltoid.patient tolerated well

## 2017-05-21 ENCOUNTER — Other Ambulatory Visit: Payer: Self-pay | Admitting: Family Medicine

## 2017-06-04 ENCOUNTER — Encounter (HOSPITAL_COMMUNITY): Payer: Self-pay | Admitting: Emergency Medicine

## 2017-06-04 ENCOUNTER — Ambulatory Visit (INDEPENDENT_AMBULATORY_CARE_PROVIDER_SITE_OTHER): Payer: Medicare Other | Admitting: Family Medicine

## 2017-06-04 ENCOUNTER — Encounter: Payer: Self-pay | Admitting: Family Medicine

## 2017-06-04 ENCOUNTER — Emergency Department (HOSPITAL_COMMUNITY): Payer: Medicare Other

## 2017-06-04 ENCOUNTER — Other Ambulatory Visit: Payer: Self-pay

## 2017-06-04 VITALS — BP 120/72 | HR 84 | Temp 98.0°F

## 2017-06-04 DIAGNOSIS — R079 Chest pain, unspecified: Secondary | ICD-10-CM | POA: Insufficient documentation

## 2017-06-04 DIAGNOSIS — R0789 Other chest pain: Secondary | ICD-10-CM

## 2017-06-04 DIAGNOSIS — Z5321 Procedure and treatment not carried out due to patient leaving prior to being seen by health care provider: Secondary | ICD-10-CM | POA: Insufficient documentation

## 2017-06-04 LAB — BASIC METABOLIC PANEL
ANION GAP: 9 (ref 5–15)
BUN: 21 mg/dL — ABNORMAL HIGH (ref 6–20)
CO2: 25 mmol/L (ref 22–32)
Calcium: 8.6 mg/dL — ABNORMAL LOW (ref 8.9–10.3)
Chloride: 100 mmol/L — ABNORMAL LOW (ref 101–111)
Creatinine, Ser: 1.09 mg/dL (ref 0.61–1.24)
GFR calc Af Amer: >60 mL/min (ref >60)
GLUCOSE: 139 mg/dL — AB (ref 65–99)
POTASSIUM: 3.6 mmol/L (ref 3.5–5.1)
Sodium: 134 mmol/L — ABNORMAL LOW (ref 135–145)

## 2017-06-04 LAB — CBC
HEMATOCRIT: 42.9 % (ref 39.0–52.0)
HEMOGLOBIN: 14.4 g/dL (ref 13.0–17.0)
MCH: 29.8 pg (ref 26.0–34.0)
MCHC: 33.6 g/dL (ref 30.0–36.0)
MCV: 88.6 fL (ref 78.0–100.0)
Platelets: 229 10*3/uL (ref 150–400)
RBC: 4.84 MIL/uL (ref 4.22–5.81)
RDW: 13.2 % (ref 11.5–15.5)
WBC: 11 10*3/uL — AB (ref 4.0–10.5)

## 2017-06-04 LAB — I-STAT TROPONIN, ED: TROPONIN I, POC: 0 ng/mL (ref 0.00–0.08)

## 2017-06-04 LAB — TROPONIN I

## 2017-06-04 NOTE — ED Triage Notes (Signed)
Pt c/o 6/10 central cp started 30 min ago, no nausea or vomiting no dizziness. Pt AO x 4 NAD noticed.

## 2017-06-04 NOTE — Progress Notes (Signed)
Subjective:    Patient ID: Jesse Doyne., male    DOB: [birth year], 82 y.o.   MRN: [id number]  HPI  Patient is an 82 year old white male who has a past medical history of hypertension and hyperlipidemia who presents with 1 hour of chest pain.  He states that he has been sick over the last several days with a stomach virus.  He has been experiencing nausea and excessive belching and copious diarrhea.  Approximately 1 hour ago while sitting and watching television, he developed a pain substernally that radiates from his sternum down to his umbilicus.  There are no exacerbating or alleviating factors.  He rates the pain as a 5 on a scale of 1-10.  There is no shortness of breath.  He has experienced is no diaphoresis.  There is no radiation of the pain to his arm.  There is no exacerbation of the pain with activity.  He describes the pain as an indigestion-like pain.  He has tried over-the-counter Tums with no relief.  EKG today in the office shows normal sinus rhythm.  There are no concerning features such as ST segment elevation or depression.  There are no significant Q waves.  EKG is unremarkable and reassuring. Past Medical History:  Diagnosis Date  . Blood in stool   . Hemorrhoids   . Hyperlipidemia   . Hypertension   . Rectal fissure   . Rectal pain    Past Surgical History:  Procedure Laterality Date  . HERNIA REPAIR  2000 (approx)  . lower back surgery  05/1995, 05/1998  . SHOULDER SURGERY  1997   both   Current Outpatient Medications on File Prior to Visit  Medication Sig Dispense Refill  . aspirin 81 MG tablet Take 81 mg by mouth daily.      Marland Kitchen lisinopril-hydrochlorothiazide (PRINZIDE,ZESTORETIC) 10-12.5 MG tablet TAKE ONE TABLET BY MOUTH TWICE DAILY 180 tablet 2  . metoprolol tartrate (LOPRESSOR) 25 MG tablet TAKE ONE TABLET BY MOUTH TWICE DAILY 180 tablet 4  . Multiple Vitamins-Minerals (CENTRUM SILVER ADULT 50+ PO) Take 1 tablet by mouth daily.    . simvastatin (ZOCOR) 20  MG tablet TAKE 1 TABLET BY MOUTH AT BEDTIME 90 tablet 1   No current facility-administered medications on file prior to visit.    No Known Allergies Social History   Socioeconomic History  . Marital status: Widowed    Spouse name: Not on file  . Number of children: Not on file  . Years of education: Not on file  . Highest education level: Not on file  Social Needs  . Financial resource strain: Not on file  . Food insecurity - worry: Not on file  . Food insecurity - inability: Not on file  . Transportation needs - medical: Not on file  . Transportation needs - non-medical: Not on file  Occupational History  . Not on file  Tobacco Use  . Smoking status: Never Smoker  . Smokeless tobacco: Never Used  Substance and Sexual Activity  . Alcohol use: No  . Drug use: No  . Sexual activity: Not on file  Other Topics Concern  . Not on file  Social History Narrative  . Not on file     Review of Systems  All other systems reviewed and are negative.      Objective:   Physical Exam  Constitutional: He appears well-developed and well-nourished.  Neck: No JVD present.  Cardiovascular: Normal rate, regular rhythm and normal heart sounds. Exam reveals  no gallop and no friction rub.  No murmur heard. Pulmonary/Chest: Effort normal and breath sounds normal. No respiratory distress. He has no wheezes. He has no rales. He exhibits no tenderness.  Abdominal: Soft. Bowel sounds are normal. He exhibits no distension. There is no tenderness. There is no rebound and no guarding.  Musculoskeletal: He exhibits no edema.  Vitals reviewed.         Assessment & Plan:  Chest pain, unspecified type - Plan: EKG 12-Lead  Atypical chest pain  Chest pain is extremely atypical.  I have a low index of suspicion for cardiac source.  Risk factors however do include his age, his hypertension, and his hyperlipidemia.  I suspect that this is GI in nature.  Therefore I gave the patient a GI cocktail of  15 mL's of viscous lidocaine mixed with 15 mL's of Maalox. The patient took this mixture at approximately 5:00 and then was monitored in clinic for a total of 30 minutes.  Labs were drawn and I will send off a stat troponin.  After taking the GI cocktail, the patient states that his chest pain is more than 50% better and is now a 1-2.  He feels comfortable going home.  We will start nexium 40 mg a day.  We discussed at length going to the emergency room for an initial troponin.  Patient feels that this is unlikely to be his heart and I agree with him.  He declines going to the emergency room.  He promises me that if the chest pain comes back or changes he will go straight to the hospital.

## 2017-06-05 ENCOUNTER — Emergency Department (HOSPITAL_COMMUNITY)
Admission: EM | Admit: 2017-06-05 | Discharge: 2017-06-05 | Discharge status: Against Medical Advice | Payer: Medicare Other | Attending: Emergency Medicine | Admitting: Emergency Medicine

## 2017-06-05 NOTE — ED Notes (Signed)
No answer x3 for treatment room

## 2017-06-07 ENCOUNTER — Ambulatory Visit: Payer: Medicare Other | Admitting: Family Medicine

## 2017-06-12 ENCOUNTER — Telehealth: Payer: Self-pay | Admitting: Family Medicine

## 2017-06-12 DIAGNOSIS — R0789 Other chest pain: Secondary | ICD-10-CM

## 2017-06-12 NOTE — Telephone Encounter (Signed)
Referral placed.

## 2017-06-12 NOTE — Telephone Encounter (Signed)
Consult cardiology (atypical chest pain)

## 2017-06-12 NOTE — Telephone Encounter (Signed)
Pt wants [place] to go ahead and schedule the stress test. Please put order in so kim can schedule.

## 2017-06-12 NOTE — Telephone Encounter (Signed)
OK to order 

## 2017-06-17 ENCOUNTER — Ambulatory Visit (INDEPENDENT_AMBULATORY_CARE_PROVIDER_SITE_OTHER): Payer: Medicare Other | Admitting: Cardiology

## 2017-06-17 ENCOUNTER — Encounter: Payer: Self-pay | Admitting: Cardiology

## 2017-06-17 VITALS — BP 138/90 | HR 68 | Resp 16 | Ht 74.0 in | Wt 202.0 lb

## 2017-06-17 DIAGNOSIS — R0609 Other forms of dyspnea: Secondary | ICD-10-CM

## 2017-06-17 DIAGNOSIS — I10 Essential (primary) hypertension: Secondary | ICD-10-CM | POA: Diagnosis not present

## 2017-06-17 DIAGNOSIS — E7800 Pure hypercholesterolemia, unspecified: Secondary | ICD-10-CM | POA: Diagnosis not present

## 2017-06-17 DIAGNOSIS — R079 Chest pain, unspecified: Secondary | ICD-10-CM | POA: Insufficient documentation

## 2017-06-17 DIAGNOSIS — R0789 Other chest pain: Secondary | ICD-10-CM | POA: Diagnosis not present

## 2017-06-17 NOTE — Patient Instructions (Signed)
Medication Instructions:  Your physician recommends that you continue on your current medications as directed. Please refer to the Current Medication list given to you today.  Labwork: None ordered  Testing/Procedures: Your physician has requested that you have an echocardiogram. Echocardiography is a painless test that uses sound waves to create images of your heart. It provides your doctor with information about the size and shape of your heart and how well your heart's chambers and valves are working. This procedure takes approximately one hour. There are no restrictions for this procedure.  Your physician has requested that you have a stress echocardiogram. For further information please visit [email or web address]. Please follow instruction sheet as given.  Follow-Up: Your physician recommends that you schedule a follow-up appointment in: 1 month with Dr. Agustin Cree   Any Other Special Instructions Will Be Listed Below (If Applicable).  Please note that any paperwork needing to be filled out by the provider will need to be addressed at the front desk prior to seeing the provider. Please note that any paperwork FMLA, Disability or other documents regarding health condition is subject to a $25.00 charge that must be received prior to completion of paperwork in the form of a money order or check.   If you need a refill on your cardiac medications before your next appointment, please call your pharmacy.

## 2017-06-17 NOTE — Progress Notes (Signed)
Cardiology Consultation:    Date:  06/17/2017   ID:  Jesse Doyne., DOB 03-16-1935, MRN [id number]  PCP:  Susy Frizzle, MD  Cardiologist:  Jenne Campus, MD   Referring MD: Susy Frizzle, MD   Chief Complaint  Patient presents with  . Shortness of Breath  I had some chest pain and shortness of breath  History of Present Illness:    Jesse Allender. is a 82 y.o. male who is being seen today for the evaluation of pain at the request of Jenna Luo T, MD.  About 10 days ago he was sitting and watching TV and started having some what he calls indigestion it was kind of burning in the middle of the chest.  Without radiation.  There was no shortness of breath no sweating associated with the sensation he took some Rolaids without relief and then eventually decided to go to his primary care physician.  Primary care physician did a very excellent assessment that included EKG which showed no acute changes, stat troponin I was sent out which was negative.  He also was given a GI cocktail.  After that he started feeling better but not completely and eventually ended up going to the emergency room the same day.  Again evaluation was negative.  He did have mild elevation of white blood cell count. He is a very active gentleman.  Every day he walks for about 45 minutes.  He cannot tell me what distance he does however his son who is with him telling me that he walks very fast that he cannot keep up with him.  Denies having any problems doing that but described to have some fatigue and shortness of breath lately while doing it.  Also a few days ago he was cleaning some trees and fell on his property and he became severely short of breath while doing that.  However however, he admits that effort was unusually hard. His risk factors for coronary artery disease include his age, hypertension, dyslipidemia likely all his risk factors being excellently managed by his primary care  physician.  Past Medical History:  Diagnosis Date  . Blood in stool   . Hemorrhoids   . Hyperlipidemia   . Hypertension   . Rectal fissure   . Rectal pain     Past Surgical History:  Procedure Laterality Date  . HERNIA REPAIR  2000 (approx)  . lower back surgery  05/1995, 05/1998  . SHOULDER SURGERY  1997   both    Current Medications: Current Meds  Medication Sig  . aspirin 81 MG tablet Take 81 mg by mouth daily.    Marland Kitchen lisinopril-hydrochlorothiazide (PRINZIDE,ZESTORETIC) 10-12.5 MG tablet TAKE ONE TABLET BY MOUTH TWICE DAILY  . metoprolol tartrate (LOPRESSOR) 25 MG tablet TAKE ONE TABLET BY MOUTH TWICE DAILY  . Multiple Vitamins-Minerals (CENTRUM SILVER ADULT 50+ PO) Take 1 tablet by mouth daily.  . simvastatin (ZOCOR) 20 MG tablet TAKE 1 TABLET BY MOUTH AT BEDTIME     Allergies:   Patient has no known allergies.   Social History   Socioeconomic History  . Marital status: Widowed    Spouse name: None  . Number of children: None  . Years of education: None  . Highest education level: None  Social Needs  . Financial resource strain: None  . Food insecurity - worry: None  . Food insecurity - inability: None  . Transportation needs - medical: None  . Transportation needs - non-medical: None  Occupational History  . None  Tobacco Use  . Smoking status: Never Smoker  . Smokeless tobacco: Never Used  Substance and Sexual Activity  . Alcohol use: No  . Drug use: No  . Sexual activity: None  Other Topics Concern  . None  Social History Narrative  . None     Family History: The patient's family history is not on file. ROS:   Please see the history of present illness.    All 14 point review of systems negative except as described per history of present illness.  EKGs/Labs/Other Studies Reviewed:    The following studies were reviewed today: I review all laboratory tests from the hospital.  Interesting EKG description says a right bundle branch block however  EKG done today in the office showed normal sinus rhythm at rate of 73, there is first-degree AV block.  There is poor R wave progression anterior precordium rising suspicion for anterior wall microinfarction.  There are no acute or worrisome ST segment changes.    Recent Labs: 01/04/2017: ALT 15 06/04/2017: BUN 21; Creatinine, Ser 1.09; Hemoglobin 14.4; Platelets 229; Potassium 3.6; Sodium 134  Recent Lipid Panel    Component Value Date/Time   CHOL 132 01/04/2017 0758   TRIG 95 01/04/2017 0758   HDL 50 01/04/2017 0758   CHOLHDL 2.6 01/04/2017 0758   VLDL 19 01/04/2017 0758   LDLCALC 63 01/04/2017 0758    Physical Exam:    VS:  BP 138/90 (BP Location: Right Arm, Patient Position: Sitting, Cuff Size: Normal)   Pulse 68   Resp 16   Ht 6\' 2"  (1.88 m)   Wt 202 lb (91.6 kg)   SpO2 97%   BMI 25.94 kg/m     Wt Readings from Last 3 Encounters:  06/17/17 202 lb (91.6 kg)  06/04/17 205 lb (93 kg)  01/14/17 206 lb (93.4 kg)     GEN:  Well nourished, well developed in no acute distress HEENT: Normal NECK: No JVD; No carotid bruits LYMPHATICS: No lymphadenopathy CARDIAC: RRR, no murmurs, no rubs, no gallops RESPIRATORY:  Clear to auscultation without rales, wheezing or rhonchi  ABDOMEN: Soft, non-tender, non-distended MUSCULOSKELETAL:  No edema; No deformity  SKIN: Warm and dry NEUROLOGIC:  Alert and oriented x 3 PSYCHIATRIC:  Normal affect   ASSESSMENT:    1. Essential hypertension   2. Pure hypercholesterolemia   3. Atypical chest pain   4. Dyspnea on exertion    PLAN:    In order of problems listed above:  1. Atypical chest pain.  Not related to exertion lasting more than 30 minutes biochemical markers are normal, no acute ST segment changes.  Partially relieved by GI medications.  Still considering his risk factor for coronary artery disease I would propose to do echocardiogram to check his left ventricular ejection fraction.  Especially in view of the fact that his EKG  showing possibility of anterior wall microinfarction.  He denies having any trouble with his heart but this is something that need to be verified.  To rule out potential ischemia as a source of his pain I will schedule him to have the stress test.  In the meantime we will continue with aspirin rest of his medications including statin. 2. Dyslipidemia: We will continue with statin. 3. Dyspnea on exertion: Echocardiogram as well as stress test will help to clarify that the symptomatology is related to his heart condition. 4. Essential hypertension: Blood pressure well controlled today and will continue monitoring.   Medication  Adjustments/Labs and Tests Ordered: Current medicines are reviewed at length with the patient today.  Concerns regarding medicines are outlined above.  No orders of the defined types were placed in this encounter.  No orders of the defined types were placed in this encounter.   Signed, Park Liter, MD, [facility]. 06/17/2017 3:45 PM    [facility]

## 2017-06-19 ENCOUNTER — Ambulatory Visit: Payer: Medicare Other | Admitting: Cardiology

## 2017-07-01 ENCOUNTER — Telehealth (HOSPITAL_COMMUNITY): Payer: Self-pay | Admitting: *Deleted

## 2017-07-01 NOTE — Telephone Encounter (Signed)
Patient given detailed instructions per Stress echo Study Information Sheet for the test on 07/03/17 at 230. Patient notified to arrive 15 minutes early and that it is imperative to arrive on time for appointment to keep from having the test rescheduled.  If you need to cancel or reschedule your appointment, please call the office within 24 hours of your appointment. . Patient verbalized understanding.Irish Piech, Ranae [place]

## 2017-07-03 ENCOUNTER — Ambulatory Visit (HOSPITAL_COMMUNITY): Payer: Medicare Other

## 2017-07-03 ENCOUNTER — Ambulatory Visit (HOSPITAL_COMMUNITY): Payer: Medicare Other | Attending: Cardiovascular Disease

## 2017-07-03 ENCOUNTER — Ambulatory Visit (HOSPITAL_BASED_OUTPATIENT_CLINIC_OR_DEPARTMENT_OTHER): Payer: Medicare Other

## 2017-07-03 ENCOUNTER — Other Ambulatory Visit (HOSPITAL_COMMUNITY): Payer: Medicare Other

## 2017-07-03 ENCOUNTER — Other Ambulatory Visit: Payer: Self-pay

## 2017-07-03 DIAGNOSIS — R0789 Other chest pain: Secondary | ICD-10-CM

## 2017-07-03 DIAGNOSIS — R0609 Other forms of dyspnea: Secondary | ICD-10-CM

## 2017-07-03 DIAGNOSIS — E7800 Pure hypercholesterolemia, unspecified: Secondary | ICD-10-CM | POA: Diagnosis not present

## 2017-07-03 DIAGNOSIS — I10 Essential (primary) hypertension: Secondary | ICD-10-CM

## 2017-07-03 DIAGNOSIS — I77810 Thoracic aortic ectasia: Secondary | ICD-10-CM | POA: Insufficient documentation

## 2017-07-03 DIAGNOSIS — I119 Hypertensive heart disease without heart failure: Secondary | ICD-10-CM | POA: Insufficient documentation

## 2017-07-03 DIAGNOSIS — R079 Chest pain, unspecified: Secondary | ICD-10-CM | POA: Diagnosis present

## 2017-07-06 NOTE — Progress Notes (Signed)
Cardiology Office Note  Date:  07/08/2017   ID:  Jesse Doyne., DOB October 11, 1934, MRN [id number]  PCP:  Susy Frizzle, MD   Chief Complaint  Patient presents with  . OTHER    Discuss stress echo c/o chest pain and sob. Meds reviewed verbally with pt.    HPI:  Jesse Fletcher. is a 83 y.o. male with PMH of  Chest pain HTN hyperlipidemia  sitting and watching TV and started having some what he calls indigestion it was kind of burning in the middle of the chest.  Without radiation.     took some Rolaids without relief  Seen by PMD  EKG which showed no acute changes,  stat troponin I  negative.   GI cocktail.  After that he started feeling better but not completely and eventually ended up going to the emergency room Hospital records reviewed with the patient in detail mild elevation of white blood cell count  Recent episode of SOB moving snow, moving branches Unusually hard activity Based on the symptoms echocardiogram and stress echo was ordered  Each of the studies was reviewed with him in detail Low exercise tolerance No wall motion abnormality concerning for ischemia Mildly dilated aortic root 38 mm  walks for about 45 minutes on a regular basis, flat surface   risk factors for coronary artery disease include his age, hypertension, dyslipidemia   EKG personally reviewed by myself on todays visit Shows normal sinus rhythm rate 64 bpm no significant ST or T wave changes   PMH:   has a past medical history of Blood in stool, Hemorrhoids, Hyperlipidemia, Hypertension, Rectal fissure, and Rectal pain.  PSH:    Past Surgical History:  Procedure Laterality Date  . HERNIA REPAIR  2000 (approx)  . lower back surgery  05/1995, 05/1998  . SHOULDER SURGERY  1997   both    Current Outpatient Medications  Medication Sig Dispense Refill  . aspirin 81 MG tablet Take 81 mg by mouth daily.      Marland Kitchen lisinopril-hydrochlorothiazide (PRINZIDE,ZESTORETIC) 10-12.5 MG tablet TAKE  ONE TABLET BY MOUTH TWICE DAILY 180 tablet 2  . metoprolol tartrate (LOPRESSOR) 25 MG tablet TAKE ONE TABLET BY MOUTH TWICE DAILY 180 tablet 4  . Multiple Vitamins-Minerals (CENTRUM SILVER ADULT 50+ PO) Take 1 tablet by mouth daily.    . simvastatin (ZOCOR) 20 MG tablet TAKE 1 TABLET BY MOUTH AT BEDTIME 90 tablet 1   No current facility-administered medications for this visit.      Allergies:   Patient has no known allergies.   Social History:  The patient  reports that  has never smoked. he has never used smokeless tobacco. He reports that he does not drink alcohol or use drugs.   Family History:   family history is not on file.    Review of Systems: Review of Systems  Constitutional: Negative.   Respiratory: Negative.   Cardiovascular: Negative.   Gastrointestinal: Negative.   Musculoskeletal: Negative.   Neurological: Negative.   Psychiatric/Behavioral: Negative.   All other systems reviewed and are negative.    PHYSICAL EXAM: VS:  BP 138/74 (BP Location: Left Arm, Patient Position: Sitting, Cuff Size: Normal)   Pulse 64   Ht 6\' 2"  (1.88 m)   Wt 207 lb (93.9 kg)   BMI 26.58 kg/m  , BMI Body mass index is 26.58 kg/m. GEN: Well nourished, well developed, in no acute distress  HEENT: normal  Neck: no JVD, carotid bruits, or masses  Cardiac: RRR; no murmurs, rubs, or gallops,no edema  Respiratory:  clear to auscultation bilaterally, normal work of breathing GI: soft, nontender, nondistended, + BS MS: no deformity or atrophy  Skin: warm and dry, no rash Neuro:  Strength and sensation are intact Psych: euthymic mood, full affect    Recent Labs: 01/04/2017: ALT 15 06/04/2017: BUN 21; Creatinine, Ser 1.09; Hemoglobin 14.4; Platelets 229; Potassium 3.6; Sodium 134    Lipid Panel Lab Results  Component Value Date   CHOL 132 01/04/2017   HDL 50 01/04/2017   LDLCALC 63 01/04/2017   TRIG 95 01/04/2017      Wt Readings from Last 3 Encounters:  07/08/17 207 lb (93.9  kg)  06/17/17 202 lb (91.6 kg)  06/04/17 205 lb (93 kg)       ASSESSMENT AND PLAN:  Chest pain, unspecified type - Plan: EKG 12-Lead Atypical symptoms, recent normal echo and stress echo Long discussion with patient and family  who presents with him today If he continues to have any symptoms we could order CT coronary calcium scoring  Dyspnea on exertion - Plan: EKG 12-Lead Suspect secondary to heavy exertion in the setting of mild deconditioning Recommend he continue his walking program Call our office for any further symptoms  Pure hypercholesterolemia - Plan: EKG 12-Lead Cholesterol is at goal on the current lipid regimen. No changes to the medications were made.'  Essential hypertension - Plan: EKG 12-Lead Blood pressure is well controlled on today's visit. No changes made to the medications.  Disposition:   F/U  12 months  Recent testing above reviewed with him in detail All questions answered  Total encounter time more than 45 minutes  Greater than 50% was spent in counseling and coordination of care with the patient    Orders Placed This Encounter  Procedures  . EKG 12-Lead     Signed, Jesse Fletcher, M.D., Ph.D. 07/08/2017  [place], [place]

## 2017-07-08 ENCOUNTER — Ambulatory Visit (INDEPENDENT_AMBULATORY_CARE_PROVIDER_SITE_OTHER): Payer: Medicare Other | Admitting: Cardiovascular Disease

## 2017-07-08 ENCOUNTER — Encounter: Payer: Self-pay | Admitting: Cardiovascular Disease

## 2017-07-08 VITALS — BP 138/74 | HR 64 | Ht 74.0 in | Wt 207.0 lb

## 2017-07-08 DIAGNOSIS — E7800 Pure hypercholesterolemia, unspecified: Secondary | ICD-10-CM | POA: Diagnosis not present

## 2017-07-08 DIAGNOSIS — I10 Essential (primary) hypertension: Secondary | ICD-10-CM | POA: Diagnosis not present

## 2017-07-08 DIAGNOSIS — R0602 Shortness of breath: Secondary | ICD-10-CM

## 2017-07-08 DIAGNOSIS — R079 Chest pain, unspecified: Secondary | ICD-10-CM | POA: Diagnosis not present

## 2017-07-08 DIAGNOSIS — R0609 Other forms of dyspnea: Secondary | ICD-10-CM

## 2017-07-08 NOTE — Patient Instructions (Signed)
Medication Instructions:   No medication changes made  Labwork:  No new labs needed  Testing/Procedures:  We will order a CT coronary calcium score Shortness of breath, chest tightness $150   Follow-Up: It was a pleasure seeing you in the office today. Please call us if you have new issues that need to be addressed before your next appt.  [phone]  Your physician wants you to follow-up in: 12 months.  You will receive a reminder letter in the mail two months in advance. If you don't receive a letter, please call our office to schedule the follow-up appointment.  If you need a refill on your cardiac medications before your next appointment, please call your pharmacy.

## 2017-07-17 ENCOUNTER — Ambulatory Visit (INDEPENDENT_AMBULATORY_CARE_PROVIDER_SITE_OTHER)
Admission: RE | Admit: 2017-07-17 | Discharge: 2017-07-17 | Disposition: A | Discharge status: Home / Self Care | Payer: Self-pay | Source: Ambulatory Visit | Attending: Cardiovascular Disease | Admitting: Cardiovascular Disease

## 2017-07-17 DIAGNOSIS — R0602 Shortness of breath: Secondary | ICD-10-CM

## 2017-07-17 DIAGNOSIS — R079 Chest pain, unspecified: Secondary | ICD-10-CM

## 2017-07-18 DIAGNOSIS — L570 Actinic keratosis: Secondary | ICD-10-CM | POA: Diagnosis not present

## 2017-07-18 DIAGNOSIS — Z85828 Personal history of other malignant neoplasm of skin: Secondary | ICD-10-CM | POA: Diagnosis not present

## 2017-07-18 DIAGNOSIS — L905 Scar conditions and fibrosis of skin: Secondary | ICD-10-CM | POA: Diagnosis not present

## 2017-07-22 ENCOUNTER — Ambulatory Visit: Payer: Medicare Other | Admitting: Cardiology

## 2017-08-07 DIAGNOSIS — N411 Chronic prostatitis: Secondary | ICD-10-CM | POA: Diagnosis not present

## 2017-08-07 DIAGNOSIS — Z87442 Personal history of urinary calculi: Secondary | ICD-10-CM | POA: Diagnosis not present

## 2017-08-07 DIAGNOSIS — R351 Nocturia: Secondary | ICD-10-CM | POA: Diagnosis not present

## 2017-08-07 DIAGNOSIS — N401 Enlarged prostate with lower urinary tract symptoms: Secondary | ICD-10-CM | POA: Diagnosis not present

## 2017-10-17 ENCOUNTER — Other Ambulatory Visit: Payer: Medicare Other

## 2017-10-17 ENCOUNTER — Other Ambulatory Visit: Payer: Self-pay

## 2017-10-17 ENCOUNTER — Other Ambulatory Visit: Payer: Self-pay | Admitting: Family Medicine

## 2017-10-17 DIAGNOSIS — E785 Hyperlipidemia, unspecified: Secondary | ICD-10-CM

## 2017-10-17 DIAGNOSIS — I10 Essential (primary) hypertension: Secondary | ICD-10-CM

## 2017-10-17 DIAGNOSIS — E119 Type 2 diabetes mellitus without complications: Secondary | ICD-10-CM

## 2017-10-17 DIAGNOSIS — R7309 Other abnormal glucose: Secondary | ICD-10-CM | POA: Diagnosis not present

## 2017-10-18 LAB — COMPREHENSIVE METABOLIC PANEL
AG Ratio: 1.8 (calc) (ref 1.0–2.5)
ALBUMIN MSPROF: 4.1 g/dL (ref 3.6–5.1)
ALT: 18 U/L (ref 9–46)
AST: 25 U/L (ref 10–35)
Alkaline phosphatase (APISO): 62 U/L (ref 40–115)
BUN/Creatinine Ratio: 19 (calc) (ref 6–22)
BUN: 22 mg/dL (ref 7–25)
CO2: 29 mmol/L (ref 20–32)
CREATININE: 1.16 mg/dL — AB (ref 0.70–1.11)
Calcium: 9.6 mg/dL (ref 8.6–10.3)
Chloride: 102 mmol/L (ref 98–110)
Globulin: 2.3 g/dL (calc) (ref 1.9–3.7)
Glucose, Bld: 115 mg/dL — ABNORMAL HIGH (ref 65–99)
POTASSIUM: 4.7 mmol/L (ref 3.5–5.3)
SODIUM: 137 mmol/L (ref 135–146)
TOTAL PROTEIN: 6.4 g/dL (ref 6.1–8.1)
Total Bilirubin: 0.6 mg/dL (ref 0.2–1.2)

## 2017-10-18 LAB — LIPID PANEL
CHOL/HDL RATIO: 2.7 (calc) (ref <5.0)
CHOLESTEROL: 133 mg/dL (ref <200)
HDL: 49 mg/dL (ref >40)
LDL CHOLESTEROL (CALC): 68 mg/dL
Non-HDL Cholesterol (Calc): 84 mg/dL (calc) (ref <130)
TRIGLYCERIDES: 84 mg/dL (ref <150)

## 2017-10-18 LAB — HEMOGLOBIN A1C
EAG (MMOL/L): 6.6 (calc)
Hgb A1c MFr Bld: 5.8 % of total Hgb — ABNORMAL HIGH (ref <5.7)
MEAN PLASMA GLUCOSE: 120 (calc)

## 2017-10-21 ENCOUNTER — Encounter (INDEPENDENT_AMBULATORY_CARE_PROVIDER_SITE_OTHER): Payer: Self-pay

## 2017-10-24 ENCOUNTER — Ambulatory Visit (INDEPENDENT_AMBULATORY_CARE_PROVIDER_SITE_OTHER): Payer: Medicare Other | Admitting: Family Medicine

## 2017-10-24 ENCOUNTER — Encounter: Payer: Self-pay | Admitting: Family Medicine

## 2017-10-24 VITALS — BP 110/70 | HR 66 | Temp 97.6°F | Resp 14 | Ht 74.0 in | Wt 209.0 lb

## 2017-10-24 DIAGNOSIS — R7303 Prediabetes: Secondary | ICD-10-CM

## 2017-10-24 NOTE — Progress Notes (Signed)
`   Subjective:    Patient ID: Jesse Fletcher., male    DOB: 28-May-1935, 83 y.o.   MRN: [id number]  HPI  Lab on 10/17/2017  Component Date Value Ref Range Status  . Hgb A1c MFr Bld 10/17/2017 5.8* <5.7 % of total Hgb Final   Comment: For someone without known diabetes, a hemoglobin  A1c value between 5.7% and 6.4% is consistent with prediabetes and should be confirmed with a  follow-up test. . For someone with known diabetes, a value <7% indicates that their diabetes is well controlled. A1c targets should be individualized based on duration of diabetes, age, comorbid conditions, and other considerations. . This assay result is consistent with an increased risk of diabetes. . Currently, no consensus exists regarding use of hemoglobin A1c for diagnosis of diabetes for children. .   . Mean Plasma Glucose 10/17/2017 120  (calc) Final  . eAG (mmol/L) 10/17/2017 6.6  (calc) Final  . Glucose, Bld 10/17/2017 115* 65 - 99 mg/dL Final   Comment: .            Fasting reference interval . For someone without known diabetes, a glucose value between 100 and 125 mg/dL is consistent with prediabetes and should be confirmed with a follow-up test. .   . BUN 10/17/2017 22  7 - 25 mg/dL Final  . Creat 10/17/2017 1.16* 0.70 - 1.11 mg/dL Final   Comment: For patients >9 years of age, the reference limit for Creatinine is approximately 13% higher for people identified as African-American. .   Havery Moros Ratio 10/17/2017 19  6 - 22 (calc) Final  . Sodium 10/17/2017 137  135 - 146 mmol/L Final  . Potassium 10/17/2017 4.7  3.5 - 5.3 mmol/L Final  . Chloride 10/17/2017 102  98 - 110 mmol/L Final  . CO2 10/17/2017 29  20 - 32 mmol/L Final  . Calcium 10/17/2017 9.6  8.6 - 10.3 mg/dL Final  . Total Protein 10/17/2017 6.4  6.1 - 8.1 g/dL Final  . Albumin 10/17/2017 4.1  3.6 - 5.1 g/dL Final  . Globulin 10/17/2017 2.3  1.9 - 3.7 g/dL (calc) Final  . AG Ratio 10/17/2017 1.8  1.0 -  2.5 (calc) Final  . Total Bilirubin 10/17/2017 0.6  0.2 - 1.2 mg/dL Final  . Alkaline phosphatase (APISO) 10/17/2017 62  40 - 115 U/L Final  . AST 10/17/2017 25  10 - 35 U/L Final  . ALT 10/17/2017 18  9 - 46 U/L Final  . Cholesterol 10/17/2017 133  <200 mg/dL Final  . HDL 10/17/2017 49  >40 mg/dL Final  . Triglycerides 10/17/2017 84  <150 mg/dL Final  . LDL Cholesterol (Calc) 10/17/2017 68  mg/dL (calc) Final   Comment: Reference range: <100 . Desirable range <100 mg/dL for primary prevention;   <70 mg/dL for patients with CHD or diabetic patients  with > or = 2 CHD risk factors. Marland Kitchen LDL-C is now calculated using the Martin-Hopkins  calculation, which is a validated novel method providing  better accuracy than the Friedewald equation in the  estimation of LDL-C.  Cresenciano Genre et al. Annamaria Helling. 4098;119(14): 2061-2068  (http://education.[facility].com/faq/FAQ164)   . Total CHOL/HDL Ratio 10/17/2017 2.7  <5.0 (calc) Final  . Non-HDL Cholesterol (Calc) 10/17/2017 84  <130 mg/dL (calc) Final   Comment: For patients with diabetes plus 1 major ASCVD risk  factor, treating to a non-HDL-C goal of <100 mg/dL  (LDL-C of <70 mg/dL) is considered a therapeutic  option.  Patient is here today for a checkup. Has history of hypertension and hyperlipidemia.  He denies any chest pain shortness of breath or dyspnea on exertion.  He is exercising every day.  He admits to eating a diet rich in carbohydrates.  He is concerned based on his elevated fasting blood sugar.  He denies any myalgias or right upper quadrant pain.  He denies any polyuria, polydipsia, or blurry vision.  He is taking his medication without difficulty.  He denies any angina, orthopnea, or paroxysmal nocturnal dyspnea.  Recent stress test was normal. Past Medical History:  Diagnosis Date  . Blood in stool   . Hemorrhoids   . Hyperlipidemia   . Hypertension   . Rectal fissure   . Rectal pain    Past Surgical History:  Procedure  Laterality Date  . HERNIA REPAIR  2000 (approx)  . lower back surgery  05/1995, 05/1998  . SHOULDER SURGERY  1997   both   Current Outpatient Medications on File Prior to Visit  Medication Sig Dispense Refill  . aspirin 81 MG tablet Take 81 mg by mouth daily.      Marland Kitchen lisinopril-hydrochlorothiazide (PRINZIDE,ZESTORETIC) 10-12.5 MG tablet TAKE ONE TABLET BY MOUTH TWICE DAILY 180 tablet 2  . metoprolol tartrate (LOPRESSOR) 25 MG tablet TAKE ONE TABLET BY MOUTH TWICE DAILY 180 tablet 4  . Multiple Vitamins-Minerals (CENTRUM SILVER ADULT 50+ PO) Take 1 tablet by mouth daily.    . simvastatin (ZOCOR) 20 MG tablet TAKE 1 TABLET BY MOUTH AT BEDTIME 90 tablet 1   No current facility-administered medications on file prior to visit.    No Known Allergies Social History   Socioeconomic History  . Marital status: Widowed    Spouse name: Not on file  . Number of children: Not on file  . Years of education: Not on file  . Highest education level: Not on file  Occupational History  . Not on file  Social Needs  . Financial resource strain: Not on file  . Food insecurity:    Worry: Not on file    Inability: Not on file  . Transportation needs:    Medical: Not on file    Non-medical: Not on file  Tobacco Use  . Smoking status: Never Smoker  . Smokeless tobacco: Never Used  Substance and Sexual Activity  . Alcohol use: No  . Drug use: No  . Sexual activity: Not on file  Lifestyle  . Physical activity:    Days per week: Not on file    Minutes per session: Not on file  . Stress: Not on file  Relationships  . Social connections:    Talks on phone: Not on file    Gets together: Not on file    Attends religious service: Not on file    Active member of club or organization: Not on file    Attends meetings of clubs or organizations: Not on file    Relationship status: Not on file  . Intimate partner violence:    Fear of current or ex partner: Not on file    Emotionally abused: Not on  file    Physically abused: Not on file    Forced sexual activity: Not on file  Other Topics Concern  . Not on file  Social History Narrative  . Not on file     Review of Systems  All other systems reviewed and are negative.      Objective:   Physical Exam  Constitutional: He appears  well-developed and well-nourished. No distress.  HENT:  Head: Normocephalic.  Right Ear: External ear normal.  Left Ear: External ear normal.  Nose: Nose normal.  Mouth/Throat: Oropharynx is clear and moist. No oropharyngeal exudate.  Eyes: Pupils are equal, round, and reactive to light. Conjunctivae and EOM are normal.  Neck: Neck supple.  Cardiovascular: Normal rate, regular rhythm and normal heart sounds.  Pulmonary/Chest: Effort normal and breath sounds normal. No respiratory distress. He has no wheezes. He has no rales.  Abdominal: Soft. Bowel sounds are normal. He exhibits no distension. There is no tenderness. There is no rebound.  Lymphadenopathy:    He has no cervical adenopathy.  Skin: No rash noted. He is not diaphoretic. No erythema.  Vitals reviewed.         Assessment & Plan:  Prediabetes  Recommended to the patient a low carbohydrate diet.  Continue 30 minutes a day of aerobic exercise which she is already doing.  Spent 15 minutes with the patient discussing a low carbohydrate diet and answering his questions.  We will recheck fasting lab work in 6 months to monitor.  Blood pressure today is outstanding.  Cholesterol is well controlled.  LDL cholesterol is well below 70 which is ideal given his advanced age.

## 2017-11-25 ENCOUNTER — Other Ambulatory Visit: Payer: Self-pay | Admitting: Family Medicine

## 2017-12-18 ENCOUNTER — Ambulatory Visit (INDEPENDENT_AMBULATORY_CARE_PROVIDER_SITE_OTHER): Payer: Medicare Other | Admitting: Family Medicine

## 2017-12-18 ENCOUNTER — Encounter: Payer: Self-pay | Admitting: Family Medicine

## 2017-12-18 VITALS — BP 122/72 | HR 66 | Temp 97.9°F | Resp 14 | Ht 74.0 in | Wt 204.0 lb

## 2017-12-18 DIAGNOSIS — S80862A Insect bite (nonvenomous), left lower leg, initial encounter: Secondary | ICD-10-CM | POA: Diagnosis not present

## 2017-12-18 DIAGNOSIS — W57XXXA Bitten or stung by nonvenomous insect and other nonvenomous arthropods, initial encounter: Secondary | ICD-10-CM

## 2017-12-18 MED ORDER — DOXYCYCLINE HYCLATE 100 MG PO TABS: 100.0000 mg | ORAL_TABLET | Freq: Two times a day (BID) | ORAL | 0 refills | Status: DC

## 2017-12-18 NOTE — Progress Notes (Signed)
`   Subjective:    Patient ID: Jesse Fletcher., male    DOB: 1935-04-29, 83 y.o.   MRN: [id number]  HPI Patient felt an itchy spot just below his left gluteus on the superior portion of his upper left hamstring yesterday.  He felt back there and there was a swollen area with what felt to be a little black spot however it was difficult for him to see it.  On examination today, the patient has a [place] tick feeding on top of a erythematous indurated patch of skin roughly the diameter of a quarter just below his left gluteus on his upper left thigh/hamstring.  The tick was removed easily with a pair of forceps with no residual debris remaining.  Patient denies any flulike symptoms.  He denies any headache, neck stiffness, joint pains.  He denies any nausea or vomiting.  There is no evidence of erythema migrans at the present time Past Medical History:  Diagnosis Date  . Blood in stool   . Hemorrhoids   . Hyperlipidemia   . Hypertension   . Rectal fissure   . Rectal pain    Past Surgical History:  Procedure Laterality Date  . HERNIA REPAIR  2000 (approx)  . lower back surgery  05/1995, 05/1998  . SHOULDER SURGERY  1997   both   Current Outpatient Medications on File Prior to Visit  Medication Sig Dispense Refill  . aspirin 81 MG tablet Take 81 mg by mouth daily.      Marland Kitchen lisinopril-hydrochlorothiazide (PRINZIDE,ZESTORETIC) 10-12.5 MG tablet TAKE 1 TABLET BY MOUTH TWICE DAILY 180 tablet 2  . metoprolol tartrate (LOPRESSOR) 25 MG tablet TAKE ONE TABLET BY MOUTH TWICE DAILY 180 tablet 4  . Multiple Vitamins-Minerals (CENTRUM SILVER ADULT 50+ PO) Take 1 tablet by mouth daily.    . simvastatin (ZOCOR) 20 MG tablet TAKE 1 TABLET BY MOUTH AT BEDTIME 90 tablet 1   No current facility-administered medications on file prior to visit.    No Known Allergies Social History   Socioeconomic History  . Marital status: Widowed    Spouse name: Not on file  . Number of children: Not on file  .  Years of education: Not on file  . Highest education level: Not on file  Occupational History  . Not on file  Social Needs  . Financial resource strain: Not on file  . Food insecurity:    Worry: Not on file    Inability: Not on file  . Transportation needs:    Medical: Not on file    Non-medical: Not on file  Tobacco Use  . Smoking status: Never Smoker  . Smokeless tobacco: Never Used  Substance and Sexual Activity  . Alcohol use: No  . Drug use: No  . Sexual activity: Not on file  Lifestyle  . Physical activity:    Days per week: Not on file    Minutes per session: Not on file  . Stress: Not on file  Relationships  . Social connections:    Talks on phone: Not on file    Gets together: Not on file    Attends religious service: Not on file    Active member of club or organization: Not on file    Attends meetings of clubs or organizations: Not on file    Relationship status: Not on file  . Intimate partner violence:    Fear of current or ex partner: Not on file    Emotionally abused: Not  on file    Physically abused: Not on file    Forced sexual activity: Not on file  Other Topics Concern  . Not on file  Social History Narrative  . Not on file     Review of Systems  All other systems reviewed and are negative.      Objective:   Physical Exam  Constitutional: He appears well-developed and well-nourished. No distress.  HENT:  Head: Normocephalic.  Right Ear: External ear normal.  Left Ear: External ear normal.  Nose: Nose normal.  Mouth/Throat: Oropharynx is clear and moist. No oropharyngeal exudate.  Eyes: Pupils are equal, round, and reactive to light. Conjunctivae and EOM are normal.  Neck: Neck supple.  Cardiovascular: Normal rate, regular rhythm and normal heart sounds.  Pulmonary/Chest: Effort normal and breath sounds normal. No respiratory distress. He has no wheezes. He has no rales.  Lymphadenopathy:    He has no cervical adenopathy.  Skin: He is  not diaphoretic. There is erythema.     Vitals reviewed.         Assessment & Plan:  Tick bite with subsequent removal of tick  Tick was removed easily with a pair of forceps.  No treatment is required at the present time.  I did give the patient a prescription for doxycycline.  I explained to the patient if he develops flulike symptoms including headache, neck stiffness, polyarthralgias/joint pain, muscle pains, low-grade fever, I want him to start doxycycline for possible [facility] spotted fever and to call me immediately.  Also explained to the patient that he needs to watch that area daily.  If he sees a spreading red ring or a spreading patch of erythema from the original site of the bite which is roughly the diameter of a quarter right now, I want him to start doxycycline for possible Lyme disease and to let me know.  Otherwise if he develops no symptoms no further treatment is necessary.

## 2018-01-09 ENCOUNTER — Telehealth: Payer: Self-pay | Admitting: Family Medicine

## 2018-01-09 ENCOUNTER — Other Ambulatory Visit: Payer: Self-pay | Admitting: Family Medicine

## 2018-01-09 MED ORDER — SIMVASTATIN 20 MG PO TABS: 20.0000 mg | ORAL_TABLET | Freq: Every day | ORAL | 1 refills | Status: DC

## 2018-01-09 NOTE — Telephone Encounter (Signed)
Medication called/sent to requested pharmacy  

## 2018-01-09 NOTE — Telephone Encounter (Signed)
[phone] Patient is calling to get refill on his simvastatin  Sent to [place]

## 2018-01-20 ENCOUNTER — Ambulatory Visit (INDEPENDENT_AMBULATORY_CARE_PROVIDER_SITE_OTHER): Payer: Medicare Other | Admitting: Family Medicine

## 2018-01-20 ENCOUNTER — Encounter: Payer: Self-pay | Admitting: Family Medicine

## 2018-01-20 VITALS — BP 110/68 | HR 73 | Temp 97.7°F | Resp 16 | Ht 74.0 in | Wt 205.0 lb

## 2018-01-20 DIAGNOSIS — R911 Solitary pulmonary nodule: Secondary | ICD-10-CM | POA: Diagnosis not present

## 2018-01-20 NOTE — Progress Notes (Signed)
Subjective:    Patient ID: Jesse Fletcher., male    DOB: 09-Apr-1935, 83 y.o.   MRN: [id number]  HPI Patient went to the hospital in December for chest pain.  During his work-up, he had a chest x-ray which revealed:  IMPRESSION: Mild bronchitic changes without infiltrate.  Question LEFT nipple shadow; repeat PA chest radiograph with nipple markers recommended to exclude pulmonary nodule.  Patient has been reviewing his chart online and is concerned about the possible pulmonary nodule seen on the chest x-ray.  He would like to proceed with further work-up to rule out an underlying nodule.   Past Medical History:  Diagnosis Date  . Blood in stool   . Hemorrhoids   . Hyperlipidemia   . Hypertension   . Rectal fissure   . Rectal pain    Past Surgical History:  Procedure Laterality Date  . HERNIA REPAIR  2000 (approx)  . lower back surgery  05/1995, 05/1998  . SHOULDER SURGERY  1997   both   Current Outpatient Medications on File Prior to Visit  Medication Sig Dispense Refill  . aspirin 81 MG tablet Take 81 mg by mouth daily.      Marland Kitchen lisinopril-hydrochlorothiazide (PRINZIDE,ZESTORETIC) 10-12.5 MG tablet TAKE 1 TABLET BY MOUTH TWICE DAILY 180 tablet 2  . metoprolol tartrate (LOPRESSOR) 25 MG tablet TAKE ONE TABLET BY MOUTH TWICE DAILY 180 tablet 4  . Multiple Vitamins-Minerals (CENTRUM SILVER ADULT 50+ PO) Take 1 tablet by mouth daily.    . simvastatin (ZOCOR) 20 MG tablet Take 1 tablet (20 mg total) by mouth at bedtime. 90 tablet 1   No current facility-administered medications on file prior to visit.    No Known Allergies Social History   Socioeconomic History  . Marital status: Widowed    Spouse name: Not on file  . Number of children: Not on file  . Years of education: Not on file  . Highest education level: Not on file  Occupational History  . Not on file  Social Needs  . Financial resource strain: Not on file  . Food insecurity:    Worry: Not on file   Inability: Not on file  . Transportation needs:    Medical: Not on file    Non-medical: Not on file  Tobacco Use  . Smoking status: Never Smoker  . Smokeless tobacco: Never Used  Substance and Sexual Activity  . Alcohol use: No  . Drug use: No  . Sexual activity: Not on file  Lifestyle  . Physical activity:    Days per week: Not on file    Minutes per session: Not on file  . Stress: Not on file  Relationships  . Social connections:    Talks on phone: Not on file    Gets together: Not on file    Attends religious service: Not on file    Active member of club or organization: Not on file    Attends meetings of clubs or organizations: Not on file    Relationship status: Not on file  . Intimate partner violence:    Fear of current or ex partner: Not on file    Emotionally abused: Not on file    Physically abused: Not on file    Forced sexual activity: Not on file  Other Topics Concern  . Not on file  Social History Narrative  . Not on file      Review of Systems  All other systems reviewed and are negative.  Objective:   Physical Exam  Cardiovascular: Normal rate, regular rhythm and normal heart sounds.  Pulmonary/Chest: Effort normal and breath sounds normal.  Vitals reviewed.         Assessment & Plan:  Pulmonary nodule - Plan: DG Chest 2 View  I explained that it is possible that this could represent a shadow of his nipple area however we could obtain an x-ray with nipple markers to rule out an underlying pulmonary nodule.  Patient would feel more comfortable with this.  Therefore I will order the x-ray and requested nipple markers.

## 2018-01-23 ENCOUNTER — Ambulatory Visit
Admission: RE | Admit: 2018-01-23 | Discharge: 2018-01-23 | Disposition: A | Discharge status: Home / Self Care | Payer: Medicare Other | Source: Ambulatory Visit | Attending: Family Medicine | Admitting: Family Medicine

## 2018-01-23 DIAGNOSIS — I712 Thoracic aortic aneurysm, without rupture: Secondary | ICD-10-CM | POA: Diagnosis not present

## 2018-01-23 DIAGNOSIS — R911 Solitary pulmonary nodule: Secondary | ICD-10-CM

## 2018-03-26 ENCOUNTER — Encounter: Payer: Self-pay | Admitting: Family Medicine

## 2018-03-26 ENCOUNTER — Other Ambulatory Visit: Payer: Self-pay

## 2018-03-26 ENCOUNTER — Ambulatory Visit (INDEPENDENT_AMBULATORY_CARE_PROVIDER_SITE_OTHER): Payer: Medicare Other | Admitting: Family Medicine

## 2018-03-26 ENCOUNTER — Other Ambulatory Visit: Payer: Self-pay | Admitting: Family Medicine

## 2018-03-26 VITALS — BP 130/68 | HR 80 | Temp 100.9°F | Resp 16 | Ht 74.0 in | Wt 210.0 lb

## 2018-03-26 DIAGNOSIS — R110 Nausea: Secondary | ICD-10-CM

## 2018-03-26 DIAGNOSIS — J0140 Acute pansinusitis, unspecified: Secondary | ICD-10-CM

## 2018-03-26 DIAGNOSIS — R509 Fever, unspecified: Secondary | ICD-10-CM | POA: Diagnosis not present

## 2018-03-26 DIAGNOSIS — R300 Dysuria: Secondary | ICD-10-CM | POA: Diagnosis not present

## 2018-03-26 DIAGNOSIS — J069 Acute upper respiratory infection, unspecified: Secondary | ICD-10-CM

## 2018-03-26 LAB — URINALYSIS, ROUTINE W REFLEX MICROSCOPIC
BILIRUBIN URINE: NEGATIVE
GLUCOSE, UA: NEGATIVE
Hgb urine dipstick: NEGATIVE
Leukocytes, UA: NEGATIVE
Nitrite: NEGATIVE
SPECIFIC GRAVITY, URINE: 1.02 (ref 1.001–1.03)
pH: 6.5 (ref 5.0–8.0)

## 2018-03-26 LAB — INFLUENZA A AND B AG, IMMUNOASSAY
INFLUENZA A ANTIGEN: NOT DETECTED
INFLUENZA B ANTIGEN: NOT DETECTED

## 2018-03-26 LAB — MICROSCOPIC MESSAGE

## 2018-03-26 MED ORDER — DOXYCYCLINE HYCLATE 100 MG PO TABS: 100.0000 mg | ORAL_TABLET | Freq: Two times a day (BID) | ORAL | 0 refills | Status: AC

## 2018-03-26 NOTE — Patient Instructions (Signed)
Hold the Doxycycline antibiotics - I would only start taking if you have sudden worsening of your sinuses that are tender to the touch.    Right now I think its likely a viral infection.  If your urinary symptoms worsen - contact urology or start your standing antibiotics for that.    Viral Respiratory Infection A viral respiratory infection is an illness that affects parts of the body used for breathing, like the lungs, nose, and throat. It is caused by a germ called a virus. Some examples of this kind of infection are:  A cold.  The flu (influenza).  A respiratory syncytial virus (RSV) infection.  How do I know if I have this infection? Most of the time this infection causes:  A stuffy or runny nose.  Yellow or green fluid in the nose.  A cough.  Sneezing.  Tiredness (fatigue).  Achy muscles.  A sore throat.  Sweating or chills.  A fever.  A headache.  How is this infection treated? If the flu is diagnosed early, it may be treated with an antiviral medicine. This medicine shortens the length of time a person has symptoms. Symptoms may be treated with over-the-counter and prescription medicines, such as:  Expectorants. These make it easier to cough up mucus.  Decongestant nasal sprays.  Doctors do not prescribe antibiotic medicines for viral infections. They do not work with this kind of infection. How do I know if I should stay home? To keep others from getting sick, stay home if you have:  A fever.  A lasting cough.  A sore throat.  A runny nose.  Sneezing.  Muscles aches.  Headaches.  Tiredness.  Weakness.  Chills.  Sweating.  An upset stomach (nausea).  Follow these instructions at home:  Rest as much as possible.  Take over-the-counter and prescription medicines only as told by your doctor.  Drink enough fluid to keep your pee (urine) clear or pale yellow.  Gargle with salt water. Do this 3-4 times per day or as needed. To make  a salt-water mixture, dissolve -1 tsp of salt in 1 cup of warm water. Make sure the salt dissolves all the way.  Use nose drops made from salt water. This helps with stuffiness (congestion). It also helps soften the skin around your nose.  Do not drink alcohol.  Do not use tobacco products, including cigarettes, chewing tobacco, and e-cigarettes. If you need help quitting, ask your doctor. Get help if:  Your symptoms last for 10 days or longer.  Your symptoms get worse over time.  You have a fever.  You have very bad pain in your face or forehead.  Parts of your jaw or neck become very swollen. Get help right away if:  You feel pain or pressure in your chest.  You have shortness of breath.  You faint or feel like you will faint.  You keep throwing up (vomiting).  You feel confused. This information is not intended to replace advice given to you by your health care provider. Make sure you discuss any questions you have with your health care provider. Document Released: 05/31/2008 Document Revised: 11/24/2015 Document Reviewed: 11/24/2014 Elsevier Interactive Patient Education  2018 [employer].

## 2018-03-26 NOTE — Progress Notes (Signed)
Patient ID: Jesse Doyne., male    DOB: December 16, 1934, 83 y.o.   MRN: [id number]  PCP: Susy Frizzle, MD  Chief Complaint  Patient presents with  . Illness    x3 days- fever, chills, fatigue, muscle aches, head congestion, nonproductive cough    Subjective:   Jesse Bundren. is a 83 y.o. male, presents to clinic with CC of fever, body aches and decreased appetite for 3 days.  Sx began after a trip to the beach.   He does have nasal congestion and HA.  Notes his HR is faster than normal, this does occur when he has fever.Marland Kitchen  He reports dry cough that is "normal" for him.  He denies weakness, near-syncope, chest pain, shortness of breath, back pain, neck stiffness, rash, abd pain, diarrhea, vomiting, back pain.  He has hx of BPH and does have some dysuria, but states is common for him and he denies hematuria, frequency, flank pain.    Patient Active Problem List   Diagnosis Date Noted  . Chest pain 06/17/2017  . Dyspnea on exertion 06/17/2017  . Rectal bleeding 11/25/2013  . Hyperlipidemia   . Hypertension   . Hemorrhoids 05/21/2011  . Anal fissure 05/21/2011     Prior to Admission medications   Medication Sig Start Date End Date Taking? Authorizing Provider  aspirin 81 MG tablet Take 81 mg by mouth daily.     Yes [provider]  lisinopril-hydrochlorothiazide (PRINZIDE,ZESTORETIC) 10-12.5 MG tablet TAKE 1 TABLET BY MOUTH TWICE DAILY 11/26/17  Yes Susy Frizzle, MD  metoprolol tartrate (LOPRESSOR) 25 MG tablet TAKE ONE TABLET BY MOUTH TWICE DAILY 01/11/17  Yes Susy Frizzle, MD  Multiple Vitamins-Minerals (CENTRUM SILVER ADULT 50+ PO) Take 1 tablet by mouth daily.   Yes [provider]  simvastatin (ZOCOR) 20 MG tablet Take 1 tablet (20 mg total) by mouth at bedtime. 01/09/18  Yes Susy Frizzle, MD     No Known Allergies   History reviewed. No pertinent family history.   Social History   Socioeconomic History  . Marital status:  Widowed    Spouse name: Not on file  . Number of children: Not on file  . Years of education: Not on file  . Highest education level: Not on file  Occupational History  . Not on file  Social Needs  . Financial resource strain: Not on file  . Food insecurity:    Worry: Not on file    Inability: Not on file  . Transportation needs:    Medical: Not on file    Non-medical: Not on file  Tobacco Use  . Smoking status: Never Smoker  . Smokeless tobacco: Never Used  Substance and Sexual Activity  . Alcohol use: No  . Drug use: No  . Sexual activity: Not on file  Lifestyle  . Physical activity:    Days per week: Not on file    Minutes per session: Not on file  . Stress: Not on file  Relationships  . Social connections:    Talks on phone: Not on file    Gets together: Not on file    Attends religious service: Not on file    Active member of club or organization: Not on file    Attends meetings of clubs or organizations: Not on file    Relationship status: Not on file  . Intimate partner violence:    Fear of current or ex partner: Not on file  Emotionally abused: Not on file    Physically abused: Not on file    Forced sexual activity: Not on file  Other Topics Concern  . Not on file  Social History Narrative  . Not on file     Review of Systems  Constitutional: Positive for appetite change, chills and fever. Negative for activity change, diaphoresis, fatigue and unexpected weight change.  HENT: Negative.   Eyes: Negative.   Respiratory: Negative.   Cardiovascular: Negative.   Gastrointestinal: Negative.   Endocrine: Negative.   Genitourinary: Negative.   Musculoskeletal: Negative.   Skin: Negative.   Allergic/Immunologic: Negative.   Neurological: Negative.   Hematological: Negative.   Psychiatric/Behavioral: Negative.   10 Systems reviewed and are negative for acute change except as noted in the HPI.      Objective:    Vitals:   03/26/18 1419  BP: 130/68   Pulse: 80  Resp: 16  Temp: (!) 100.9 F (38.3 C)  TempSrc: Oral  SpO2: 98%  Weight: 210 lb (95.3 kg)  Height: 6\' 2"  (1.88 m)      Physical Exam  Constitutional: He is oriented to person, place, and time. He appears well-developed and well-nourished.  Non-toxic appearance. He does not appear ill. No distress.  Alert, talkative, well-appearing elderly male, appears stated age, non-toxic appearing  HENT:  Head: Normocephalic and atraumatic.  Right Ear: Tympanic membrane, external ear and ear canal normal.  Left Ear: Tympanic membrane, external ear and ear canal normal.  Nose: Mucosal edema and rhinorrhea present. Right sinus exhibits no maxillary sinus tenderness and no frontal sinus tenderness. Left sinus exhibits no maxillary sinus tenderness and no frontal sinus tenderness.  Mouth/Throat: Uvula is midline, oropharynx is clear and moist and mucous membranes are normal. Mucous membranes are not pale, not dry and not cyanotic. No trismus in the jaw. No uvula swelling. No oropharyngeal exudate, posterior oropharyngeal edema or posterior oropharyngeal erythema.  Eyes: Pupils are equal, round, and reactive to light. Conjunctivae, EOM and lids are normal.  Neck: Trachea normal, normal range of motion and phonation normal. Neck supple. No tracheal deviation present.  Cardiovascular: Regular rhythm, normal heart sounds and normal pulses. Exam reveals no gallop and no friction rub.  No murmur heard. Pulses:      Radial pulses are 2+ on the right side, and 2+ on the left side.       Posterior tibial pulses are 2+ on the right side, and 2+ on the left side.  Pulmonary/Chest: Effort normal and breath sounds normal. He has no wheezes. He has no rhonchi. He has no rales.  Abdominal: Soft. Normal appearance and bowel sounds are normal. He exhibits no distension. There is no tenderness. There is no rebound and no guarding.  Musculoskeletal: Normal range of motion. He exhibits no edema.  Neurological:  He is alert and oriented to person, place, and time. Gait normal.  Skin: Skin is warm, dry and intact. Capillary refill takes less than 2 seconds. No rash noted. He is not diaphoretic.  Psychiatric: He has a normal mood and affect. His speech is normal and behavior is normal.  Nursing note and vitals reviewed.     Results for orders placed or performed in visit on 03/26/18  Urine Culture  Result Value Ref Range   MICRO NUMBER: [id number]    SPECIMEN QUALITY: ADEQUATE    Sample Source NOT GIVEN    STATUS: FINAL    Result: No Growth   Influenza A and B Ag, Immunoassay  Result Value Ref Range   Source: NASOPHARYNX    INFLUENZA A ANTIGEN NOT DETECTED NOT DETECT   INFLUENZA B ANTIGEN NOT DETECTED NOT DETECT   COMMENT:    Urinalysis, Routine w reflex microscopic  Result Value Ref Range   Color, Urine YELLOW YELLOW   APPearance CLEAR CLEAR   Specific Gravity, Urine 1.020 1.001 - 1.03   pH 6.5 5.0 - 8.0   Glucose, UA NEGATIVE NEGATIVE   Bilirubin Urine NEGATIVE NEGATIVE   Ketones, ur 1+ (A) NEGATIVE   Hgb urine dipstick NEGATIVE NEGATIVE   Protein, ur 2+ (A) NEGATIVE   Nitrite NEGATIVE NEGATIVE   Leukocytes, UA NEGATIVE NEGATIVE   WBC, UA CANCELED   Microscopic Message  Result Value Ref Range   Note           Assessment & Plan:      ICD-10-CM   1. Acute non-recurrent pansinusitis J01.40   2. Fever and chills R50.9 Influenza A and B Ag, Immunoassay    Urinalysis, Routine w reflex microscopic    Urine Culture  3. Nausea R11.0   4. Dysuria R30.0 Urine Culture    Microscopic Message  5. Upper respiratory tract infection, unspecified type J06.9     Suspect sinusitis/URI, likely viral, no sinus TTP. Lungs clear, no abd ttp or flank pain. He has some dysuria, states it is normal for him, UA is negative for leukocytes or nitrites, some protein and ketones which is consistent with decreased appetite.  Urine culture added and pending Flu negative He is febrile here, encouraged  him to treat fever, keep hydrated  Did give him doxycycline to hold and only use if he develops severe sinus pain and pressure with his fever.  Encourage patient to follow-up closely if symptoms do not improve or if he feels that he is getting dehydrated we get all, close follow-up due to age  Delsa Grana, Hershal Coria 03/26/18 3:08 PM

## 2018-03-27 LAB — URINE CULTURE
MICRO NUMBER: 91152898
Result:: NO GROWTH
SPECIMEN QUALITY: ADEQUATE

## 2018-05-01 ENCOUNTER — Ambulatory Visit (INDEPENDENT_AMBULATORY_CARE_PROVIDER_SITE_OTHER): Payer: Medicare Other

## 2018-05-01 DIAGNOSIS — Z23 Encounter for immunization: Secondary | ICD-10-CM | POA: Diagnosis not present

## 2018-05-01 NOTE — Progress Notes (Signed)
Patient was in office for flu vaccine.Patient received flu vaccine in his left deltoid patient tolerated well

## 2018-08-05 DIAGNOSIS — L570 Actinic keratosis: Secondary | ICD-10-CM | POA: Diagnosis not present

## 2018-08-05 DIAGNOSIS — L578 Other skin changes due to chronic exposure to nonionizing radiation: Secondary | ICD-10-CM | POA: Diagnosis not present

## 2018-08-05 DIAGNOSIS — Z85828 Personal history of other malignant neoplasm of skin: Secondary | ICD-10-CM | POA: Diagnosis not present

## 2018-08-11 DIAGNOSIS — N411 Chronic prostatitis: Secondary | ICD-10-CM | POA: Diagnosis not present

## 2018-08-11 DIAGNOSIS — Z87442 Personal history of urinary calculi: Secondary | ICD-10-CM | POA: Diagnosis not present

## 2018-08-11 DIAGNOSIS — R351 Nocturia: Secondary | ICD-10-CM | POA: Diagnosis not present

## 2018-08-11 DIAGNOSIS — N401 Enlarged prostate with lower urinary tract symptoms: Secondary | ICD-10-CM | POA: Diagnosis not present

## 2018-08-16 IMAGING — CT CT HEART SCORING
2 series · 16 of 20 positions shown, 18 images · non-contrast
Comparison: None.

CLINICAL DATA: Risk stratification

EXAM:
Coronary Calcium Score
TECHNIQUE: The patient was scanned on a Siemens Somatom 64 slice scanner. Axial
non-contrast 3 mm slices were carried out through the heart. The
data set was analyzed on a dedicated work station and scored using
the Agatson method.

[Series 2: casc 3.0 i36f 2 bestdiast 72 % · axial · 0.36mm/px · z∈[-325,-199]mm · 8 of 56 slices shown, 10 images]
[im 7/56  vessel]
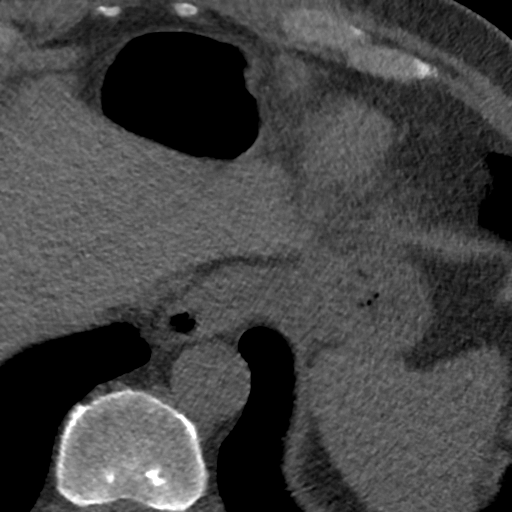
[im 7/56  lung]
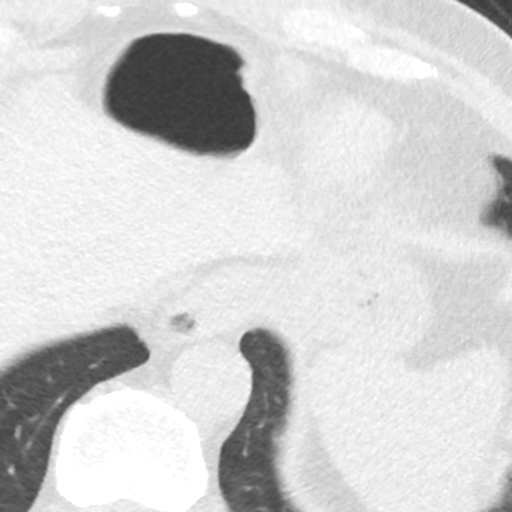
[im 13/56  vessel]
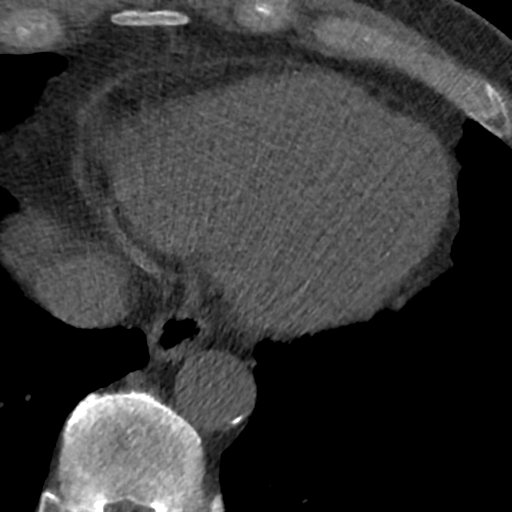
[im 19/56  vessel]
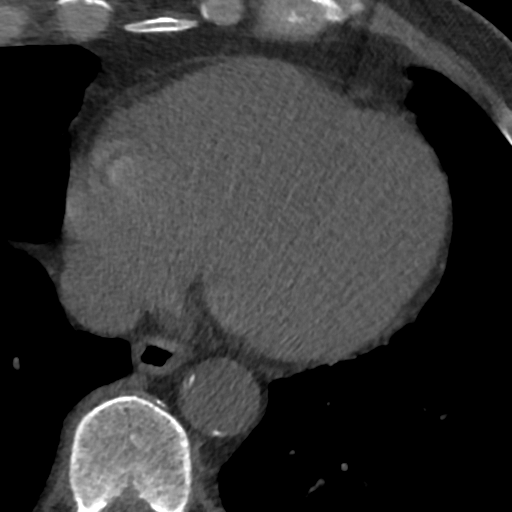
[im 25/56  vessel]
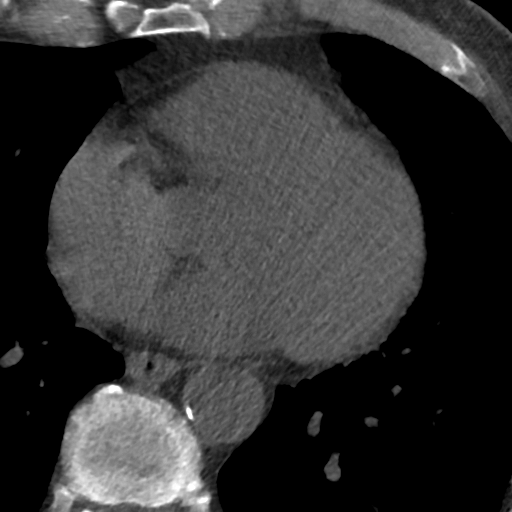
[im 31/56  vessel]
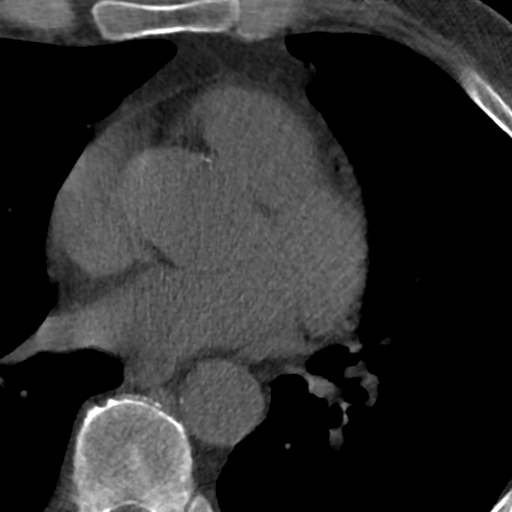
[im 31/56  lung]
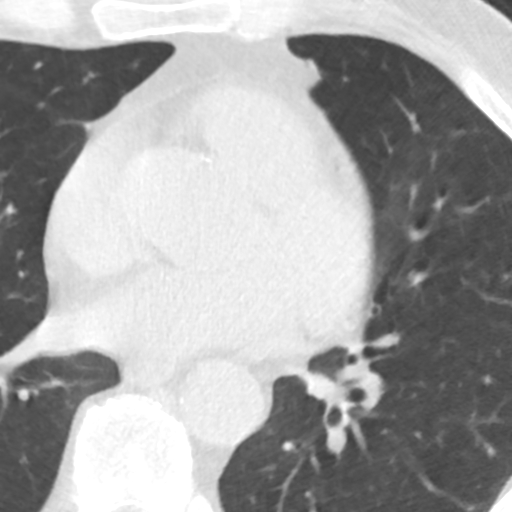
[im 37/56  vessel]
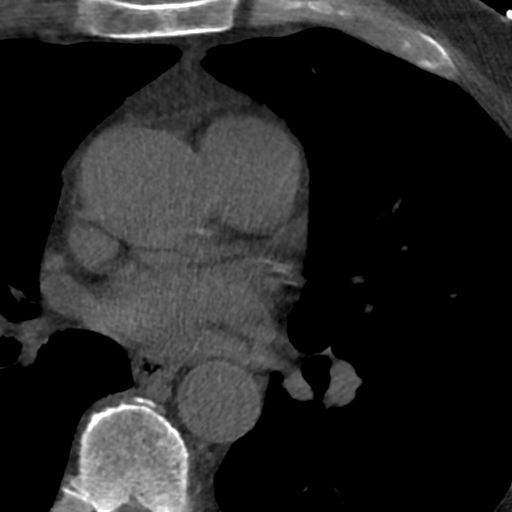
[im 43/56  vessel]
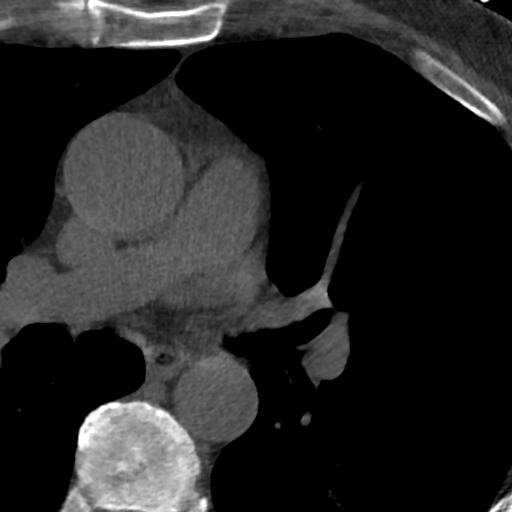
[im 49/56  vessel]
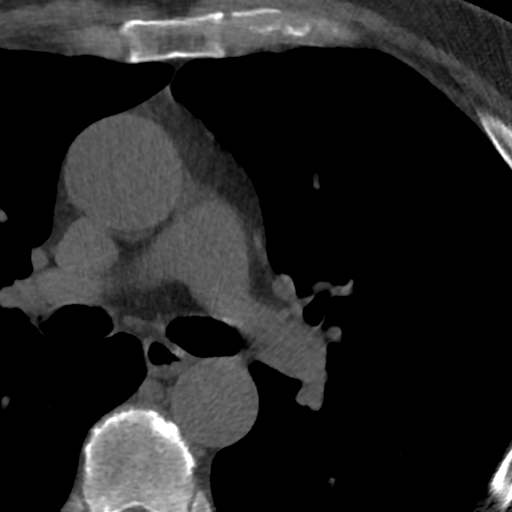

[Series 4: lung st 73 % · axial · 0.77mm/px · z∈[-322,-196]mm · 8 of 55 slices shown]
[im 7/55  lung]
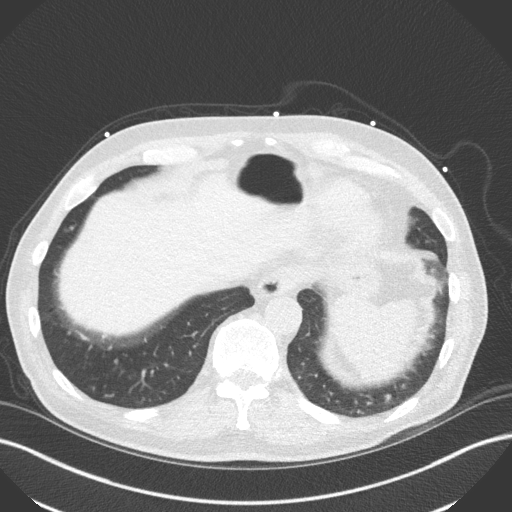
[im 13/55  lung]
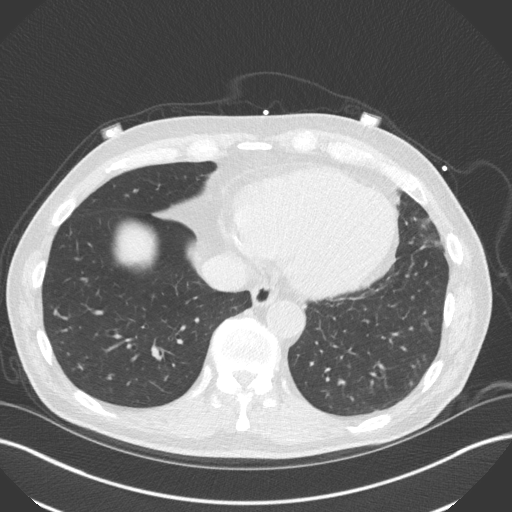
[im 19/55  lung]
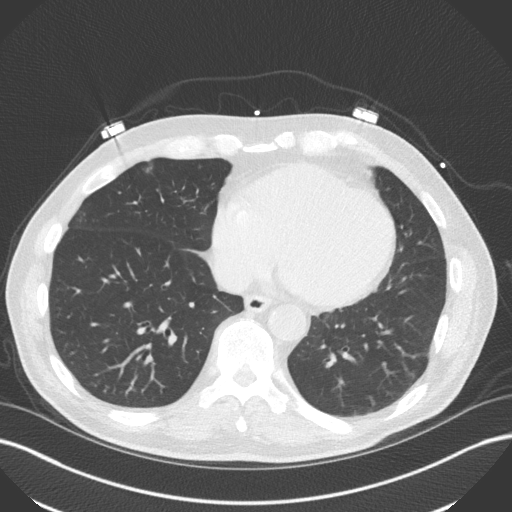
[im 25/55  lung]
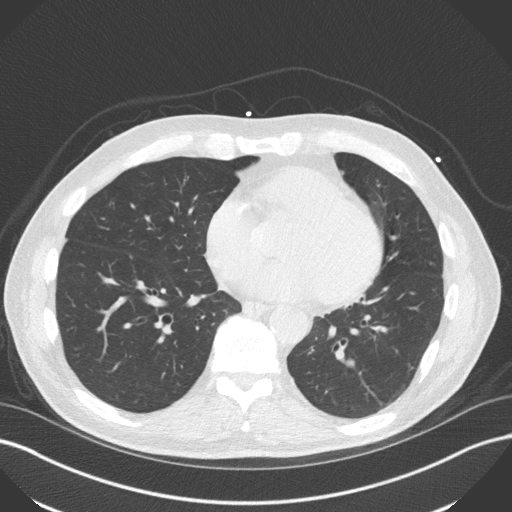
[im 31/55  lung]
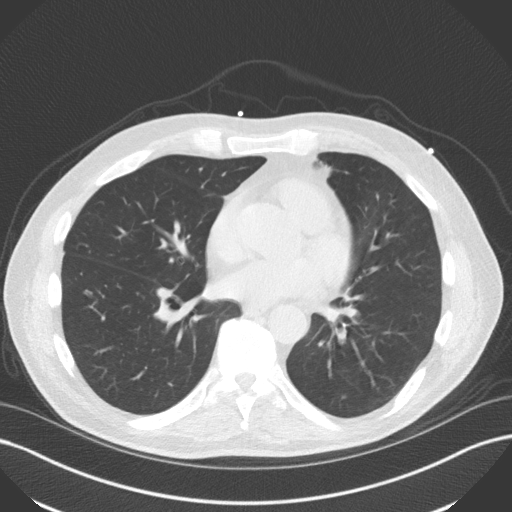
[im 37/55  lung]
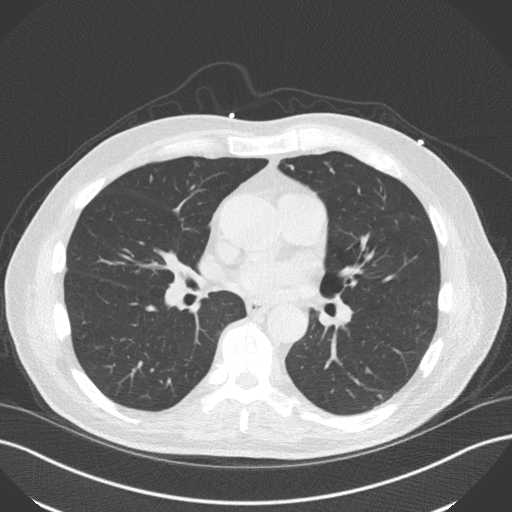
[im 43/55  lung]
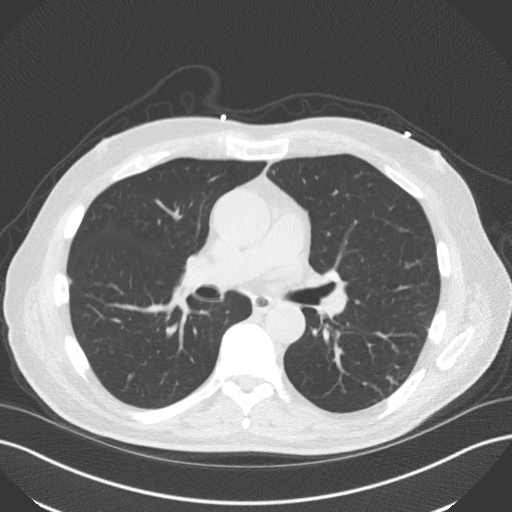
[im 49/55  lung]
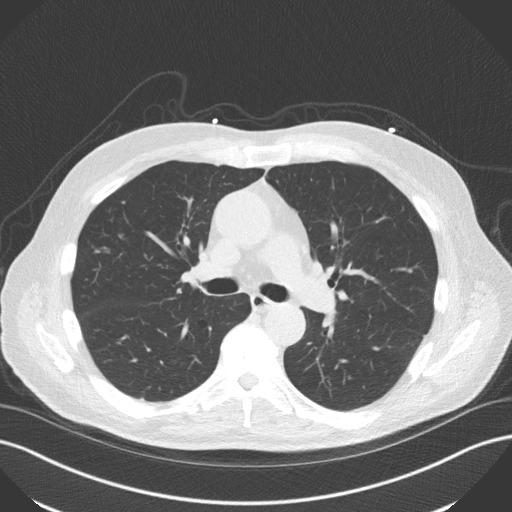

[16 of 20 positions shown; findings below may reference images not displayed]

FINDINGS: Non-cardiac: See separate report from [REDACTED].

Ascending Aorta: Ascending aortic root dilatation 4.0 cm with
atherosclerosis

Pericardium: Normal

Coronary arteries: 3 vessel coronary artery calcium noted
IMPRESSION: Coronary calcium score of 209 . This was 37 th percentile for age
and sex matched control.

Jp Ohare

EXAM:
OVER-READ INTERPRETATION  CT CHEST

The following report is an over-read performed by radiologist Dr.
Kwatlie Kupret [REDACTED] on 07/17/2017. This
over-read does not include interpretation of cardiac or coronary
anatomy or pathology. The coronary calcium score/coronary CTA
interpretation by the cardiologist is attached.
FINDINGS: Aortic atherosclerosis. Scattered throughout the visualized portions
of the lungs there are areas of thickening of the
peribronchovascular interstitium, peripheral bronchiolectasis and
extensive peribronchovascular micronodularity, most compatible with
areas of mucoid impaction within terminal bronchioles. Within the
visualized portions of the thorax there are no larger more
suspicious appearing pulmonary nodules or masses, there is no acute
consolidative airspace disease, no pleural effusions, no
pneumothorax and no lymphadenopathy. Visualized portions of the
upper abdomen are unremarkable. There are no aggressive appearing
lytic or blastic lesions noted in the visualized portions of the
skeleton.
IMPRESSION: 1.  Aortic Atherosclerosis (9AVO3-K77.7).
2. Spectrum of findings in the visualized lungs concerning for a
chronic indolent atypical infectious process such is MARE
(mycobacterium avium intracellulare). Outpatient referral to
Pulmonology for further evaluation is suggested.

## 2018-10-07 ENCOUNTER — Other Ambulatory Visit: Payer: Self-pay | Admitting: Family Medicine

## 2018-10-24 ENCOUNTER — Telehealth: Payer: Self-pay

## 2018-10-24 NOTE — Telephone Encounter (Signed)
Called patient from recall list.  No answer. LMOV.  This is the 3rd attempt per recall list.  Will delete recall.   

## 2018-10-27 ENCOUNTER — Ambulatory Visit (INDEPENDENT_AMBULATORY_CARE_PROVIDER_SITE_OTHER): Payer: Medicare Other | Admitting: Family Medicine

## 2018-10-27 ENCOUNTER — Other Ambulatory Visit: Payer: Self-pay

## 2018-10-27 ENCOUNTER — Encounter: Payer: Self-pay | Admitting: Family Medicine

## 2018-10-27 VITALS — BP 108/72 | HR 62 | Temp 98.5°F | Resp 12 | Ht 74.0 in | Wt 209.0 lb

## 2018-10-27 DIAGNOSIS — N644 Mastodynia: Secondary | ICD-10-CM

## 2018-10-27 MED ORDER — DICLOFENAC SODIUM 1 % TD GEL: 2.0000 g | Freq: Four times a day (QID) | TRANSDERMAL | 1 refills | Status: DC

## 2018-10-27 NOTE — Progress Notes (Signed)
Subjective:    Patient ID: Jesse Fletcher., male    DOB: December 02, 1934, 84 y.o.   MRN: [id number]  HPI 12/2017 Patient went to the hospital in December for chest pain.  During his work-up, he had a chest x-ray which revealed:  IMPRESSION: Mild bronchitic changes without infiltrate.  Question LEFT nipple shadow; repeat PA chest radiograph with nipple markers recommended to exclude pulmonary nodule.  Patient has been reviewing his chart online and is concerned about the possible pulmonary nodule seen on the chest x-ray.  He would like to proceed with further work-up to rule out an underlying nodule.  At that time, my plan was: I explained that it is possible that this could represent a shadow of his nipple area however we could obtain an x-ray with nipple markers to rule out an underlying pulmonary nodule.  Patient would feel more comfortable with this.  Therefore I will order the x-ray and requested nipple markers.  10/27/18 Patient is following up today because he is concerned about his left nipple.  He states that over the last 2 to 3 weeks, his nipple has become extremely sore to the touch.  He states that it burns and is very sensitive.  It is the nipple and the area around the nipple.  There is no red rash in that area.  There is no evidence of Paget's disease.  There is no palpable abnormality.  There is no warmth.  There is no discharge coming from the nipple.  There is no palpable underlying breast mass or abnormality.  Patient is concerned because this is the area where the "shadow" was seen previously on his x-ray as mentioned above and now the nipple is extremely sore to the touch.  He is concerned about possible breast cancer Past Medical History:  Diagnosis Date  . Blood in stool   . Hemorrhoids   . Hyperlipidemia   . Hypertension   . Rectal fissure   . Rectal pain    Past Surgical History:  Procedure Laterality Date  . HERNIA REPAIR  2000 (approx)  . lower back surgery   05/1995, 05/1998  . SHOULDER SURGERY  1997   both   Current Outpatient Medications on File Prior to Visit  Medication Sig Dispense Refill  . aspirin 81 MG tablet Take 81 mg by mouth daily.      Marland Kitchen lisinopril-hydrochlorothiazide (PRINZIDE,ZESTORETIC) 10-12.5 MG tablet Take 1 tablet by mouth twice daily 180 tablet 1  . metoprolol tartrate (LOPRESSOR) 25 MG tablet TAKE 1 TABLET BY MOUTH TWICE DAILY 180 tablet 4  . Multiple Vitamins-Minerals (CENTRUM SILVER ADULT 50+ PO) Take 1 tablet by mouth daily.    . simvastatin (ZOCOR) 20 MG tablet Take 1 tablet (20 mg total) by mouth at bedtime. 90 tablet 1   No current facility-administered medications on file prior to visit.    No Known Allergies Social History   Socioeconomic History  . Marital status: Widowed    Spouse name: Not on file  . Number of children: Not on file  . Years of education: Not on file  . Highest education level: Not on file  Occupational History  . Not on file  Social Needs  . Financial resource strain: Not on file  . Food insecurity:    Worry: Not on file    Inability: Not on file  . Transportation needs:    Medical: Not on file    Non-medical: Not on file  Tobacco Use  . Smoking status:  Never Smoker  . Smokeless tobacco: Never Used  Substance and Sexual Activity  . Alcohol use: No  . Drug use: No  . Sexual activity: Not on file  Lifestyle  . Physical activity:    Days per week: Not on file    Minutes per session: Not on file  . Stress: Not on file  Relationships  . Social connections:    Talks on phone: Not on file    Gets together: Not on file    Attends religious service: Not on file    Active member of club or organization: Not on file    Attends meetings of clubs or organizations: Not on file    Relationship status: Not on file  . Intimate partner violence:    Fear of current or ex partner: Not on file    Emotionally abused: Not on file    Physically abused: Not on file    Forced sexual  activity: Not on file  Other Topics Concern  . Not on file  Social History Narrative  . Not on file      Review of Systems  All other systems reviewed and are negative.      Objective:   Physical Exam Vitals signs reviewed.  Cardiovascular:     Rate and Rhythm: Normal rate and regular rhythm.     Heart sounds: Normal heart sounds.  Pulmonary:     Effort: Pulmonary effort is normal.     Breath sounds: Normal breath sounds.  Chest:     Breasts:        Right: Normal. No inverted nipple, mass, nipple discharge or skin change.        Left: Normal. No inverted nipple, mass, nipple discharge or skin change.             Assessment & Plan:  Left nipple pain.  Exam is normal except for the small SK mentioned above.  I palpate no mass and can see no visible abnormality.  I suspect that the patient has underlying nerve irritation in the nipple causing it to be very sensitive.  I would treat that inflammation with Voltaren gel 2 g 3 times daily topically for the next week or so.  If the pain improves and subsides no further work-up is necessary.  Recheck if pain persists or worsens

## 2018-10-31 DIAGNOSIS — M7062 Trochanteric bursitis, left hip: Secondary | ICD-10-CM | POA: Diagnosis not present

## 2018-11-06 ENCOUNTER — Telehealth (INDEPENDENT_AMBULATORY_CARE_PROVIDER_SITE_OTHER): Payer: Medicare Other | Admitting: Cardiovascular Disease

## 2018-11-06 ENCOUNTER — Other Ambulatory Visit: Payer: Self-pay

## 2018-11-06 DIAGNOSIS — R931 Abnormal findings on diagnostic imaging of heart and coronary circulation: Secondary | ICD-10-CM | POA: Diagnosis not present

## 2018-11-06 DIAGNOSIS — I10 Essential (primary) hypertension: Secondary | ICD-10-CM

## 2018-11-06 DIAGNOSIS — I77810 Thoracic aortic ectasia: Secondary | ICD-10-CM | POA: Diagnosis not present

## 2018-11-06 DIAGNOSIS — E7800 Pure hypercholesterolemia, unspecified: Secondary | ICD-10-CM | POA: Diagnosis not present

## 2018-11-06 DIAGNOSIS — R0609 Other forms of dyspnea: Secondary | ICD-10-CM | POA: Diagnosis not present

## 2018-11-06 DIAGNOSIS — R079 Chest pain, unspecified: Secondary | ICD-10-CM | POA: Diagnosis not present

## 2018-11-06 NOTE — Progress Notes (Signed)
Virtual Visit via Telephone Note   This visit type was conducted due to national recommendations for restrictions regarding the COVID-19 Pandemic (e.g. social distancing) in an effort to limit this patient's exposure and mitigate transmission in our community.  Due to his co-morbid illnesses, this patient is at least at moderate risk for complications without adequate follow up.  This format is felt to be most appropriate for this patient at this time.  The patient did not have access to video technology/had technical difficulties with video requiring transitioning to audio format only (telephone).  All issues noted in this document were discussed and addressed.  No physical exam could be performed with this format.  Please refer to the patient's chart for his  consent to telehealth for Jesse Fletcher.   I connected with  Jesse Doyne. on 11/06/18 by a video enabled telemedicine application and verified that I am speaking with the correct person using two identifiers. I discussed the limitations of evaluation and management by telemedicine. The patient expressed understanding and agreed to proceed.   Evaluation Performed:  Follow-up visit  Date:  11/06/2018   ID:  Jesse Doyne., DOB 10-Oct-1934, MRN [id number]  Patient Location:  [place] [address]   Provider location:   [facility], [place] office  PCP:  Susy Frizzle, MD  Cardiologist:  Patsy Baltimore   Chief Complaint: Joint pain   History of Present Illness:    Jesse Simonis. is a 84 y.o. male who presents via audio/video conferencing for a telehealth visit today.   The patient does not symptoms concerning for COVID-19 infection (fever, chills, cough, or new SHORTNESS OF BREATH).   Patient has a past medical history of Chest pain HTN Hyperlipidemia Who presents for follow up of his chest pain, dilated aorta, coronary calcium on CT scan  CT scan in 07/17/2017  Ascending Aorta: Ascending aortic root dilatation 4.0 cm with atherosclerosis Coronary arteries: 3 vessel coronary artery calcium noted Coronary calcium score of 209 . This was 48 th percentile for age and sex matched control.  Hip pain, had a cortisone shot Doing somewhat better  No CP or SOB on exertion No GERD Walking 45 min every day Stopped after hip pain started  Lab work reviewed with him in detail Total chol 133, LDL 68 HBA1C 5.8 Nonsmoker  Some vertigo in AM, eases off,  Has not tried meclizine  Other past medical history reviewed Prior history of chest discomfort  took some Rolaids without relief   EKG which showed no acute changes,  stat troponin I  negative.  GI cocktail. After that he started feeling better but not completely and eventually ended up going to the emergency room mild elevation of white blood cell count  Prior episodes of SOB moving snow, moving branches Unusually hard activity Based on the symptoms echocardiogram and stress echo was ordered That showed no ischemia  Echo stress Treadmill showing low exercise tolerance No wall motion abnormality concerning for ischemia Mildly dilated aortic root 38 mm   Prior CV studies:   The following studies were reviewed today:  Past Medical History:  Diagnosis Date  . Blood in stool   . Hemorrhoids   . Hyperlipidemia   . Hypertension   . Rectal fissure   . Rectal pain    Past Surgical History:  Procedure Laterality Date  . HERNIA REPAIR  2000 (approx)  . lower back surgery  05/1995, 05/1998  . SHOULDER SURGERY  1997   both     No outpatient medications have been marked as taking for the 11/06/18 encounter (Appointment) with Minna Merritts, MD.     Allergies:   Patient has no known allergies.   Social History   Tobacco Use  . Smoking status: Never Smoker  . Smokeless tobacco: Never Used  Substance Use Topics  . Alcohol use: No  . Drug use: No     Current Outpatient  Medications on File Prior to Visit  Medication Sig Dispense Refill  . aspirin 81 MG tablet Take 81 mg by mouth daily.      . diclofenac sodium (VOLTAREN) 1 % GEL Apply 2 g topically 4 (four) times daily. 100 g 1  . lisinopril-hydrochlorothiazide (PRINZIDE,ZESTORETIC) 10-12.5 MG tablet Take 1 tablet by mouth twice daily 180 tablet 1  . metoprolol tartrate (LOPRESSOR) 25 MG tablet TAKE 1 TABLET BY MOUTH TWICE DAILY 180 tablet 4  . Multiple Vitamins-Minerals (CENTRUM SILVER ADULT 50+ PO) Take 1 tablet by mouth daily.    . simvastatin (ZOCOR) 20 MG tablet Take 1 tablet (20 mg total) by mouth at bedtime. 90 tablet 1   No current facility-administered medications on file prior to visit.      Family Hx: The patient's family history is not on file.  ROS:   Please see the history of present illness.    Review of Systems  Constitutional: Negative.   HENT: Negative.   Respiratory: Negative.   Cardiovascular: Negative.   Gastrointestinal: Negative.   Musculoskeletal: Positive for joint pain.  Neurological: Negative.   Psychiatric/Behavioral: Negative.   All other systems reviewed and are negative.    Labs/Other Tests and Data Reviewed:    Recent Labs: No results found for requested labs within last 8760 hours.   Recent Lipid Panel Lab Results  Component Value Date/Time   CHOL 133 10/17/2017 01:59 PM   TRIG 84 10/17/2017 01:59 PM   HDL 49 10/17/2017 01:59 PM   CHOLHDL 2.7 10/17/2017 01:59 PM   LDLCALC 68 10/17/2017 01:59 PM    Wt Readings from Last 3 Encounters:  10/27/18 209 lb (94.8 kg)  03/26/18 210 lb (95.3 kg)  01/20/18 205 lb (93 kg)     Exam:    Vital Signs: Vital signs may also be detailed in the HPI There were no vitals taken for this visit.  Wt Readings from Last 3 Encounters:  10/27/18 209 lb (94.8 kg)  03/26/18 210 lb (95.3 kg)  01/20/18 205 lb (93 kg)   Temp Readings from Last 3 Encounters:  10/27/18 98.5 F (36.9 C) (Oral)  03/26/18 (!) 100.9 F (38.3  C) (Oral)  01/20/18 97.7 F (36.5 C) (Oral)   BP Readings from Last 3 Encounters:  10/27/18 108/72  03/26/18 130/68  01/20/18 110/68   Pulse Readings from Last 3 Encounters:  10/27/18 62  03/26/18 80  01/20/18 73    BP: 118/64 Pulse 64, resp 16  Well nourished, well developed male in no acute distress. Constitutional:  oriented to person, place, and time. No distress.    ASSESSMENT & PLAN:    Ascending aorta dilation (HCC) Discussed with him, he prefers to delay any imaging this year and prefers CT coronary calcium scoring January 2021 for comparison Was 4 cm in January 2019, mildly dilated Pathology discussed with him in detail We will place an order  Agatston coronary artery calcium score between 200 and 399 Cholesterol at goal, nondiabetic, non-smoker  No further testing needed  chest pain, unspecified type Denies any further chest pain concerning for angina   dyspnea on exertion Reports he is walking on a regular basis 45 minutes with no chest pain or shortness of breath  Pure hypercholesterolemia Cholesterol is at goal on the current lipid regimen. No changes to the medications were made. He will have repeat lab work with primary care  Essential hypertension Blood pressure within excellent range Recommend he increase his fluids for any orthostasis symptoms   COVID-19 Education: The signs and symptoms of COVID-19 were discussed with the patient and how to seek care for testing (follow up with PCP or arrange E-visit).  The importance of social distancing was discussed today.  Patient Risk:   After full review of this patients clinical status, I feel that they are at least moderate risk at this time.  Time:   Today, I have spent 25 minutes with the patient with telehealth technology discussing the cardiac and medical problems/diagnoses detailed above   10 min spent reviewing the chart prior to patient visit today   Medication Adjustments/Labs and Tests  Ordered: Current medicines are reviewed at length with the patient today.  Concerns regarding medicines are outlined above.   Tests Ordered: CT coronary calcium score as above   Medication Changes: No changes made   Disposition: Follow-up in 12 months   Signed, Ida Rogue, MD  11/06/2018 7:57 AM    [place] Office [address] [place] [phone], [place], [place] [phone]

## 2018-11-06 NOTE — Patient Instructions (Addendum)
Medication Instructions:  No changes  If you need a refill on your cardiac medications before your next appointment, please call your pharmacy.    Lab work: No new labs needed   If you have labs (blood work) drawn today and your tests are completely normal, you will receive your results only by: Marland Kitchen MyChart Message (if you have MyChart) OR . A paper copy in the mail If you have any lab test that is abnormal or we need to change your treatment, we will call you to review the results.   Testing/Procedures: CT Coronary Calcium Score to be done in January 2021 for dilated aorta $150   Please call [phone] to schedule in November or December 2020  Location of testing:    [facility] [address] ([place]) [place], [place] [address]  Follow-Up: At [facility], you and your health needs are our priority.  As part of our continuing mission to provide you with exceptional heart care, we have created designated Provider Care Teams.  These Care Teams include your primary Cardiologist (physician) and Advanced Practice Providers (APPs -  Physician Assistants and Nurse Practitioners) who all work together to provide you with the care you need, when you need it.  . You will need a follow up appointment in 12 months .   Please call our office 2 months in advance to schedule this appointment.    . Providers on your designated Care Team:   . Murray Hodgkins, NP . Christell Faith, PA-C . Marrianne Mood, PA-C  Any Other Special Instructions Will Be Listed Below (If Applicable).  For educational health videos Log in to : [email or web address] Or : [email or web address], password : [email or web address]

## 2019-01-07 DIAGNOSIS — N62 Hypertrophy of breast: Secondary | ICD-10-CM | POA: Diagnosis not present

## 2019-01-09 ENCOUNTER — Other Ambulatory Visit: Payer: Self-pay | Admitting: Surgery

## 2019-01-09 DIAGNOSIS — N62 Hypertrophy of breast: Secondary | ICD-10-CM

## 2019-01-14 ENCOUNTER — Telehealth: Payer: Self-pay | Admitting: Family Medicine

## 2019-01-14 MED ORDER — SIMVASTATIN 20 MG PO TABS: 20.0000 mg | ORAL_TABLET | Freq: Every day | ORAL | 1 refills | Status: DC

## 2019-01-14 NOTE — Telephone Encounter (Signed)
Refill on simvastatin to [facility].

## 2019-01-14 NOTE — Telephone Encounter (Signed)
Medication called/sent to requested pharmacy  

## 2019-01-28 ENCOUNTER — Ambulatory Visit
Admission: RE | Admit: 2019-01-28 | Discharge: 2019-01-28 | Disposition: A | Discharge status: Home / Self Care | Payer: Medicare Other | Source: Ambulatory Visit | Attending: Surgery | Admitting: Surgery

## 2019-01-28 ENCOUNTER — Other Ambulatory Visit: Payer: Self-pay

## 2019-01-28 ENCOUNTER — Ambulatory Visit: Payer: Medicare Other

## 2019-01-28 DIAGNOSIS — R928 Other abnormal and inconclusive findings on diagnostic imaging of breast: Secondary | ICD-10-CM | POA: Diagnosis not present

## 2019-01-28 DIAGNOSIS — N62 Hypertrophy of breast: Secondary | ICD-10-CM

## 2019-02-13 ENCOUNTER — Other Ambulatory Visit: Payer: Medicare Other

## 2019-02-13 ENCOUNTER — Other Ambulatory Visit: Payer: Self-pay

## 2019-02-13 DIAGNOSIS — I10 Essential (primary) hypertension: Secondary | ICD-10-CM

## 2019-02-13 DIAGNOSIS — E785 Hyperlipidemia, unspecified: Secondary | ICD-10-CM

## 2019-02-13 DIAGNOSIS — R7303 Prediabetes: Secondary | ICD-10-CM

## 2019-02-13 DIAGNOSIS — E119 Type 2 diabetes mellitus without complications: Secondary | ICD-10-CM

## 2019-02-18 ENCOUNTER — Other Ambulatory Visit: Payer: Medicare Other

## 2019-02-18 ENCOUNTER — Other Ambulatory Visit: Payer: Self-pay

## 2019-02-18 DIAGNOSIS — E785 Hyperlipidemia, unspecified: Secondary | ICD-10-CM

## 2019-02-18 DIAGNOSIS — E119 Type 2 diabetes mellitus without complications: Secondary | ICD-10-CM | POA: Diagnosis not present

## 2019-02-18 DIAGNOSIS — R7303 Prediabetes: Secondary | ICD-10-CM

## 2019-02-18 DIAGNOSIS — I10 Essential (primary) hypertension: Secondary | ICD-10-CM | POA: Diagnosis not present

## 2019-02-19 ENCOUNTER — Encounter: Payer: Self-pay | Admitting: *Deleted

## 2019-02-19 LAB — LIPID PANEL
Cholesterol: 135 mg/dL (ref <200)
HDL: 52 mg/dL (ref >=40)
LDL Cholesterol (Calc): 66 mg/dL (calc)
Non-HDL Cholesterol (Calc): 83 mg/dL (calc) (ref <130)
Total CHOL/HDL Ratio: 2.6 (calc) (ref <5.0)
Triglycerides: 88 mg/dL (ref <150)

## 2019-02-19 LAB — COMPLETE METABOLIC PANEL WITH GFR
AG Ratio: 2 (calc) (ref 1.0–2.5)
ALT: 18 U/L (ref 9–46)
AST: 20 U/L (ref 10–35)
Albumin: 4.1 g/dL (ref 3.6–5.1)
Alkaline phosphatase (APISO): 59 U/L (ref 35–144)
BUN: 16 mg/dL (ref 7–25)
CO2: 27 mmol/L (ref 20–32)
Calcium: 9.4 mg/dL (ref 8.6–10.3)
Chloride: 101 mmol/L (ref 98–110)
Creat: 1.02 mg/dL (ref 0.70–1.11)
GFR, Est African American: 79 mL/min/{1.73_m2} (ref >=60)
GFR, Est Non African American: 68 mL/min/{1.73_m2} (ref >=60)
Globulin: 2.1 g/dL (calc) (ref 1.9–3.7)
Glucose, Bld: 99 mg/dL (ref 65–99)
Potassium: 4.8 mmol/L (ref 3.5–5.3)
Sodium: 138 mmol/L (ref 135–146)
Total Bilirubin: 0.8 mg/dL (ref 0.2–1.2)
Total Protein: 6.2 g/dL (ref 6.1–8.1)

## 2019-02-19 LAB — CBC WITH DIFFERENTIAL/PLATELET
Absolute Monocytes: 506 cells/uL (ref 200–950)
Basophils Absolute: 79 cells/uL (ref 0–200)
Basophils Relative: 1.3 %
Eosinophils Absolute: 232 cells/uL (ref 15–500)
Eosinophils Relative: 3.8 %
HCT: 43.8 % (ref 38.5–50.0)
Hemoglobin: 14.3 g/dL (ref 13.2–17.1)
Lymphs Abs: 1598 cells/uL (ref 850–3900)
MCH: 28.9 pg (ref 27.0–33.0)
MCHC: 32.6 g/dL (ref 32.0–36.0)
MCV: 88.7 fL (ref 80.0–100.0)
MPV: 9.8 fL (ref 7.5–12.5)
Monocytes Relative: 8.3 %
Neutro Abs: 3684 cells/uL (ref 1500–7800)
Neutrophils Relative %: 60.4 %
Platelets: 285 10*3/uL (ref 140–400)
RBC: 4.94 10*6/uL (ref 4.20–5.80)
RDW: 12.5 % (ref 11.0–15.0)
Total Lymphocyte: 26.2 %
WBC: 6.1 10*3/uL (ref 3.8–10.8)

## 2019-02-19 LAB — HEMOGLOBIN A1C
Hgb A1c MFr Bld: 5.7 % of total Hgb — ABNORMAL HIGH (ref <5.7)
Mean Plasma Glucose: 117 (calc)
eAG (mmol/L): 6.5 (calc)

## 2019-03-05 ENCOUNTER — Other Ambulatory Visit: Payer: Self-pay

## 2019-03-06 ENCOUNTER — Encounter: Payer: Self-pay | Admitting: Family Medicine

## 2019-03-06 ENCOUNTER — Other Ambulatory Visit: Payer: Self-pay

## 2019-03-06 ENCOUNTER — Ambulatory Visit (INDEPENDENT_AMBULATORY_CARE_PROVIDER_SITE_OTHER): Payer: Medicare Other | Admitting: Family Medicine

## 2019-03-06 VITALS — BP 124/78 | HR 58 | Temp 98.6°F | Resp 15 | Ht 74.0 in | Wt 204.0 lb

## 2019-03-06 DIAGNOSIS — Z0001 Encounter for general adult medical examination with abnormal findings: Secondary | ICD-10-CM | POA: Diagnosis not present

## 2019-03-06 DIAGNOSIS — R7303 Prediabetes: Secondary | ICD-10-CM

## 2019-03-06 DIAGNOSIS — Z0000 Encounter for general adult medical examination without abnormal findings: Secondary | ICD-10-CM

## 2019-03-06 DIAGNOSIS — I10 Essential (primary) hypertension: Secondary | ICD-10-CM

## 2019-03-06 DIAGNOSIS — E785 Hyperlipidemia, unspecified: Secondary | ICD-10-CM | POA: Diagnosis not present

## 2019-03-06 DIAGNOSIS — Z23 Encounter for immunization: Secondary | ICD-10-CM | POA: Diagnosis not present

## 2019-03-06 NOTE — Progress Notes (Signed)
`   Subjective:    Patient ID: Jesse Fletcher., male    DOB: [birth year], 84 y.o.   MRN: [id number]  HPI  Lab on 02/13/2019  Component Date Value Ref Range Status  . Hgb A1c MFr Bld 02/18/2019 5.7* <5.7 % of total Hgb Final   Comment: For someone without known diabetes, a hemoglobin  A1c value between 5.7% and 6.4% is consistent with prediabetes and should be confirmed with a  follow-up test. . For someone with known diabetes, a value <7% indicates that their diabetes is well controlled. A1c targets should be individualized based on duration of diabetes, age, comorbid conditions, and other considerations. . This assay result is consistent with an increased risk of diabetes. . Currently, no consensus exists regarding use of hemoglobin A1c for diagnosis of diabetes for children. .   . Mean Plasma Glucose 02/18/2019 117  (calc) Final  . eAG (mmol/L) 02/18/2019 6.5  (calc) Final  . Cholesterol 02/18/2019 135  <200 mg/dL Final  . HDL 02/18/2019 52  > OR = 40 mg/dL Final  . Triglycerides 02/18/2019 88  <150 mg/dL Final  . LDL Cholesterol (Calc) 02/18/2019 66  mg/dL (calc) Final   Comment: Reference range: <100 . Desirable range <100 mg/dL for primary prevention;   <70 mg/dL for patients with CHD or diabetic patients  with > or = 2 CHD risk factors. Marland Kitchen LDL-C is now calculated using the Martin-Hopkins  calculation, which is a validated novel method providing  better accuracy than the Friedewald equation in the  estimation of LDL-C.  Cresenciano Genre et al. Annamaria Helling. [id number]): 2061-2068  (http://education.[facility].com/faq/FAQ164)   . Total CHOL/HDL Ratio 02/18/2019 2.6  <5.0 (calc) Final  . Non-HDL Cholesterol (Calc) 02/18/2019 83  <130 mg/dL (calc) Final   Comment: For patients with diabetes plus 1 major ASCVD risk  factor, treating to a non-HDL-C goal of <100 mg/dL  (LDL-C of <70 mg/dL) is considered a therapeutic  option.   . Glucose, Bld 02/18/2019 99  65 - 99 mg/dL  Final   Comment: .            Fasting reference interval .   . BUN 02/18/2019 16  7 - 25 mg/dL Final  . Creat 02/18/2019 1.02  0.70 - 1.11 mg/dL Final   Comment: For patients >60 years of age, the reference limit for Creatinine is approximately 13% higher for people identified as African-American. .   . GFR, Est Non African American 02/18/2019 68  > OR = 60 mL/min/1.67m2 Final  . GFR, Est African American 02/18/2019 79  > OR = 60 mL/min/1.76m2 Final  . BUN/Creatinine Ratio [id number] NOT APPLICABLE  6 - 22 (calc) Final  . Sodium 02/18/2019 138  135 - 146 mmol/L Final  . Potassium 02/18/2019 4.8  3.5 - 5.3 mmol/L Final  . Chloride 02/18/2019 101  98 - 110 mmol/L Final  . CO2 02/18/2019 27  20 - 32 mmol/L Final  . Calcium 02/18/2019 9.4  8.6 - 10.3 mg/dL Final  . Total Protein 02/18/2019 6.2  6.1 - 8.1 g/dL Final  . Albumin 02/18/2019 4.1  3.6 - 5.1 g/dL Final  . Globulin 02/18/2019 2.1  1.9 - 3.7 g/dL (calc) Final  . AG Ratio 02/18/2019 2.0  1.0 - 2.5 (calc) Final  . Total Bilirubin 02/18/2019 0.8  0.2 - 1.2 mg/dL Final  . Alkaline phosphatase (APISO) 02/18/2019 59  35 - 144 U/L Final  . AST 02/18/2019 20  10 - 35 U/L Final  .  ALT 02/18/2019 18  9 - 46 U/L Final  . WBC 02/18/2019 6.1  3.8 - 10.8 Thousand/uL Final  . RBC 02/18/2019 4.94  4.20 - 5.80 Million/uL Final  . Hemoglobin 02/18/2019 14.3  13.2 - 17.1 g/dL Final  . HCT 02/18/2019 43.8  38.5 - 50.0 % Final  . MCV 02/18/2019 88.7  80.0 - 100.0 fL Final  . MCH 02/18/2019 28.9  27.0 - 33.0 pg Final  . MCHC 02/18/2019 32.6  32.0 - 36.0 g/dL Final  . RDW 02/18/2019 12.5  11.0 - 15.0 % Final  . Platelets 02/18/2019 285  140 - 400 Thousand/uL Final  . MPV 02/18/2019 9.8  7.5 - 12.5 fL Final  . Neutro Abs 02/18/2019 3,684  1,500 - 7,800 cells/uL Final  . Lymphs Abs 02/18/2019 1,598  850 - 3,900 cells/uL Final  . Absolute Monocytes 02/18/2019 506  200 - 950 cells/uL Final  . Eosinophils Absolute 02/18/2019 232  15 - 500 cells/uL  Final  . Basophils Absolute 02/18/2019 79  0 - 200 cells/uL Final  . Neutrophils Relative % 02/18/2019 60.4  % Final  . Total Lymphocyte 02/18/2019 26.2  % Final  . Monocytes Relative 02/18/2019 8.3  % Final  . Eosinophils Relative 02/18/2019 3.8  % Final  . Basophils Relative 02/18/2019 1.3  % Final   Patient is here today for complete physical exam.  He denies any concerns.  Specifically he denies any problems with memory loss, falls, or depression.  He denies any chest pain shortness of breath or dyspnea on exertion.  He is due today for a flu shot however the remainder of his immunizations are up-to-date. Immunization History  Administered Date(s) Administered  . Fluad Quad(high Dose 65+) 03/06/2019  . Influenza, High Dose Seasonal PF 04/19/2017  . Influenza,inj,Quad PF,6+ Mos 04/08/2013, 03/29/2014, 03/25/2015, 04/13/2016, 05/01/2018  . Pneumococcal Conjugate-13 10/30/2013  . Pneumococcal Polysaccharide-23 11/10/2010   Patient received his flu shot today already.  Patient's cancer screening is no longer recommended due to his age greater than 8.  I have recommended against prostate cancer screening or colon cancer screening.  Only issue of determine is that the patient has diminished hearing.  However he refuses hearing aids. Past Medical History:  Diagnosis Date  . Blood in stool   . Hemorrhoids   . Hyperlipidemia   . Hypertension   . Rectal fissure   . Rectal pain    Past Surgical History:  Procedure Laterality Date  . HERNIA REPAIR  2000 (approx)  . lower back surgery  05/1995, 05/1998  . SHOULDER SURGERY  1997   both   Current Outpatient Medications on File Prior to Visit  Medication Sig Dispense Refill  . aspirin 81 MG tablet Take 81 mg by mouth daily.      Marland Kitchen lisinopril-hydrochlorothiazide (PRINZIDE,ZESTORETIC) 10-12.5 MG tablet Take 1 tablet by mouth twice daily 180 tablet 1  . metoprolol tartrate (LOPRESSOR) 25 MG tablet TAKE 1 TABLET BY MOUTH TWICE DAILY 180 tablet  4  . Multiple Vitamins-Minerals (CENTRUM SILVER ADULT 50+ PO) Take 1 tablet by mouth daily.    . simvastatin (ZOCOR) 20 MG tablet Take 1 tablet (20 mg total) by mouth at bedtime. 90 tablet 1   No current facility-administered medications on file prior to visit.    No Known Allergies Social History   Socioeconomic History  . Marital status: Widowed    Spouse name: Not on file  . Number of children: Not on file  . Years of education: Not on  file  . Highest education level: Not on file  Occupational History  . Not on file  Social Needs  . Financial resource strain: Not on file  . Food insecurity    Worry: Not on file    Inability: Not on file  . Transportation needs    Medical: Not on file    Non-medical: Not on file  Tobacco Use  . Smoking status: Never Smoker  . Smokeless tobacco: Never Used  Substance and Sexual Activity  . Alcohol use: No  . Drug use: No  . Sexual activity: Not on file  Lifestyle  . Physical activity    Days per week: Not on file    Minutes per session: Not on file  . Stress: Not on file  Relationships  . Social Herbalist on phone: Not on file    Gets together: Not on file    Attends religious service: Not on file    Active member of club or organization: Not on file    Attends meetings of clubs or organizations: Not on file    Relationship status: Not on file  . Intimate partner violence    Fear of current or ex partner: Not on file    Emotionally abused: Not on file    Physically abused: Not on file    Forced sexual activity: Not on file  Other Topics Concern  . Not on file  Social History Narrative  . Not on file     Review of Systems  All other systems reviewed and are negative.      Objective:   Physical Exam  Constitutional: He appears well-developed and well-nourished. No distress.  HENT:  Head: Normocephalic.  Right Ear: External ear normal.  Left Ear: External ear normal.  Nose: Nose normal.  Mouth/Throat:  Oropharynx is clear and moist. No oropharyngeal exudate.  Eyes: Pupils are equal, round, and reactive to light. Conjunctivae and EOM are normal.  Neck: Neck supple.  Cardiovascular: Normal rate, regular rhythm and normal heart sounds.  Pulmonary/Chest: Effort normal and breath sounds normal. No respiratory distress. He has no wheezes. He has no rales.  Abdominal: Soft. Bowel sounds are normal. He exhibits no distension. There is no abdominal tenderness. There is no rebound.  Lymphadenopathy:    He has no cervical adenopathy.  Skin: No rash noted. He is not diaphoretic. No erythema.  Vitals reviewed.         Assessment & Plan:  Need for immunization against influenza - Plan: Flu Vaccine QUAD High Dose(Fluad)  Prediabetes  Benign essential HTN  General medical exam  Hyperlipidemia, unspecified hyperlipidemia type  Patient's physical exam is outstanding.  Lab work is excellent.  Blood pressures well controlled.  Patient received his flu shot today.  He denies any issues with depression, falls, or memory loss.  Cancer screening is not recommended.  Strongly recommended the patient consider hearing aids.  Otherwise regular anticipatory guidance is provided.

## 2019-03-18 ENCOUNTER — Telehealth: Payer: Self-pay | Admitting: Family Medicine

## 2019-03-18 NOTE — Telephone Encounter (Signed)
[facility] [address]  Patient says he has sinus infection tried to get an appointment, but we did not have anything, would like to know if antibiotic can be called in for him  [facility] [address]

## 2019-03-19 ENCOUNTER — Other Ambulatory Visit: Payer: Self-pay | Admitting: Family Medicine

## 2019-03-19 MED ORDER — AMOXICILLIN 875 MG PO TABS: 875.0000 mg | ORAL_TABLET | Freq: Two times a day (BID) | ORAL | 0 refills | Status: DC

## 2019-03-19 NOTE — Telephone Encounter (Signed)
Pt aware.

## 2019-03-19 NOTE — Telephone Encounter (Signed)
I sent in amoxicillin

## 2019-03-23 DIAGNOSIS — L570 Actinic keratosis: Secondary | ICD-10-CM | POA: Diagnosis not present

## 2019-03-28 ENCOUNTER — Other Ambulatory Visit: Payer: Self-pay | Admitting: Family Medicine

## 2019-06-15 DIAGNOSIS — D485 Neoplasm of uncertain behavior of skin: Secondary | ICD-10-CM | POA: Diagnosis not present

## 2019-06-15 DIAGNOSIS — L570 Actinic keratosis: Secondary | ICD-10-CM | POA: Diagnosis not present

## 2019-07-09 ENCOUNTER — Other Ambulatory Visit: Payer: Self-pay | Admitting: Family Medicine

## 2019-07-21 ENCOUNTER — Ambulatory Visit: Payer: Medicare Other | Attending: Internal Medicine

## 2019-07-21 DIAGNOSIS — Z23 Encounter for immunization: Secondary | ICD-10-CM

## 2019-07-21 NOTE — Progress Notes (Signed)
   [id number] [facility]  Name:  Jesse Fletcher.    MRN: [id number] DOB: Jul 18, 1934  07/21/2019  Jesse Fletcher was observed post Covid-19 immunization for 15 minutes without incidence. He was provided with Vaccine Information Sheet and instruction to access the V-Safe system.   Jesse Fletcher was instructed to call 911 with any severe reactions post vaccine: Marland Kitchen Difficulty breathing  . Swelling of your face and throat  . A fast heartbeat  . A bad rash all over your body  . Dizziness and weakness    Immunizations Administered    Name Date Dose VIS Date Route   Pfizer COVID-19 Vaccine 07/21/2019  9:58 AM 0.3 mL 06/12/2019 Intramuscular   Manufacturer: [place]   Lot: [id number]   [place]: [id number]

## 2019-08-10 ENCOUNTER — Ambulatory Visit: Payer: Medicare Other | Attending: Internal Medicine

## 2019-08-10 DIAGNOSIS — Z23 Encounter for immunization: Secondary | ICD-10-CM | POA: Insufficient documentation

## 2019-08-10 NOTE — Progress Notes (Signed)
   [id number] Vaccination [facility]  Name:  Jesse Fletcher.    MRN: [id number] DOB: 04/14/1935  08/10/2019  Jesse Fletcher was observed post Covid-19 immunization for 15 minutes without incidence. He was provided with Vaccine Information Sheet and instruction to access the V-Safe system.   Jesse Fletcher was instructed to call 911 with any severe reactions post vaccine: Marland Kitchen Difficulty breathing  . Swelling of your face and throat  . A fast heartbeat  . A bad rash all over your body  . Dizziness and weakness    Immunizations Administered    Name Date Dose VIS Date Route   Pfizer COVID-19 Vaccine 08/10/2019  8:53 AM 0.3 mL 06/12/2019 Intramuscular   Manufacturer: [place]   Lot: [id number]   [place]: [id number]

## 2019-08-25 DIAGNOSIS — C44311 Basal cell carcinoma of skin of nose: Secondary | ICD-10-CM | POA: Diagnosis not present

## 2019-09-09 DIAGNOSIS — Z87442 Personal history of urinary calculi: Secondary | ICD-10-CM | POA: Diagnosis not present

## 2019-09-09 DIAGNOSIS — N411 Chronic prostatitis: Secondary | ICD-10-CM | POA: Diagnosis not present

## 2019-09-09 DIAGNOSIS — R351 Nocturia: Secondary | ICD-10-CM | POA: Diagnosis not present

## 2019-09-09 DIAGNOSIS — N401 Enlarged prostate with lower urinary tract symptoms: Secondary | ICD-10-CM | POA: Diagnosis not present

## 2019-10-13 ENCOUNTER — Other Ambulatory Visit: Payer: Self-pay | Admitting: Family Medicine

## 2019-11-12 DIAGNOSIS — L905 Scar conditions and fibrosis of skin: Secondary | ICD-10-CM | POA: Diagnosis not present

## 2019-11-12 DIAGNOSIS — L570 Actinic keratosis: Secondary | ICD-10-CM | POA: Diagnosis not present

## 2019-11-12 DIAGNOSIS — Z85828 Personal history of other malignant neoplasm of skin: Secondary | ICD-10-CM | POA: Diagnosis not present

## 2019-12-26 ENCOUNTER — Other Ambulatory Visit: Payer: Self-pay | Admitting: Family Medicine

## 2019-12-28 ENCOUNTER — Other Ambulatory Visit: Payer: Self-pay

## 2019-12-28 MED ORDER — SIMVASTATIN 20 MG PO TABS: 20.0000 mg | ORAL_TABLET | Freq: Every day | ORAL | 0 refills | Status: DC

## 2019-12-28 NOTE — Telephone Encounter (Signed)
Last office visit:  03/06/19

## 2020-01-10 ENCOUNTER — Other Ambulatory Visit: Payer: Self-pay | Admitting: Family Medicine

## 2020-01-11 ENCOUNTER — Other Ambulatory Visit: Payer: Self-pay

## 2020-01-11 ENCOUNTER — Telehealth: Payer: Self-pay | Admitting: Family Medicine

## 2020-01-11 MED ORDER — LISINOPRIL-HYDROCHLOROTHIAZIDE 10-12.5 MG PO TABS: 1.0000 | ORAL_TABLET | Freq: Two times a day (BID) | ORAL | 0 refills | Status: DC

## 2020-01-11 NOTE — Telephone Encounter (Signed)
CB# [id number] Pt was told his medication wasn't going to be filled until appt has been schedule appt schedule  on 03-10-20

## 2020-01-11 NOTE — Progress Notes (Signed)
Date:  01/11/2020   ID:  Jesse Doyne., DOB 09/21/1934, MRN [id number]  Patient Location:  [place] [address]   Provider location:   [facility], [place] office  PCP:  Susy Frizzle, MD  Cardiologist:  Arvid Right [facility]  Chief Complaint  Patient presents with   office visit    Pt has no complaints. Meds verbally reviewed w/ pt.     History of Present Illness:    Jesse Pautz. is a 85 y.o. male past medical history of Chest pain HTN Hyperlipidemia Who presents for follow up of his chest pain, dilated aorta, coronary calcium on CT scan  On today's visit he reports that he is doing well, Denies any chest pain or shortness of breath on exertion Splits his time between [place] and the beach  Does regular exercise, walks daily Hydrating on a regular basis  Discussed previous CT scan January 2019 Details as below Ascending Aorta: Ascending aortic root dilatation 4.0 cm with atherosclerosis Coronary arteries: 3 vessel coronary artery calcium noted Coronary calcium score of 209 . This was 8 th percentile for age and sex matched control.  Lab work reviewed Total chol 133, LDL 68, tolerating statin with no significant side effects HBA1C 5.8 Nonsmoker  EKG personally reviewed by myself on todays visit Normal sinus rhythm  rate 60 bpm no significant ST-T wave changes First-degree AV block  Other past medical history reviewed  Prior history of chest discomfort  took some Rolaids without relief   EKG which showed no acute changes,  stat troponin I  negative.  GI cocktail given  Prior episodes of SOB moving snow, moving branches Unusually hard activity Based on the symptoms echocardiogram and stress echo was ordered That showed no ischemia  Echo stress Treadmill showing low exercise tolerance No wall motion abnormality concerning for ischemia Mildly dilated aortic root 38 mm   Prior CV studies:     The following studies were reviewed today:  Past Medical History:  Diagnosis Date   Blood in stool    Hemorrhoids    Hyperlipidemia    Hypertension    Rectal fissure    Rectal pain    Past Surgical History:  Procedure Laterality Date   HERNIA REPAIR  2000 (approx)   lower back surgery  05/1995, 05/1998   [place]   both     No outpatient medications have been marked as taking for the 01/12/20 encounter (Appointment) with Minna Merritts, MD.     Allergies:   Patient has no known allergies.   Social History   Tobacco Use   Smoking status: Never Smoker   Smokeless tobacco: Never Used  Substance Use Topics   Alcohol use: No   Drug use: No     Current Outpatient Medications on File Prior to Visit  Medication Sig Dispense Refill   amoxicillin (AMOXIL) 875 MG tablet Take 1 tablet (875 mg total) by mouth 2 (two) times daily. 20 tablet 0   aspirin 81 MG tablet Take 81 mg by mouth daily.       lisinopril-hydrochlorothiazide (ZESTORETIC) 10-12.5 MG tablet Take 1 tablet by mouth 2 (two) times daily. 180 tablet 0   metoprolol tartrate (LOPRESSOR) 25 MG tablet Take 1 tablet by mouth twice daily 180 tablet 3   Multiple Vitamins-Minerals (CENTRUM SILVER ADULT 50+ PO) Take 1 tablet by mouth daily.     simvastatin (ZOCOR) 20  MG tablet TAKE 1 TABLET BY MOUTH AT BEDTIME 90 tablet 0   No current facility-administered medications on file prior to visit.     Family Hx: The patient's family history is not on file.  ROS:   Please see the history of present illness.    Review of Systems  Constitutional: Negative.   HENT: Negative.   Respiratory: Negative.   Cardiovascular: Negative.   Gastrointestinal: Negative.   Musculoskeletal: Negative.   Neurological: Negative.   Psychiatric/Behavioral: Negative.   All other systems reviewed and are negative.    Labs/Other Tests and Data Reviewed:    Recent Labs: 02/18/2019: ALT 18; BUN 16; Creat 1.02;  Hemoglobin 14.3; Platelets 285; Potassium 4.8; Sodium 138   Recent Lipid Panel Lab Results  Component Value Date/Time   CHOL 135 02/18/2019 08:14 AM   TRIG 88 02/18/2019 08:14 AM   HDL 52 02/18/2019 08:14 AM   CHOLHDL 2.6 02/18/2019 08:14 AM   LDLCALC 66 02/18/2019 08:14 AM    Wt Readings from Last 3 Encounters:  03/06/19 204 lb (92.5 kg)  10/27/18 209 lb (94.8 kg)  03/26/18 210 lb (95.3 kg)     Exam:    BP 128/82 (BP Location: Left Arm, Patient Position: Sitting, Cuff Size: Normal)    Pulse 68    Ht 6\' 2"  (1.88 m)    Wt 214 lb 8 oz (97.3 kg)    SpO2 98%    BMI 27.54 kg/m    ASSESSMENT & PLAN:    Ascending aorta dilation (HCC)  4 cm in January 2019, mildly dilated No prior imaging available, none since that time Discussed options for repeat imaging including chest CT scan, echocardiogram Discussed limitations of each study He prefers to consider repeat imaging next year  [place] coronary artery calcium score between 200 and 399 Cholesterol at goal, he is a non-smoker and nondiabetic, asymptomatic with no angina  chest pain, unspecified type No chest pain symptoms on exertion, walks 45 minutes daily  dyspnea on exertion Denies having any recurrent symptoms, no further testing Prior stress test 2019 reviewed with him today Currently with no anginal symptoms  Pure hypercholesterolemia Cholesterol is at goal on the current lipid regimen. No changes to the medications were made.  Essential hypertension Staying hydrated Blood pressure stable   Total encounter time more than 25 minutes  Greater than 50% was spent in counseling and coordination of care with the patient   Signed, Ida Rogue, MD  01/11/2020 7:06 PM    [place] Office [address] [place] [address], [place], [place] [address]

## 2020-01-12 ENCOUNTER — Ambulatory Visit (INDEPENDENT_AMBULATORY_CARE_PROVIDER_SITE_OTHER): Payer: Medicare Other | Admitting: Cardiovascular Disease

## 2020-01-12 ENCOUNTER — Other Ambulatory Visit: Payer: Self-pay

## 2020-01-12 ENCOUNTER — Encounter: Payer: Self-pay | Admitting: Cardiovascular Disease

## 2020-01-12 VITALS — BP 128/82 | HR 68 | Ht 74.0 in | Wt 214.5 lb

## 2020-01-12 DIAGNOSIS — R0600 Dyspnea, unspecified: Secondary | ICD-10-CM

## 2020-01-12 DIAGNOSIS — I77810 Thoracic aortic ectasia: Secondary | ICD-10-CM | POA: Diagnosis not present

## 2020-01-12 DIAGNOSIS — R931 Abnormal findings on diagnostic imaging of heart and coronary circulation: Secondary | ICD-10-CM

## 2020-01-12 DIAGNOSIS — R079 Chest pain, unspecified: Secondary | ICD-10-CM | POA: Diagnosis not present

## 2020-01-12 DIAGNOSIS — I10 Essential (primary) hypertension: Secondary | ICD-10-CM

## 2020-01-12 DIAGNOSIS — E7800 Pure hypercholesterolemia, unspecified: Secondary | ICD-10-CM

## 2020-01-12 DIAGNOSIS — R0609 Other forms of dyspnea: Secondary | ICD-10-CM

## 2020-01-12 NOTE — Telephone Encounter (Signed)
Rx refilled Pt does have upcoming appt.

## 2020-01-12 NOTE — Patient Instructions (Signed)

## 2020-01-27 DIAGNOSIS — L570 Actinic keratosis: Secondary | ICD-10-CM | POA: Diagnosis not present

## 2020-01-27 DIAGNOSIS — Z85828 Personal history of other malignant neoplasm of skin: Secondary | ICD-10-CM | POA: Diagnosis not present

## 2020-01-27 DIAGNOSIS — L905 Scar conditions and fibrosis of skin: Secondary | ICD-10-CM | POA: Diagnosis not present

## 2020-03-04 ENCOUNTER — Other Ambulatory Visit: Payer: Medicare Other

## 2020-03-04 ENCOUNTER — Other Ambulatory Visit: Payer: Self-pay

## 2020-03-04 DIAGNOSIS — Z0000 Encounter for general adult medical examination without abnormal findings: Secondary | ICD-10-CM | POA: Diagnosis not present

## 2020-03-04 DIAGNOSIS — E785 Hyperlipidemia, unspecified: Secondary | ICD-10-CM

## 2020-03-04 DIAGNOSIS — I10 Essential (primary) hypertension: Secondary | ICD-10-CM | POA: Diagnosis not present

## 2020-03-04 DIAGNOSIS — R7303 Prediabetes: Secondary | ICD-10-CM | POA: Diagnosis not present

## 2020-03-05 LAB — CBC WITH DIFFERENTIAL/PLATELET
Absolute Monocytes: 564 cells/uL (ref 200–950)
Basophils Absolute: 63 cells/uL (ref 0–200)
Basophils Relative: 1.1 %
Eosinophils Absolute: 194 cells/uL (ref 15–500)
Eosinophils Relative: 3.4 %
HCT: 45.1 % (ref 38.5–50.0)
Hemoglobin: 15 g/dL (ref 13.2–17.1)
Lymphs Abs: 1511 cells/uL (ref 850–3900)
MCH: 29.3 pg (ref 27.0–33.0)
MCHC: 33.3 g/dL (ref 32.0–36.0)
MCV: 88.1 fL (ref 80.0–100.0)
MPV: 10.1 fL (ref 7.5–12.5)
Monocytes Relative: 9.9 %
Neutro Abs: 3369 cells/uL (ref 1500–7800)
Neutrophils Relative %: 59.1 %
Platelets: 266 10*3/uL (ref 140–400)
RBC: 5.12 10*6/uL (ref 4.20–5.80)
RDW: 12.6 % (ref 11.0–15.0)
Total Lymphocyte: 26.5 %
WBC: 5.7 10*3/uL (ref 3.8–10.8)

## 2020-03-05 LAB — COMPLETE METABOLIC PANEL WITH GFR
AG Ratio: 1.7 (calc) (ref 1.0–2.5)
ALT: 17 U/L (ref 9–46)
AST: 25 U/L (ref 10–35)
Albumin: 4 g/dL (ref 3.6–5.1)
Alkaline phosphatase (APISO): 70 U/L (ref 35–144)
BUN: 17 mg/dL (ref 7–25)
CO2: 26 mmol/L (ref 20–32)
Calcium: 8.8 mg/dL (ref 8.6–10.3)
Chloride: 98 mmol/L (ref 98–110)
Creat: 1.07 mg/dL (ref 0.70–1.11)
GFR, Est African American: 74 mL/min/{1.73_m2} (ref >=60)
GFR, Est Non African American: 64 mL/min/{1.73_m2} (ref >=60)
Globulin: 2.3 g/dL (calc) (ref 1.9–3.7)
Glucose, Bld: 106 mg/dL — ABNORMAL HIGH (ref 65–99)
Potassium: 4.3 mmol/L (ref 3.5–5.3)
Sodium: 133 mmol/L — ABNORMAL LOW (ref 135–146)
Total Bilirubin: 1 mg/dL (ref 0.2–1.2)
Total Protein: 6.3 g/dL (ref 6.1–8.1)

## 2020-03-05 LAB — LIPID PANEL
Cholesterol: 133 mg/dL (ref <200)
HDL: 53 mg/dL (ref >=40)
LDL Cholesterol (Calc): 64 mg/dL (calc)
Non-HDL Cholesterol (Calc): 80 mg/dL (calc) (ref <130)
Total CHOL/HDL Ratio: 2.5 (calc) (ref <5.0)
Triglycerides: 81 mg/dL (ref <150)

## 2020-03-05 LAB — HEMOGLOBIN A1C
Hgb A1c MFr Bld: 5.7 % of total Hgb — ABNORMAL HIGH (ref <5.7)
Mean Plasma Glucose: 117 (calc)
eAG (mmol/L): 6.5 (calc)

## 2020-03-09 DIAGNOSIS — L570 Actinic keratosis: Secondary | ICD-10-CM | POA: Diagnosis not present

## 2020-03-09 DIAGNOSIS — L578 Other skin changes due to chronic exposure to nonionizing radiation: Secondary | ICD-10-CM | POA: Diagnosis not present

## 2020-03-09 DIAGNOSIS — D485 Neoplasm of uncertain behavior of skin: Secondary | ICD-10-CM | POA: Diagnosis not present

## 2020-03-09 DIAGNOSIS — C44729 Squamous cell carcinoma of skin of left lower limb, including hip: Secondary | ICD-10-CM | POA: Diagnosis not present

## 2020-03-10 ENCOUNTER — Ambulatory Visit (INDEPENDENT_AMBULATORY_CARE_PROVIDER_SITE_OTHER): Payer: Medicare Other | Admitting: Family Medicine

## 2020-03-10 ENCOUNTER — Other Ambulatory Visit: Payer: Self-pay

## 2020-03-10 VITALS — BP 120/70 | HR 62 | Temp 97.9°F | Ht 74.0 in | Wt 212.0 lb

## 2020-03-10 DIAGNOSIS — E785 Hyperlipidemia, unspecified: Secondary | ICD-10-CM | POA: Diagnosis not present

## 2020-03-10 DIAGNOSIS — Z23 Encounter for immunization: Secondary | ICD-10-CM

## 2020-03-10 DIAGNOSIS — I10 Essential (primary) hypertension: Secondary | ICD-10-CM | POA: Diagnosis not present

## 2020-03-10 DIAGNOSIS — Z0001 Encounter for general adult medical examination with abnormal findings: Secondary | ICD-10-CM | POA: Diagnosis not present

## 2020-03-10 DIAGNOSIS — Z0000 Encounter for general adult medical examination without abnormal findings: Secondary | ICD-10-CM

## 2020-03-10 NOTE — Progress Notes (Signed)
`   Subjective:    Patient ID: Jesse Fletcher., male    DOB: 1935-01-19, 85 y.o.   MRN: [id number]  HPI Patient is an 85 year old Caucasian male here today for complete physical exam.  He denies any falls.  He denies any depression.  He denies any memory loss.  Patient's chart mentions that he is due for a diabetic eye exam and a diabetic foot exam however he is not a diabetic.  He does not even qualify for prediabetes.  His most recent fasting blood sugar is 106.  Given his age, I would not recommend a colonoscopy or prostate cancer screening.  Patient denies any blood in stool or lower urinary tract symptoms.  Overall he is been doing well.  He exercises on a daily basis.  His blood pressure is excellent 120/70.  He denies any chest pain shortness of breath or dyspnea on exertion.  He is due for a flu shot today: Immunization History  Administered Date(s) Administered  . Fluad Quad(high Dose 65+) 03/06/2019, 03/10/2020  . Influenza, High Dose Seasonal PF 04/19/2017  . Influenza,inj,Quad PF,6+ Mos 04/08/2013, 03/29/2014, 03/25/2015, 04/13/2016, 05/01/2018  . PFIZER SARS-COV-2 Vaccination 07/21/2019, 08/10/2019  . Pneumococcal Conjugate-13 10/30/2013  . Pneumococcal Polysaccharide-23 11/10/2010     Lab on 03/04/2020  Component Date Value Ref Range Status  . Cholesterol 03/04/2020 133  <200 mg/dL Final  . HDL 03/04/2020 53  > OR = 40 mg/dL Final  . Triglycerides 03/04/2020 81  <150 mg/dL Final  . LDL Cholesterol (Calc) 03/04/2020 64  mg/dL (calc) Final   Comment: Reference range: <100 . Desirable range <100 mg/dL for primary prevention;   <70 mg/dL for patients with CHD or diabetic patients  with > or = 2 CHD risk factors. Marland Kitchen LDL-C is now calculated using the Martin-Hopkins  calculation, which is a validated novel method providing  better accuracy than the Friedewald equation in the  estimation of LDL-C.  Cresenciano Genre et al. Annamaria Helling. 4166;063(01): 2061-2068    (http://education.[facility].com/faq/FAQ164)   . Total CHOL/HDL Ratio 03/04/2020 2.5  <5.0 (calc) Final  . Non-HDL Cholesterol (Calc) 03/04/2020 80  <130 mg/dL (calc) Final   Comment: For patients with diabetes plus 1 major ASCVD risk  factor, treating to a non-HDL-C goal of <100 mg/dL  (LDL-C of <70 mg/dL) is considered a therapeutic  option.   . Glucose, Bld 03/04/2020 106* 65 - 99 mg/dL Final   Comment: .            Fasting reference interval . For someone without known diabetes, a glucose value between 100 and 125 mg/dL is consistent with prediabetes and should be confirmed with a follow-up test. .   . BUN 03/04/2020 17  7 - 25 mg/dL Final  . Creat 03/04/2020 1.07  0.70 - 1.11 mg/dL Final   Comment: For patients >68 years of age, the reference limit for Creatinine is approximately 13% higher for people identified as African-American. .   . GFR, Est Non African American 03/04/2020 64  > OR = 60 mL/min/1.43m2 Final  . GFR, Est African American 03/04/2020 74  > OR = 60 mL/min/1.47m2 Final  . BUN/Creatinine Ratio 60/04/9322 NOT APPLICABLE  6 - 22 (calc) Final  . Sodium 03/04/2020 133* 135 - 146 mmol/L Final  . Potassium 03/04/2020 4.3  3.5 - 5.3 mmol/L Final  . Chloride 03/04/2020 98  98 - 110 mmol/L Final  . CO2 03/04/2020 26  20 - 32 mmol/L Final  . Calcium 03/04/2020 8.8  8.6 - 10.3 mg/dL Final  . Total Protein 03/04/2020 6.3  6.1 - 8.1 g/dL Final  . Albumin 03/04/2020 4.0  3.6 - 5.1 g/dL Final  . Globulin 03/04/2020 2.3  1.9 - 3.7 g/dL (calc) Final  . AG Ratio 03/04/2020 1.7  1.0 - 2.5 (calc) Final  . Total Bilirubin 03/04/2020 1.0  0.2 - 1.2 mg/dL Final  . Alkaline phosphatase (APISO) 03/04/2020 70  35 - 144 U/L Final  . AST 03/04/2020 25  10 - 35 U/L Final  . ALT 03/04/2020 17  9 - 46 U/L Final  . Hgb A1c MFr Bld 03/04/2020 5.7* <5.7 % of total Hgb Final   Comment: For someone without known diabetes, a hemoglobin  A1c value between 5.7% and 6.4% is consistent  with prediabetes and should be confirmed with a  follow-up test. . For someone with known diabetes, a value <7% indicates that their diabetes is well controlled. A1c targets should be individualized based on duration of diabetes, age, comorbid conditions, and other considerations. . This assay result is consistent with an increased risk of diabetes. . Currently, no consensus exists regarding use of hemoglobin A1c for diagnosis of diabetes for children. .   . Mean Plasma Glucose 03/04/2020 117  (calc) Final  . eAG (mmol/L) 03/04/2020 6.5  (calc) Final  . WBC 03/04/2020 5.7  3.8 - 10.8 Thousand/uL Final  . RBC 03/04/2020 5.12  4.20 - 5.80 Million/uL Final  . Hemoglobin 03/04/2020 15.0  13.2 - 17.1 g/dL Final  . HCT 03/04/2020 45.1  38 - 50 % Final  . MCV 03/04/2020 88.1  80.0 - 100.0 fL Final  . MCH 03/04/2020 29.3  27.0 - 33.0 pg Final  . MCHC 03/04/2020 33.3  32.0 - 36.0 g/dL Final  . RDW 03/04/2020 12.6  11.0 - 15.0 % Final  . Platelets 03/04/2020 266  140 - 400 Thousand/uL Final  . MPV 03/04/2020 10.1  7.5 - 12.5 fL Final  . Neutro Abs 03/04/2020 3,369  1,500 - 7,800 cells/uL Final  . Lymphs Abs 03/04/2020 1,511  850 - 3,900 cells/uL Final  . Absolute Monocytes 03/04/2020 564  200 - 950 cells/uL Final  . Eosinophils Absolute 03/04/2020 194  15 - 500 cells/uL Final  . Basophils Absolute 03/04/2020 63  0 - 200 cells/uL Final  . Neutrophils Relative % 03/04/2020 59.1  % Final  . Total Lymphocyte 03/04/2020 26.5  % Final  . Monocytes Relative 03/04/2020 9.9  % Final  . Eosinophils Relative 03/04/2020 3.4  % Final  . Basophils Relative 03/04/2020 1.1  % Final    Past Medical History:  Diagnosis Date  . Blood in stool   . Hemorrhoids   . Hyperlipidemia   . Hypertension   . Rectal fissure   . Rectal pain    Past Surgical History:  Procedure Laterality Date  . HERNIA REPAIR  2000 (approx)  . lower back surgery  05/1995, 05/1998  . SHOULDER SURGERY  1997   both    Current Outpatient Medications on File Prior to Visit  Medication Sig Dispense Refill  . aspirin 81 MG tablet Take 81 mg by mouth daily.      Marland Kitchen lisinopril-hydrochlorothiazide (ZESTORETIC) 10-12.5 MG tablet Take 1 tablet by mouth twice daily 60 tablet 0  . metoprolol tartrate (LOPRESSOR) 25 MG tablet Take 1 tablet by mouth twice daily 180 tablet 3  . Multiple Vitamins-Minerals (CENTRUM SILVER ADULT 50+ PO) Take 1 tablet by mouth daily.    . simvastatin (ZOCOR) 20  MG tablet TAKE 1 TABLET BY MOUTH AT BEDTIME 90 tablet 0   No current facility-administered medications on file prior to visit.   No Known Allergies Social History   Socioeconomic History  . Marital status: Widowed    Spouse name: Not on file  . Number of children: Not on file  . Years of education: Not on file  . Highest education level: Not on file  Occupational History  . Not on file  Tobacco Use  . Smoking status: Never Smoker  . Smokeless tobacco: Never Used  Vaping Use  . Vaping Use: Never used  Substance and Sexual Activity  . Alcohol use: No  . Drug use: No  . Sexual activity: Not on file  Other Topics Concern  . Not on file  Social History Narrative  . Not on file   Social Determinants of Health   Financial Resource Strain:   . Difficulty of Paying Living Expenses: Not on file  Food Insecurity:   . Worried About Charity fundraiser in the Last Year: Not on file  . Ran Out of Food in the Last Year: Not on file  Transportation Needs:   . Lack of Transportation (Medical): Not on file  . Lack of Transportation (Non-Medical): Not on file  Physical Activity:   . Days of Exercise per Week: Not on file  . Minutes of Exercise per Session: Not on file  Stress:   . Feeling of Stress : Not on file  Social Connections:   . Frequency of Communication with Friends and Family: Not on file  . Frequency of Social Gatherings with Friends and Family: Not on file  . Attends Religious Services: Not on file  .  Active Member of Clubs or Organizations: Not on file  . Attends Archivist Meetings: Not on file  . Marital Status: Not on file  Intimate Partner Violence:   . Fear of Current or Ex-Partner: Not on file  . Emotionally Abused: Not on file  . Physically Abused: Not on file  . Sexually Abused: Not on file     Review of Systems  All other systems reviewed and are negative.      Objective:   Physical Exam Vitals reviewed.  Constitutional:      General: He is not in acute distress.    Appearance: He is well-developed. He is not diaphoretic.  HENT:     Head: Normocephalic.     Right Ear: External ear normal.     Left Ear: External ear normal.     Nose: Nose normal.     Mouth/Throat:     Pharynx: No oropharyngeal exudate.  Eyes:     Conjunctiva/sclera: Conjunctivae normal.     Pupils: Pupils are equal, round, and reactive to light.  Cardiovascular:     Rate and Rhythm: Normal rate and regular rhythm.     Heart sounds: Normal heart sounds.  Pulmonary:     Effort: Pulmonary effort is normal. No respiratory distress.     Breath sounds: Normal breath sounds. No wheezing or rales.  Abdominal:     General: Bowel sounds are normal. There is no distension.     Palpations: Abdomen is soft.     Tenderness: There is no abdominal tenderness. There is no rebound.  Musculoskeletal:     Cervical back: Neck supple.  Lymphadenopathy:     Cervical: No cervical adenopathy.  Skin:    Findings: No erythema or rash.  Assessment & Plan:  Need for immunization against influenza - Plan: Flu Vaccine QUAD High Dose(Fluad)  General medical exam  Hyperlipidemia, unspecified hyperlipidemia type  Benign essential HTN  Physical exam today significant only for numerous actinic keratoses on his face particularly his right and left cheeks.  He is seeing a dermatologist and I have recommended Efudex.  Patient received his flu shot today.  I recommended a booster on his Covid  shot 8 months after his second dose.  Due to the patient's age, cancer screening is not recommended.  I reviewed his lab work and aside from a mild elevation in his blood sugar the rest of his lab work is outstanding.  Patient denies any falls, depression, or memory loss.  Regular anticipatory guidance is provided.  Blood pressure and cholesterol are outstanding.  No changes are made in his medication at this time

## 2020-03-27 ENCOUNTER — Other Ambulatory Visit: Payer: Self-pay | Admitting: Family Medicine

## 2020-04-09 ENCOUNTER — Other Ambulatory Visit: Payer: Self-pay | Admitting: Family Medicine

## 2020-04-13 ENCOUNTER — Other Ambulatory Visit: Payer: Self-pay

## 2020-04-13 NOTE — Telephone Encounter (Signed)
Jesse Fletcher needs refill on lisinopril-hydrochlorothiazide (ZESTORETIC) 10-12.5 MG tablet [facility] [place] - [address]  Pt is to see he can get longer refills so dont have to call in each month.   Pt [phone]

## 2020-04-13 NOTE — Telephone Encounter (Signed)
90 day supply sent to pharmacy

## 2020-04-20 DIAGNOSIS — M2021 Hallux rigidus, right foot: Secondary | ICD-10-CM | POA: Diagnosis not present

## 2020-04-20 DIAGNOSIS — M2022 Hallux rigidus, left foot: Secondary | ICD-10-CM | POA: Diagnosis not present

## 2020-05-19 DIAGNOSIS — L905 Scar conditions and fibrosis of skin: Secondary | ICD-10-CM | POA: Diagnosis not present

## 2020-05-19 DIAGNOSIS — Z85828 Personal history of other malignant neoplasm of skin: Secondary | ICD-10-CM | POA: Diagnosis not present

## 2020-05-19 DIAGNOSIS — L570 Actinic keratosis: Secondary | ICD-10-CM | POA: Diagnosis not present

## 2020-06-01 ENCOUNTER — Encounter: Payer: Self-pay | Admitting: Family Medicine

## 2020-06-01 ENCOUNTER — Other Ambulatory Visit: Payer: Self-pay

## 2020-06-01 ENCOUNTER — Ambulatory Visit (INDEPENDENT_AMBULATORY_CARE_PROVIDER_SITE_OTHER): Payer: Medicare Other | Admitting: Family Medicine

## 2020-06-01 VITALS — BP 124/68 | HR 68 | Temp 99.0°F | Resp 14 | Ht 74.0 in | Wt 212.0 lb

## 2020-06-01 DIAGNOSIS — J019 Acute sinusitis, unspecified: Secondary | ICD-10-CM

## 2020-06-01 MED ORDER — AMOXICILLIN 875 MG PO TABS: 875.0000 mg | ORAL_TABLET | Freq: Two times a day (BID) | ORAL | 0 refills | Status: DC

## 2020-06-01 NOTE — Progress Notes (Signed)
   Subjective:    Patient ID: Jesse Fletcher., male    DOB: [birth year], 85 y.o.   MRN: [id number]  Patient presents for Sinus Pressure (x1 week- nassal congestion, pressure to forehead, HA)  Pt here for sinus pressure and drainage for the past week. He had basal cell removed off nose in Feb Sinus headacche, post nasal drip, no  Fever, no cough, normal taste and smell He has had covid shots and booster and flu shot  He has been taking advil    Review Of Systems:  GEN- denies fatigue, fever, weight loss,weakness, recent illness HEENT- denies eye drainage, change in vision, +nasal discharge, CVS- denies chest pain, palpitations RESP- denies SOB, cough, wheeze ABD- denies N/V, change in stools, abd pain GU- denies dysuria, hematuria, dribbling, incontinence MSK- denies joint pain, muscle aches, injury Neuro- denies headache, dizziness, syncope, seizure activity       Objective:    BP 124/68   Pulse 68   Temp 99 F (37.2 C) (Temporal)   Resp 14   Ht 6\' 2"  (1.88 m)   Wt 212 lb (96.2 kg)   SpO2 99%   BMI 27.22 kg/m  GEN- NAD, alert and oriented x3 HEENT- PERRL, EOMI, non injected sclera, pink conjunctiva, MMM, oropharynx mild injection, TM clear bilat no effusion,  + maxillary sinus tenderness, inflammed turbinates,  Nasal drainage  Neck- Supple, no LAD CVS- RRR, no murmur RESP-CTAB EXT- No edema Pulses- Radial 2+          Assessment & Plan:     zyrtec samples given Problem List Items Addressed This Visit    None    Visit Diagnoses    Acute rhinosinusitis    -  Primary   contiue nasal rinse, anti-histamine, add amoxicillin   Relevant Medications   amoxicillin (AMOXIL) 875 MG tablet      Note: This dictation was prepared with Dragon dictation along with smaller phrase technology. Any transcriptional errors that result from this process are unintentional.

## 2020-06-01 NOTE — Patient Instructions (Signed)
Use your nasal spray  Take the zyrtec once a day for at least a week Take antibiotics as prescribed

## 2020-06-20 ENCOUNTER — Ambulatory Visit (INDEPENDENT_AMBULATORY_CARE_PROVIDER_SITE_OTHER): Payer: Medicare Other | Admitting: Family Medicine

## 2020-06-20 ENCOUNTER — Other Ambulatory Visit: Payer: Self-pay

## 2020-06-20 VITALS — BP 130/80 | HR 94 | Temp 98.2°F | Ht 74.0 in | Wt 212.0 lb

## 2020-06-20 DIAGNOSIS — R519 Headache, unspecified: Secondary | ICD-10-CM

## 2020-06-20 MED ORDER — PREDNISONE 20 MG PO TABS: ORAL_TABLET | ORAL | 0 refills | Status: DC

## 2020-06-20 MED ORDER — CEFDINIR 300 MG PO CAPS: 300.0000 mg | ORAL_CAPSULE | Freq: Two times a day (BID) | ORAL | 0 refills | Status: DC

## 2020-06-20 NOTE — Progress Notes (Signed)
Subjective:    Patient ID: Jesse Fletcher., male    DOB: 1934/10/05, 85 y.o.   MRN: [id number]  HPI  Patient saw my partner early December for a headache and was diagnosed with a sinus infection and was treated with amoxicillin and Zyrtec.  He saw no benefit.  He states that the headaches have been present now for about 5 weeks.  He reports a bandlike pain just above his eyes in his frontal sinuses bilaterally.  During the day they do not bother him so bad but when he lies down at night, he feels like pressure type pain that is constant.  He denies any fever.  He does have some trouble breathing through his nostrils and some postnasal drip but this is been a chronic problem for him.  He denies any blurry vision.  He denies any pain chewing.  He denies any pain in his shoulders or hips.  He denies any nausea or vomiting or dizziness.  He denies any photophobia or phonophobia.  He denies any neck pain.  He denies any weight loss.  He denies any neurologic deficits. Past Medical History:  Diagnosis Date  . Blood in stool   . Hemorrhoids   . Hyperlipidemia   . Hypertension   . Rectal fissure   . Rectal pain    Past Surgical History:  Procedure Laterality Date  . HERNIA REPAIR  2000 (approx)  . lower back surgery  05/1995, 05/1998  . SHOULDER SURGERY  1997   both   Current Outpatient Medications on File Prior to Visit  Medication Sig Dispense Refill  . lisinopril-hydrochlorothiazide (ZESTORETIC) 10-12.5 MG tablet Take 1 tablet by mouth twice daily 180 tablet 0  . metoprolol tartrate (LOPRESSOR) 25 MG tablet Take 1 tablet by mouth twice daily 180 tablet 0  . Multiple Vitamins-Minerals (CENTRUM SILVER ADULT 50+ PO) Take 1 tablet by mouth daily.    . simvastatin (ZOCOR) 20 MG tablet TAKE 1 TABLET BY MOUTH AT BEDTIME 90 tablet 0   No current facility-administered medications on file prior to visit.   Allergies  Allergen Reactions  . Doxycycline     GI upset    Social History    Socioeconomic History  . Marital status: Widowed    Spouse name: Not on file  . Number of children: Not on file  . Years of education: Not on file  . Highest education level: Not on file  Occupational History  . Not on file  Tobacco Use  . Smoking status: Never Smoker  . Smokeless tobacco: Never Used  Vaping Use  . Vaping Use: Never used  Substance and Sexual Activity  . Alcohol use: No  . Drug use: No  . Sexual activity: Not on file  Other Topics Concern  . Not on file  Social History Narrative  . Not on file   Social Determinants of Health   Financial Resource Strain: Not on file  Food Insecurity: Not on file  Transportation Needs: Not on file  Physical Activity: Not on file  Stress: Not on file  Social Connections: Not on file  Intimate Partner Violence: Not on file      Review of Systems  All other systems reviewed and are negative.      Objective:   Physical Exam Vitals reviewed.  Constitutional:      Appearance: Normal appearance. He is normal weight.  HENT:     Head: Normocephalic and atraumatic.     Right Ear: Tympanic membrane  and ear canal normal.     Left Ear: Tympanic membrane and ear canal normal.     Nose: Nose normal. No congestion or rhinorrhea.     Mouth/Throat:     Mouth: Mucous membranes are moist.     Pharynx: No oropharyngeal exudate or posterior oropharyngeal erythema.  Eyes:     Extraocular Movements: Extraocular movements intact.     Conjunctiva/sclera: Conjunctivae normal.     Pupils: Pupils are equal, round, and reactive to light.  Cardiovascular:     Rate and Rhythm: Normal rate and regular rhythm.     Heart sounds: Normal heart sounds.  Pulmonary:     Effort: Pulmonary effort is normal. No respiratory distress.     Breath sounds: Normal breath sounds. No wheezing, rhonchi or rales.  Chest:  Breasts:     Right: Normal. No inverted nipple, mass, nipple discharge or skin change.     Left: Normal. No inverted nipple, mass,  nipple discharge or skin change.    Abdominal:     General: Bowel sounds are normal.     Palpations: Abdomen is soft.  Neurological:     General: No focal deficit present.     Mental Status: He is alert. Mental status is at baseline.     Cranial Nerves: No cranial nerve deficit.     Sensory: No sensory deficit.     Motor: No weakness.     Coordination: Coordination normal.     Gait: Gait normal.           Assessment & Plan:  Daily headache - Plan: Sedimentation rate, CBC with Differential/Platelet, COMPLETE METABOLIC PANEL WITH GFR  Differential diagnosis includes sinus infection, chronic daily headache due to tension headache, increased intracranial pressure, temporal arteritis.  Suspect sinus infection versus chronic tension headache.  Begin prednisone taper pack with Omnicef 300 mg p.o. twice daily for 10 days.  If symptoms or not improving after this I would recommend a CT scan of the head to evaluate for recalcitrant sinusitis versus increased intracranial pressure due to a space-occupying lesion.

## 2020-06-21 LAB — COMPLETE METABOLIC PANEL WITH GFR
AG Ratio: 2 (calc) (ref 1.0–2.5)
ALT: 17 U/L (ref 9–46)
AST: 19 U/L (ref 10–35)
Albumin: 4.2 g/dL (ref 3.6–5.1)
Alkaline phosphatase (APISO): 60 U/L (ref 35–144)
BUN: 23 mg/dL (ref 7–25)
CO2: 29 mmol/L (ref 20–32)
Calcium: 9.3 mg/dL (ref 8.6–10.3)
Chloride: 101 mmol/L (ref 98–110)
Creat: 1.11 mg/dL (ref 0.70–1.11)
GFR, Est African American: 71 mL/min/{1.73_m2} (ref >=60)
GFR, Est Non African American: 61 mL/min/{1.73_m2} (ref >=60)
Globulin: 2.1 g/dL (calc) (ref 1.9–3.7)
Glucose, Bld: 122 mg/dL — ABNORMAL HIGH (ref 65–99)
Potassium: 4.7 mmol/L (ref 3.5–5.3)
Sodium: 135 mmol/L (ref 135–146)
Total Bilirubin: 0.6 mg/dL (ref 0.2–1.2)
Total Protein: 6.3 g/dL (ref 6.1–8.1)

## 2020-06-21 LAB — CBC WITH DIFFERENTIAL/PLATELET
Absolute Monocytes: 640 cells/uL (ref 200–950)
Basophils Absolute: 74 cells/uL (ref 0–200)
Basophils Relative: 0.9 %
Eosinophils Absolute: 271 cells/uL (ref 15–500)
Eosinophils Relative: 3.3 %
HCT: 44.4 % (ref 38.5–50.0)
Hemoglobin: 15 g/dL (ref 13.2–17.1)
Lymphs Abs: 1771 cells/uL (ref 850–3900)
MCH: 29.8 pg (ref 27.0–33.0)
MCHC: 33.8 g/dL (ref 32.0–36.0)
MCV: 88.3 fL (ref 80.0–100.0)
MPV: 11 fL (ref 7.5–12.5)
Monocytes Relative: 7.8 %
Neutro Abs: 5445 cells/uL (ref 1500–7800)
Neutrophils Relative %: 66.4 %
Platelets: 263 10*3/uL (ref 140–400)
RBC: 5.03 10*6/uL (ref 4.20–5.80)
RDW: 12.6 % (ref 11.0–15.0)
Total Lymphocyte: 21.6 %
WBC: 8.2 10*3/uL (ref 3.8–10.8)

## 2020-06-21 LAB — SEDIMENTATION RATE: Sed Rate: 2 mm/h (ref 0–20)

## 2020-06-22 ENCOUNTER — Other Ambulatory Visit: Payer: Self-pay | Admitting: Family Medicine

## 2020-07-15 DIAGNOSIS — M545 Low back pain, unspecified: Secondary | ICD-10-CM | POA: Diagnosis not present

## 2020-08-01 ENCOUNTER — Other Ambulatory Visit: Payer: Self-pay

## 2020-08-01 ENCOUNTER — Encounter: Payer: Self-pay | Admitting: Family Medicine

## 2020-08-01 ENCOUNTER — Ambulatory Visit (INDEPENDENT_AMBULATORY_CARE_PROVIDER_SITE_OTHER): Payer: Medicare Other | Admitting: Family Medicine

## 2020-08-01 VITALS — BP 132/84 | HR 73 | Temp 98.1°F | Ht 74.0 in | Wt 215.0 lb

## 2020-08-01 DIAGNOSIS — R7303 Prediabetes: Secondary | ICD-10-CM | POA: Diagnosis not present

## 2020-08-01 DIAGNOSIS — E785 Hyperlipidemia, unspecified: Secondary | ICD-10-CM

## 2020-08-01 DIAGNOSIS — I10 Essential (primary) hypertension: Secondary | ICD-10-CM

## 2020-08-01 MED ORDER — LISINOPRIL-HYDROCHLOROTHIAZIDE 10-12.5 MG PO TABS
1.0000 | ORAL_TABLET | Freq: Two times a day (BID) | ORAL | 0 refills | Status: DC
Start: 2020-08-01 — End: 2020-11-01

## 2020-08-01 NOTE — Progress Notes (Signed)
Subjective:    Patient ID: Jesse Doyne., male    DOB: 08/22/1934, 86 y.o.   MRN: [id number]  Headaches have resolved.  He denies any chest pain shortness of breath or dyspnea on exertion.  His blood pressure today is adequately controlled.  He states that at home is typically in the 120s to 130s over 60s to 80s.  He is consistently taking lisinopril hydrochlorothiazide twice a day and metoprolol twice a day.  He is also on simvastatin for hyperlipidemia.  He denies any myalgias or right upper quadrant pain.  At his last visit, his blood sugar was elevated however he states that this was a random sugar and that he had not been fasting. Past Medical History:  Diagnosis Date  . Blood in stool   . Hemorrhoids   . Hyperlipidemia   . Hypertension   . Rectal fissure   . Rectal pain    Past Surgical History:  Procedure Laterality Date  . HERNIA REPAIR  2000 (approx)  . lower back surgery  05/1995, 05/1998  . SHOULDER SURGERY  1997   both   Current Outpatient Medications on File Prior to Visit  Medication Sig Dispense Refill  . metoprolol tartrate (LOPRESSOR) 25 MG tablet Take 1 tablet by mouth twice daily 180 tablet 3  . Multiple Vitamins-Minerals (CENTRUM SILVER ADULT 50+ PO) Take 1 tablet by mouth daily.    . simvastatin (ZOCOR) 20 MG tablet TAKE 1 TABLET BY MOUTH AT BEDTIME 90 tablet 0   No current facility-administered medications on file prior to visit.   Allergies  Allergen Reactions  . Doxycycline     GI upset    Social History   Socioeconomic History  . Marital status: Widowed    Spouse name: Not on file  . Number of children: Not on file  . Years of education: Not on file  . Highest education level: Not on file  Occupational History  . Not on file  Tobacco Use  . Smoking status: Never Smoker  . Smokeless tobacco: Never Used  Vaping Use  . Vaping Use: Never used  Substance and Sexual Activity  . Alcohol use: No  . Drug use: No  . Sexual activity: Not on  file  Other Topics Concern  . Not on file  Social History Narrative  . Not on file   Social Determinants of Health   Financial Resource Strain: Not on file  Food Insecurity: Not on file  Transportation Needs: Not on file  Physical Activity: Not on file  Stress: Not on file  Social Connections: Not on file  Intimate Partner Violence: Not on file      Review of Systems  All other systems reviewed and are negative.      Objective:   Physical Exam Vitals reviewed.  Constitutional:      Appearance: Normal appearance. He is normal weight.  HENT:     Head: Normocephalic and atraumatic.     Right Ear: Tympanic membrane and ear canal normal.     Left Ear: Tympanic membrane and ear canal normal.     Nose: Nose normal. No congestion or rhinorrhea.     Mouth/Throat:     Mouth: Mucous membranes are moist.     Pharynx: No oropharyngeal exudate or posterior oropharyngeal erythema.  Eyes:     Extraocular Movements: Extraocular movements intact.     Conjunctiva/sclera: Conjunctivae normal.     Pupils: Pupils are equal, round, and reactive to light.  Cardiovascular:  Rate and Rhythm: Normal rate and regular rhythm.     Heart sounds: Normal heart sounds.  Pulmonary:     Effort: Pulmonary effort is normal. No respiratory distress.     Breath sounds: Normal breath sounds. No wheezing, rhonchi or rales.  Chest:  Breasts:     Right: Normal. No inverted nipple, mass, nipple discharge or skin change.     Left: Normal. No inverted nipple, mass, nipple discharge or skin change.    Abdominal:     General: Bowel sounds are normal.     Palpations: Abdomen is soft.  Neurological:     General: No focal deficit present.     Mental Status: He is alert. Mental status is at baseline.     Cranial Nerves: No cranial nerve deficit.     Sensory: No sensory deficit.     Motor: No weakness.     Coordination: Coordination normal.     Gait: Gait normal.           Assessment & Plan:   Benign essential HTN  Prediabetes  Hyperlipidemia, unspecified hyperlipidemia type  Not sure what to make of his blood sugar given the fact it was not fasting.  It would put him in a prediabetic level however this is a random sugar therefore of asked him to come back fasting at his convenience and we will recheck a BMP and a lipid panel.  Ideally I would like to see his fasting blood sugar below 100 and his LDL cholesterol below 100.  His blood pressure is outstanding.  Immunizations are up-to-date.

## 2020-08-16 ENCOUNTER — Other Ambulatory Visit: Payer: Self-pay

## 2020-08-16 ENCOUNTER — Other Ambulatory Visit: Payer: Medicare Other

## 2020-08-16 DIAGNOSIS — R7303 Prediabetes: Secondary | ICD-10-CM | POA: Diagnosis not present

## 2020-08-16 DIAGNOSIS — E785 Hyperlipidemia, unspecified: Secondary | ICD-10-CM | POA: Diagnosis not present

## 2020-08-16 DIAGNOSIS — I10 Essential (primary) hypertension: Secondary | ICD-10-CM | POA: Diagnosis not present

## 2020-08-17 LAB — BASIC METABOLIC PANEL WITH GFR
BUN: 18 mg/dL (ref 7–25)
CO2: 30 mmol/L (ref 20–32)
Calcium: 9.5 mg/dL (ref 8.6–10.3)
Chloride: 101 mmol/L (ref 98–110)
Creat: 1.03 mg/dL (ref 0.70–1.11)
GFR, Est African American: 77 mL/min/{1.73_m2} (ref >=60)
GFR, Est Non African American: 66 mL/min/{1.73_m2} (ref >=60)
Glucose, Bld: 102 mg/dL — ABNORMAL HIGH (ref 65–99)
Potassium: 4.2 mmol/L (ref 3.5–5.3)
Sodium: 138 mmol/L (ref 135–146)

## 2020-08-17 LAB — LIPID PANEL
Cholesterol: 144 mg/dL (ref <200)
HDL: 56 mg/dL (ref >=40)
LDL Cholesterol (Calc): 71 mg/dL (calc)
Non-HDL Cholesterol (Calc): 88 mg/dL (calc) (ref <130)
Total CHOL/HDL Ratio: 2.6 (calc) (ref <5.0)
Triglycerides: 91 mg/dL (ref <150)

## 2020-08-19 DIAGNOSIS — M545 Low back pain, unspecified: Secondary | ICD-10-CM | POA: Diagnosis not present

## 2020-08-19 DIAGNOSIS — L570 Actinic keratosis: Secondary | ICD-10-CM | POA: Diagnosis not present

## 2020-08-19 DIAGNOSIS — L905 Scar conditions and fibrosis of skin: Secondary | ICD-10-CM | POA: Diagnosis not present

## 2020-08-19 DIAGNOSIS — L819 Disorder of pigmentation, unspecified: Secondary | ICD-10-CM | POA: Diagnosis not present

## 2020-08-19 DIAGNOSIS — L821 Other seborrheic keratosis: Secondary | ICD-10-CM | POA: Diagnosis not present

## 2020-08-19 DIAGNOSIS — Z85828 Personal history of other malignant neoplasm of skin: Secondary | ICD-10-CM | POA: Diagnosis not present

## 2020-09-16 ENCOUNTER — Other Ambulatory Visit: Payer: Self-pay | Admitting: Family Medicine

## 2020-10-26 DIAGNOSIS — R351 Nocturia: Secondary | ICD-10-CM | POA: Diagnosis not present

## 2020-10-26 DIAGNOSIS — N401 Enlarged prostate with lower urinary tract symptoms: Secondary | ICD-10-CM | POA: Diagnosis not present

## 2020-10-26 DIAGNOSIS — N411 Chronic prostatitis: Secondary | ICD-10-CM | POA: Diagnosis not present

## 2020-11-01 ENCOUNTER — Other Ambulatory Visit: Payer: Self-pay | Admitting: Family Medicine

## 2020-12-15 ENCOUNTER — Other Ambulatory Visit: Payer: Self-pay | Admitting: Family Medicine

## 2020-12-15 DIAGNOSIS — L570 Actinic keratosis: Secondary | ICD-10-CM | POA: Diagnosis not present

## 2020-12-15 DIAGNOSIS — L218 Other seborrheic dermatitis: Secondary | ICD-10-CM | POA: Diagnosis not present

## 2020-12-15 DIAGNOSIS — Z85828 Personal history of other malignant neoplasm of skin: Secondary | ICD-10-CM | POA: Diagnosis not present

## 2020-12-15 DIAGNOSIS — L905 Scar conditions and fibrosis of skin: Secondary | ICD-10-CM | POA: Diagnosis not present

## 2020-12-15 DIAGNOSIS — L819 Disorder of pigmentation, unspecified: Secondary | ICD-10-CM | POA: Diagnosis not present

## 2021-01-26 ENCOUNTER — Other Ambulatory Visit: Payer: Self-pay | Admitting: Family Medicine

## 2021-03-19 ENCOUNTER — Other Ambulatory Visit: Payer: Self-pay | Admitting: Family Medicine

## 2021-03-23 ENCOUNTER — Other Ambulatory Visit: Payer: Self-pay | Admitting: Family Medicine

## 2021-03-28 ENCOUNTER — Ambulatory Visit (INDEPENDENT_AMBULATORY_CARE_PROVIDER_SITE_OTHER): Payer: Medicare Other | Admitting: *Deleted

## 2021-03-28 ENCOUNTER — Other Ambulatory Visit: Payer: Self-pay

## 2021-03-28 DIAGNOSIS — Z23 Encounter for immunization: Secondary | ICD-10-CM

## 2021-03-31 ENCOUNTER — Ambulatory Visit: Payer: Medicare Other

## 2021-04-17 DIAGNOSIS — L814 Other melanin hyperpigmentation: Secondary | ICD-10-CM | POA: Diagnosis not present

## 2021-04-17 DIAGNOSIS — L570 Actinic keratosis: Secondary | ICD-10-CM | POA: Diagnosis not present

## 2021-04-17 DIAGNOSIS — L821 Other seborrheic keratosis: Secondary | ICD-10-CM | POA: Diagnosis not present

## 2021-04-21 ENCOUNTER — Ambulatory Visit (INDEPENDENT_AMBULATORY_CARE_PROVIDER_SITE_OTHER): Payer: Medicare Other

## 2021-04-21 ENCOUNTER — Other Ambulatory Visit: Payer: Self-pay

## 2021-04-21 VITALS — Ht 74.0 in | Wt 215.0 lb

## 2021-04-21 DIAGNOSIS — Z0000 Encounter for general adult medical examination without abnormal findings: Secondary | ICD-10-CM

## 2021-04-21 NOTE — Patient Instructions (Signed)
Jesse Fletcher , Thank you for taking time to come for your Medicare Wellness Visit. I appreciate your ongoing commitment to your health goals. Please review the following plan we discussed and let me know if I can assist you in the future.   Screening recommendations/referrals: Colonoscopy: No longer required due to age.  Recommended yearly ophthalmology/optometry visit for glaucoma screening and checkup Recommended yearly dental visit for hygiene and checkup  Vaccinations: Influenza vaccine: Done 03/28/21 Repeat annually  Pneumococcal vaccine: Done 11/10/2010 and 10/30/2013 Tdap vaccine: Due. Repeat in 10 years  Shingles vaccine: Shingrix discussed. Please contact your pharmacy for coverage information.     Covid-19: Done 07/21/2019 and 08/04/2019. Please bring card so chart can be updated.   Advanced directives: Advance directive discussed with you today. Even though you declined this today, please call our office should you change your mind, and we can give you the proper paperwork for you to fill out.   Conditions/risks identified: KEEP UP THE GOOD WORK!!!  Next appointment: Follow up in one year for your annual wellness visit. 2023.  Preventive Care 70 Years and Older, Male  Preventive care refers to lifestyle choices and visits with your health care provider that can promote health and wellness. What does preventive care include? A yearly physical exam. This is also called an annual well check. Dental exams once or twice a year. Routine eye exams. Ask your health care provider how often you should have your eyes checked. Personal lifestyle choices, including: Daily care of your teeth and gums. Regular physical activity. Eating a healthy diet. Avoiding tobacco and drug use. Limiting alcohol use. Practicing safe sex. Taking low doses of aspirin every day. Taking vitamin and mineral supplements as recommended by your health care provider. What happens during an annual well  check? The services and screenings done by your health care provider during your annual well check will depend on your age, overall health, lifestyle risk factors, and family history of disease. Counseling  Your health care provider may ask you questions about your: Alcohol use. Tobacco use. Drug use. Emotional well-being. Home and relationship well-being. Sexual activity. Eating habits. History of falls. Memory and ability to understand (cognition). Work and work Statistician. Screening  You may have the following tests or measurements: Height, weight, and BMI. Blood pressure. Lipid and cholesterol levels. These may be checked every 5 years, or more frequently if you are over 19 years old. Skin check. Lung cancer screening. You may have this screening every year starting at age 30 if you have a 30-pack-year history of smoking and currently smoke or have quit within the past 15 years. Fecal occult blood test (FOBT) of the stool. You may have this test every year starting at age 58. Flexible sigmoidoscopy or colonoscopy. You may have a sigmoidoscopy every 5 years or a colonoscopy every 10 years starting at age 76. Prostate cancer screening. Recommendations will vary depending on your family history and other risks. Hepatitis C blood test. Hepatitis B blood test. Sexually transmitted disease (STD) testing. Diabetes screening. This is done by checking your blood sugar (glucose) after you have not eaten for a while (fasting). You may have this done every 1-3 years. Abdominal aortic aneurysm (AAA) screening. You may need this if you are a current or former smoker. Osteoporosis. You may be screened starting at age 19 if you are at high risk. Talk with your health care provider about your test results, treatment options, and if necessary, the need for more tests. Vaccines  Your health care provider may recommend certain vaccines, such as: Influenza vaccine. This is recommended every  year. Tetanus, diphtheria, and acellular pertussis (Tdap, Td) vaccine. You may need a Td booster every 10 years. Zoster vaccine. You may need this after age 56. Pneumococcal 13-valent conjugate (PCV13) vaccine. One dose is recommended after age 83. Pneumococcal polysaccharide (PPSV23) vaccine. One dose is recommended after age 62. Talk to your health care provider about which screenings and vaccines you need and how often you need them. This information is not intended to replace advice given to you by your health care provider. Make sure you discuss any questions you have with your health care provider. Document Released: 07/15/2015 Document Revised: 03/07/2016 Document Reviewed: 04/19/2015 Elsevier Interactive Patient Education  2017 [place] Prevention in the Home Falls can cause injuries. They can happen to people of all ages. There are many things you can do to make your home safe and to help prevent falls. What can I do on the outside of my home? Regularly fix the edges of walkways and driveways and fix any cracks. Remove anything that might make you trip as you walk through a door, such as a raised step or threshold. Trim any bushes or trees on the path to your home. Use bright outdoor lighting. Clear any walking paths of anything that might make someone trip, such as rocks or tools. Regularly check to see if handrails are loose or broken. Make sure that both sides of any steps have handrails. Any raised decks and porches should have guardrails on the edges. Have any leaves, snow, or ice cleared regularly. Use sand or salt on walking paths during winter. Clean up any spills in your garage right away. This includes oil or grease spills. What can I do in the bathroom? Use night lights. Install grab bars by the toilet and in the tub and shower. Do not use towel bars as grab bars. Use non-skid mats or decals in the tub or shower. If you need to sit down in the shower, use a  plastic, non-slip stool. Keep the floor dry. Clean up any water that spills on the floor as soon as it happens. Remove soap buildup in the tub or shower regularly. Attach bath mats securely with double-sided non-slip rug tape. Do not have throw rugs and other things on the floor that can make you trip. What can I do in the bedroom? Use night lights. Make sure that you have a light by your bed that is easy to reach. Do not use any sheets or blankets that are too big for your bed. They should not hang down onto the floor. Have a firm chair that has side arms. You can use this for support while you get dressed. Do not have throw rugs and other things on the floor that can make you trip. What can I do in the kitchen? Clean up any spills right away. Avoid walking on wet floors. Keep items that you use a lot in easy-to-reach places. If you need to reach something above you, use a strong step stool that has a grab bar. Keep electrical cords out of the way. Do not use floor polish or wax that makes floors slippery. If you must use wax, use non-skid floor wax. Do not have throw rugs and other things on the floor that can make you trip. What can I do with my stairs? Do not leave any items on the stairs. Make sure that there are  handrails on both sides of the stairs and use them. Fix handrails that are broken or loose. Make sure that handrails are as long as the stairways. Check any carpeting to make sure that it is firmly attached to the stairs. Fix any carpet that is loose or worn. Avoid having throw rugs at the top or bottom of the stairs. If you do have throw rugs, attach them to the floor with carpet tape. Make sure that you have a light switch at the top of the stairs and the bottom of the stairs. If you do not have them, ask someone to add them for you. What else can I do to help prevent falls? Wear shoes that: Do not have high heels. Have rubber bottoms. Are comfortable and fit you  well. Are closed at the toe. Do not wear sandals. If you use a stepladder: Make sure that it is fully opened. Do not climb a closed stepladder. Make sure that both sides of the stepladder are locked into place. Ask someone to hold it for you, if possible. Clearly mark and make sure that you can see: Any grab bars or handrails. First and last steps. Where the edge of each step is. Use tools that help you move around (mobility aids) if they are needed. These include: Canes. Walkers. Scooters. Crutches. Turn on the lights when you go into a dark area. Replace any light bulbs as soon as they burn out. Set up your furniture so you have a clear path. Avoid moving your furniture around. If any of your floors are uneven, fix them. If there are any pets around you, be aware of where they are. Review your medicines with your doctor. Some medicines can make you feel dizzy. This can increase your chance of falling. Ask your doctor what other things that you can do to help prevent falls. This information is not intended to replace advice given to you by your health care provider. Make sure you discuss any questions you have with your health care provider. Document Released: 04/14/2009 Document Revised: 11/24/2015 Document Reviewed: 07/23/2014 Elsevier Interactive Patient Education  2017 [employer].

## 2021-04-21 NOTE — Progress Notes (Signed)
Subjective:   Jesse Fletcher. is a 86 y.o. male who presents for Medicare Annual/Subsequent preventive examination. Virtual Visit via Telephone Note  I connected with  Jesse Fletcher. on 04/21/21 at 11:15 AM EDT by telephone and verified that I am speaking with the correct person using two identifiers.  Location: Patient: Home Provider: [facility] Persons participating in the virtual visit: patient/Nurse Health Advisor   I discussed the limitations, risks, security and privacy concerns of performing an evaluation and management service by telephone and the availability of in person appointments. The patient expressed understanding and agreed to proceed.  Interactive audio and video telecommunications were attempted between this nurse and patient, however failed, due to patient having technical difficulties OR patient did not have access to video capability.  We continued and completed visit with audio only.  Some vital signs may be absent or patient reported.   Chriss Driver, LPN  Review of Systems     Cardiac Risk Factors include: advanced age (>38men, >33 women);hypertension;sedentary lifestyle;male gender     Objective:    Today's Vitals   04/21/21 1111  Weight: 215 lb (97.5 kg)  Height: 6\' 2"  (1.88 m)   Body mass index is 27.6 kg/m.  Advanced Directives 04/21/2021 03/10/2020  Does Patient Have a Medical Advance Directive? No Yes  Type of Advance Directive - [place];Living will  Would patient like information on creating a medical advance directive? No - Patient declined -    Current Medications (verified) Outpatient Encounter Medications as of 04/21/2021  Medication Sig   lisinopril-hydrochlorothiazide (ZESTORETIC) 10-12.5 MG tablet Take 1 tablet by mouth twice daily   metoprolol tartrate (LOPRESSOR) 25 MG tablet Take 1 tablet by mouth twice daily   Multiple Vitamins-Minerals (CENTRUM SILVER ADULT 50+ PO) Take 1 tablet by mouth daily.    simvastatin (ZOCOR) 20 MG tablet TAKE 1 TABLET BY MOUTH BY MOUTH AT BEDTIME.OFFICE VISIT NEEDED FOR MORE REFILLS   No facility-administered encounter medications on file as of 04/21/2021.    Allergies (verified) Doxycycline   History: Past Medical History:  Diagnosis Date   Blood in stool    Hemorrhoids    Hyperlipidemia    Hypertension    Rectal fissure    Rectal pain    Past Surgical History:  Procedure Laterality Date   HERNIA REPAIR  2000 (approx)   lower back surgery  05/1995, 05/1998   SHOULDER SURGERY  1997   both   No family history on file. Social History   Socioeconomic History   Marital status: Widowed    Spouse name: Not on file   Number of children: Not on file   Years of education: Not on file   Highest education level: Not on file  Occupational History   Not on file  Tobacco Use   Smoking status: Never   Smokeless tobacco: Never  Vaping Use   Vaping Use: Never used  Substance and Sexual Activity   Alcohol use: No   Drug use: No   Sexual activity: Not on file  Other Topics Concern   Not on file  Social History Narrative   Not on file   Social Determinants of Health   Financial Resource Strain: Low Risk    Difficulty of Paying Living Expenses: Not hard at all  Food Insecurity: No Food Insecurity   Worried About Economy in the Last Year: Never true   [place] in the Last Year: Never  true  Transportation Needs: No Transportation Needs   Lack of Transportation (Medical): No   Lack of Transportation (Non-Medical): No  Physical Activity: Sufficiently Active   Days of Exercise per Week: 5 days   Minutes of Exercise per Session: 50 min  Stress: No Stress Concern Present   Feeling of Stress : Not at all  Social Connections: Moderately Integrated   Frequency of Communication with Friends and Family: More than three times a week   Frequency of Social Gatherings with Friends and Family: More than three times a week   Attends  Religious Services: More than 4 times per year   Active Member of [employer] or Organizations: Yes   Attends Archivist Meetings: More than 4 times per year   Marital Status: Widowed    Tobacco Counseling Counseling given: Not Answered   Clinical Intake:  Pre-visit preparation completed: Yes  Pain : No/denies pain     BMI - recorded: 27.6 Nutritional Status: BMI 25 -29 Overweight Nutritional Risks: None Diabetes: No  How often do you need to have someone help you when you read instructions, pamphlets, or other written materials from your doctor or pharmacy?: 1 - Never  Diabetic?np  Interpreter Needed?: No  Information entered by :: MJ Fatina Sprankle, LPN   Activities of Daily Living In your present state of health, do you have any difficulty performing the following activities: 04/21/2021  Hearing? N  Vision? N  Difficulty concentrating or making decisions? N  Walking or climbing stairs? N  Dressing or bathing? N  Doing errands, shopping? N  Preparing Food and eating ? N  Using the Toilet? N  In the past six months, have you accidently leaked urine? N  Do you have problems with loss of bowel control? N  Managing your Medications? N  Managing your Finances? N  Housekeeping or managing your Housekeeping? N  Some recent data might be hidden    Patient Care Team: Susy Frizzle, MD as PCP - General (Family Medicine)  Indicate any recent Medical Services you may have received from other than [facility] providers in the past year (date may be approximate).     Assessment:   This is a routine wellness examination for Jesse Fletcher.  Hearing/Vision screen Hearing Screening - Comments:: No hearing issues.  Vision Screening - Comments:: Readers. Doesn't do eye exams.  Dietary issues and exercise activities discussed: Current Exercise Habits: Home exercise routine, Type of exercise: walking;strength training/weights, Time (Minutes): 45, Frequency (Times/Week): 5, Weekly  Exercise (Minutes/Week): 225, Intensity: Mild, Exercise limited by: cardiac condition(s)   Goals Addressed   None    Depression Screen PHQ 2/9 Scores 04/21/2021 03/10/2020 03/06/2019 03/06/2019 03/26/2018 10/24/2017 04/07/2014  PHQ - 2 Score 0 0 0 0 0 0 0    Fall Risk Fall Risk  04/21/2021 03/10/2020 03/06/2019 03/06/2019 03/26/2018  Falls in the past year? 0 0 0 0 No  Number falls in past yr: 0 0 0 0 -  Injury with Fall? 0 0 0 0 -  Risk for fall due to : No Fall Risks - - - -  Follow up Falls prevention discussed - - Falls prevention discussed -    FALL RISK PREVENTION PERTAINING TO THE HOME:  Any stairs in or around the home? Yes  If so, are there any without handrails? No  Home free of loose throw rugs in walkways, pet beds, electrical cords, etc? Yes  Adequate lighting in your home to reduce risk of falls? Yes   ASSISTIVE  DEVICES UTILIZED TO PREVENT FALLS:  Life alert? Yes  Use of a cane, walker or w/c? No  Grab bars in the bathroom? No  Shower chair or bench in shower? No  Elevated toilet seat or a handicapped toilet? No   TIMED UP AND GO:  Was the test performed? No . Phone visit.   Cognitive Function:     6CIT Screen 04/21/2021 03/10/2020  What Year? - 0 points  What month? - 0 points  What time? 0 points 0 points  Count back from 20 0 points 0 points  Months in reverse 0 points 0 points  Repeat phrase 2 points 0 points  Total Score - 0    Immunizations Immunization History  Administered Date(s) Administered   Fluad Quad(high Dose 65+) 03/06/2019, 03/10/2020, 03/28/2021   Influenza, High Dose Seasonal PF 04/19/2017   Influenza,inj,Quad PF,6+ Mos 04/08/2013, 03/29/2014, 03/25/2015, 04/13/2016, 05/01/2018   PFIZER(Purple Top)SARS-COV-2 Vaccination 07/21/2019, 08/10/2019   Pneumococcal Conjugate-13 10/30/2013   Pneumococcal Polysaccharide-23 11/10/2010    TDAP status: Due, Education has been provided regarding the importance of this vaccine. Advised may receive this  vaccine at local pharmacy or Health Dept. Aware to provide a copy of the vaccination record if obtained from local pharmacy or Health Dept. Verbalized acceptance and understanding.  Flu Vaccine status: Up to date  Pneumococcal vaccine status: Up to date  Covid-19 vaccine status: Information provided on how to obtain vaccines.   Qualifies for Shingles Vaccine? Yes   Zostavax completed No   Shingrix Completed?: No.    Education has been provided regarding the importance of this vaccine. Patient has been advised to call insurance company to determine out of pocket expense if they have not yet received this vaccine. Advised may also receive vaccine at local pharmacy or Health Dept. Verbalized acceptance and understanding.  Screening Tests Health Maintenance  Topic Date Due   FOOT EXAM  Never done   OPHTHALMOLOGY EXAM  Never done   TETANUS/TDAP  Never done   Zoster Vaccines- Shingrix (1 of 2) Never done   COVID-19 Vaccine (3 - Booster for Pfizer series) 10/05/2019   HEMOGLOBIN A1C  09/01/2020   Pneumonia Vaccine 57+ Years old  Completed   INFLUENZA VACCINE  Completed   HPV VACCINES  Aged Out    Health Maintenance  Health Maintenance Due  Topic Date Due   FOOT EXAM  Never done   OPHTHALMOLOGY EXAM  Never done   TETANUS/TDAP  Never done   Zoster Vaccines- Shingrix (1 of 2) Never done   COVID-19 Vaccine (3 - Booster for Pfizer series) 10/05/2019   HEMOGLOBIN A1C  09/01/2020    Colorectal cancer screening: No longer required.   Lung Cancer Screening: (Low Dose CT Chest recommended if Age 75-80 years, 30 pack-year currently smoking OR have quit w/in 15years.) does not qualify.    Additional Screening:  Hepatitis C Screening: does not qualify.  Vision Screening: Recommended annual ophthalmology exams for early detection of glaucoma and other disorders of the eye. Is the patient up to date with their annual eye exam?  No  Who is the provider or what is the name of the office in  which the patient attends annual eye exams? N/A If pt is not established with a provider, would they like to be referred to a provider to establish care? No .   Dental Screening: Recommended annual dental exams for proper oral hygiene  Community Resource Referral / Chronic Care Management: CRR required this visit?  No  CCM required this visit?  No      Plan:     I have personally reviewed and noted the following in the patient's chart:   Medical and social history Use of alcohol, tobacco or illicit drugs  Current medications and supplements including opioid prescriptions. Patient is not currently taking opioid prescriptions. Functional ability and status Nutritional status Physical activity Advanced directives List of other physicians Hospitalizations, surgeries, and ER visits in previous 12 months Vitals Screenings to include cognitive, depression, and falls Referrals and appointments  In addition, I have reviewed and discussed with patient certain preventive protocols, quality metrics, and best practice recommendations. A written personalized care plan for preventive services as well as general preventive health recommendations were provided to patient.     Chriss Driver, LPN   [id number]   Nurse Notes: Pt state he is doing well. Has already walked 45 minutes this morning and worked in yard. Pt states he has had 4 Covid vaccines. Asked pt to bring immunization card to next visit, so we can document them. Pt verbalized understanding. Declines eye exam referral.

## 2021-05-01 ENCOUNTER — Other Ambulatory Visit: Payer: Self-pay | Admitting: Family Medicine

## 2021-06-14 ENCOUNTER — Other Ambulatory Visit: Payer: Self-pay | Admitting: Family Medicine

## 2021-06-22 ENCOUNTER — Telehealth: Payer: Self-pay

## 2021-06-22 NOTE — Telephone Encounter (Signed)
error 

## 2021-06-22 NOTE — Telephone Encounter (Signed)
Error

## 2021-06-22 NOTE — Telephone Encounter (Signed)
Pt advised he needs OV to evaluate his symptoms. Pt scheduled for OV tomorrow.

## 2021-06-22 NOTE — Telephone Encounter (Signed)
Pt called in stating that he had a prescription for amoxicillin (AMOXIL) 875 MG tablet from his previous provider at this office and would like to know if he could get this refilled for a cold that he has. Please advise.  Cb#: [phone]

## 2021-06-23 ENCOUNTER — Ambulatory Visit (INDEPENDENT_AMBULATORY_CARE_PROVIDER_SITE_OTHER): Payer: Medicare Other | Admitting: Nurse Practitioner

## 2021-06-23 ENCOUNTER — Encounter: Payer: Self-pay | Admitting: Nurse Practitioner

## 2021-06-23 VITALS — BP 140/80 | HR 70 | Temp 97.6°F | Resp 16 | Ht 74.0 in | Wt 214.0 lb

## 2021-06-23 DIAGNOSIS — J069 Acute upper respiratory infection, unspecified: Secondary | ICD-10-CM

## 2021-06-23 DIAGNOSIS — H6123 Impacted cerumen, bilateral: Secondary | ICD-10-CM | POA: Diagnosis not present

## 2021-06-23 NOTE — Progress Notes (Signed)
Subjective:    Patient ID: Jesse Fletcher., male    DOB: 02-11-1935, 86 y.o.   MRN: [id number]  HPI: Jesse Fletcher. is a 86 y.o. male presenting for nasal congestion.  Chief Complaint  Patient presents with   Nasal Congestion   UPPER RESPIRATORY TRACT INFECTION Onset: 4-5 days ao COVID-19 testing history: tested negative last night  COVID-19 vaccination status: has had 4 vaccines with most recent about 4 months ago Fever: no Cough: no Shortness of breath: no Wheezing: no Chest pain: no Chest tightness: no Chest congestion: no Nasal congestion: yes Runny nose: yes Post nasal drip: yes Sneezing: yes Sore throat: yes Swollen glands: no Sinus pressure: no Headache: no Face pain: no Toothache: no Ear pain: no  Ear pressure:  yes left ear   Eyes red/itching:no Eye drainage/crusting: yes; watery Nausea: no  Vomiting: no Diarrhea: no  Change in appetite: no  Loss of taste/smell: no  Rash: no Fatigue: no Sick contacts: yes; saw family last week who have COVID-19 now Strep contacts: no  Context: worse Recurrent sinusitis: no Treatments attempted: advil/tylenol, coricidin Relief with OTC medications: yes   Allergies  Allergen Reactions   Doxycycline     GI upset    Outpatient Encounter Medications as of 06/23/2021  Medication Sig   lisinopril-hydrochlorothiazide (ZESTORETIC) 10-12.5 MG tablet Take 1 tablet by mouth twice daily   metoprolol tartrate (LOPRESSOR) 25 MG tablet Take 1 tablet by mouth twice daily   Multiple Vitamins-Minerals (CENTRUM SILVER ADULT 50+ PO) Take 1 tablet by mouth daily.   simvastatin (ZOCOR) 20 MG tablet TAKE 1 TABLET BY MOUTH BY MOUTH AT BEDTIME.OFFICE VISIT NEEDED FOR MORE REFILLS   No facility-administered encounter medications on file as of 06/23/2021.    Patient Active Problem List   Diagnosis Date Noted   Ascending aorta dilation ([place]) 11/06/2018   Agatston coronary artery calcium score between 200 and 399 11/06/2018    Chest pain 06/17/2017   Dyspnea on exertion 06/17/2017   Rectal bleeding 11/25/2013   Hyperlipidemia    Hypertension    Hemorrhoids 05/21/2011   Anal fissure 05/21/2011    Past Medical History:  Diagnosis Date   Blood in stool    Hemorrhoids    Hyperlipidemia    Hypertension    Rectal fissure    Rectal pain     Relevant past medical, surgical, family and social history reviewed and updated as indicated. Interim medical history since our last visit reviewed.  Review of Systems Per HPI unless specifically indicated above     Objective:    BP 140/80    Pulse 70    Temp 97.6 F (36.4 C) (Temporal)    Resp 16    Ht 6\' 2"  (1.88 m)    Wt 214 lb (97.1 kg)    SpO2 96%    BMI 27.48 kg/m   Wt Readings from Last 3 Encounters:  06/23/21 214 lb (97.1 kg)  04/21/21 215 lb (97.5 kg)  08/01/20 215 lb (97.5 kg)    Physical Exam Vitals and nursing note reviewed.  Constitutional:      General: He is not in acute distress.    Appearance: He is not toxic-appearing.  HENT:     Head: Normocephalic and atraumatic.     Right Ear: There is impacted cerumen.     Left Ear: There is impacted cerumen.     Nose: Congestion present. No rhinorrhea.     Mouth/Throat:     Mouth:  Mucous membranes are moist.     Pharynx: Oropharynx is clear. Posterior oropharyngeal erythema present. No oropharyngeal exudate.  Eyes:     General: No scleral icterus.    Extraocular Movements: Extraocular movements intact.  Cardiovascular:     Heart sounds: Normal heart sounds. No murmur heard. Pulmonary:     Effort: Pulmonary effort is normal. No respiratory distress.     Breath sounds: Normal breath sounds. No wheezing, rhonchi or rales.  Musculoskeletal:     Cervical back: Normal range of motion.  Lymphadenopathy:     Cervical: No cervical adenopathy.  Skin:    General: Skin is warm and dry.     Coloration: Skin is not jaundiced or pale.     Findings: No erythema.  Neurological:     Mental Status: He is  alert and oriented to person, place, and time.     Motor: No weakness.     Gait: Gait normal.  Psychiatric:        Mood and Affect: Mood normal.        Behavior: Behavior normal.        Thought Content: Thought content normal.        Judgment: Judgment normal.      Assessment & Plan:  1. Viral URI Acute x 5 days.  Reassured patient that symptoms and exam findings are most consistent with a viral upper respiratory infection and explained lack of efficacy of antibiotics against viruses.  Discussed expected course and features suggestive of secondary bacterial infection.  Continue supportive care. Increase fluid intake with water or electrolyte solution like pedialyte. Encouraged acetaminophen as needed for fever/pain. Encouraged salt water gargling, chloraseptic spray and throat lozenges. Encouraged OTC guaifenesin. Encouraged saline sinus flushes and/or neti with humidified air.   Discussed specifically that azithromycin is not indicated.  He does not appear to have bacterial sinus infection or any pneumonia today.  Follow up if symptoms persist more 7 days with no improvement or if symptoms worsen.   2. Bilateral impacted cerumen Ears flushed with combination of normal saline and hydrogen peroxide.  Cerumen was able to be removed bilaterally.  Patient tolerated well.  - Ear Lavage     Follow up plan: Return if symptoms worsen or fail to improve.

## 2021-07-18 DIAGNOSIS — L570 Actinic keratosis: Secondary | ICD-10-CM | POA: Diagnosis not present

## 2021-07-18 DIAGNOSIS — L720 Epidermal cyst: Secondary | ICD-10-CM | POA: Diagnosis not present

## 2021-07-18 DIAGNOSIS — L218 Other seborrheic dermatitis: Secondary | ICD-10-CM | POA: Diagnosis not present

## 2021-07-26 ENCOUNTER — Other Ambulatory Visit: Payer: Self-pay

## 2021-07-26 MED ORDER — LISINOPRIL-HYDROCHLOROTHIAZIDE 10-12.5 MG PO TABS: 1.0000 | ORAL_TABLET | Freq: Two times a day (BID) | ORAL | 3 refills | Status: DC

## 2021-08-02 ENCOUNTER — Other Ambulatory Visit: Payer: Self-pay

## 2021-08-02 DIAGNOSIS — E7800 Pure hypercholesterolemia, unspecified: Secondary | ICD-10-CM

## 2021-08-02 DIAGNOSIS — I10 Essential (primary) hypertension: Secondary | ICD-10-CM

## 2021-08-03 ENCOUNTER — Other Ambulatory Visit: Payer: Self-pay

## 2021-08-03 ENCOUNTER — Other Ambulatory Visit: Payer: Medicare Other

## 2021-08-03 DIAGNOSIS — E7800 Pure hypercholesterolemia, unspecified: Secondary | ICD-10-CM

## 2021-08-03 DIAGNOSIS — I10 Essential (primary) hypertension: Secondary | ICD-10-CM | POA: Diagnosis not present

## 2021-08-03 LAB — CBC WITH DIFFERENTIAL/PLATELET
Absolute Monocytes: 496 cells/uL (ref 200–950)
Basophils Absolute: 77 cells/uL (ref 0–200)
Basophils Relative: 1.3 %
Eosinophils Absolute: 307 cells/uL (ref 15–500)
Eosinophils Relative: 5.2 %
HCT: 45.4 % (ref 38.5–50.0)
Hemoglobin: 15 g/dL (ref 13.2–17.1)
Lymphs Abs: 1847 cells/uL (ref 850–3900)
MCH: 29.3 pg (ref 27.0–33.0)
MCHC: 33 g/dL (ref 32.0–36.0)
MCV: 88.7 fL (ref 80.0–100.0)
MPV: 10 fL (ref 7.5–12.5)
Monocytes Relative: 8.4 %
Neutro Abs: 3174 cells/uL (ref 1500–7800)
Neutrophils Relative %: 53.8 %
Platelets: 310 10*3/uL (ref 140–400)
RBC: 5.12 10*6/uL (ref 4.20–5.80)
RDW: 12.5 % (ref 11.0–15.0)
Total Lymphocyte: 31.3 %
WBC: 5.9 10*3/uL (ref 3.8–10.8)

## 2021-08-03 LAB — LIPID PANEL
Cholesterol: 142 mg/dL (ref <200)
HDL: 55 mg/dL (ref >=40)
LDL Cholesterol (Calc): 70 mg/dL (calc)
Non-HDL Cholesterol (Calc): 87 mg/dL (calc) (ref <130)
Total CHOL/HDL Ratio: 2.6 (calc) (ref <5.0)
Triglycerides: 91 mg/dL (ref <150)

## 2021-08-03 LAB — COMPREHENSIVE METABOLIC PANEL
AG Ratio: 1.8 (calc) (ref 1.0–2.5)
ALT: 16 U/L (ref 9–46)
AST: 19 U/L (ref 10–35)
Albumin: 4.1 g/dL (ref 3.6–5.1)
Alkaline phosphatase (APISO): 66 U/L (ref 35–144)
BUN: 18 mg/dL (ref 7–25)
CO2: 30 mmol/L (ref 20–32)
Calcium: 9.5 mg/dL (ref 8.6–10.3)
Chloride: 97 mmol/L — ABNORMAL LOW (ref 98–110)
Creat: 1.11 mg/dL (ref 0.70–1.22)
Globulin: 2.3 g/dL (calc) (ref 1.9–3.7)
Glucose, Bld: 104 mg/dL — ABNORMAL HIGH (ref 65–99)
Potassium: 4.4 mmol/L (ref 3.5–5.3)
Sodium: 134 mmol/L — ABNORMAL LOW (ref 135–146)
Total Bilirubin: 0.8 mg/dL (ref 0.2–1.2)
Total Protein: 6.4 g/dL (ref 6.1–8.1)

## 2021-08-10 ENCOUNTER — Other Ambulatory Visit: Payer: Self-pay

## 2021-08-10 ENCOUNTER — Encounter: Payer: Self-pay | Admitting: Family Medicine

## 2021-08-10 ENCOUNTER — Ambulatory Visit (INDEPENDENT_AMBULATORY_CARE_PROVIDER_SITE_OTHER): Payer: Medicare Other | Admitting: Family Medicine

## 2021-08-10 VITALS — BP 138/82 | HR 66 | Temp 97.3°F | Resp 18 | Ht 74.0 in | Wt 217.0 lb

## 2021-08-10 DIAGNOSIS — Z0000 Encounter for general adult medical examination without abnormal findings: Secondary | ICD-10-CM | POA: Diagnosis not present

## 2021-08-10 DIAGNOSIS — I10 Essential (primary) hypertension: Secondary | ICD-10-CM | POA: Diagnosis not present

## 2021-08-10 DIAGNOSIS — M7062 Trochanteric bursitis, left hip: Secondary | ICD-10-CM

## 2021-08-10 NOTE — Progress Notes (Signed)
`   Subjective:    Patient ID: Jesse Fletcher., male    DOB: [birth year], 87 y.o.   MRN: [id number]  HPI Patient is a very pleasant 87 year old Caucasian gentleman who presents today for complete physical exam.  Due to his age, he does not require prostate cancer screening or colon cancer screening.  His immunizations are up-to-date except for Shingrix and the most recent COVID booster.  We discussed these both today and I recommended him to him.  He denies any falls or memory loss or depression.  He is still active and walking every day.  He is physically fit.  His blood pressure is excellent and his lab work listed below is outstanding.  He does complain of some pain over the greater trochanter in his left hip.  He is tender to palpation of the greater trochanter.  He is requesting a cortisone injection for bursitis in his hip.  He reports pain with walking.  Hurts to sleep on his side at night. Immunization History  Administered Date(s) Administered   Fluad Quad(high Dose 65+) 03/06/2019, 03/10/2020, 03/28/2021   Influenza, High Dose Seasonal PF 04/19/2017   Influenza,inj,Quad PF,6+ Mos 04/08/2013, 03/29/2014, 03/25/2015, 04/13/2016, 05/01/2018   PFIZER(Purple Top)SARS-COV-2 Vaccination 07/21/2019, 08/10/2019   Pneumococcal Conjugate-13 10/30/2013   Pneumococcal Polysaccharide-23 11/10/2010     Appointment on 08/03/2021  Component Date Value Ref Range Status   WBC 08/03/2021 5.9  3.8 - 10.8 Thousand/uL Final   RBC 08/03/2021 5.12  4.20 - 5.80 Million/uL Final   Hemoglobin 08/03/2021 15.0  13.2 - 17.1 g/dL Final   HCT 08/03/2021 45.4  38.5 - 50.0 % Final   MCV 08/03/2021 88.7  80.0 - 100.0 fL Final   MCH 08/03/2021 29.3  27.0 - 33.0 pg Final   MCHC 08/03/2021 33.0  32.0 - 36.0 g/dL Final   RDW 08/03/2021 12.5  11.0 - 15.0 % Final   Platelets 08/03/2021 310  140 - 400 Thousand/uL Final   MPV 08/03/2021 10.0  7.5 - 12.5 fL Final   Neutro Abs 08/03/2021 3,174  1,500 - 7,800 cells/uL  Final   Lymphs Abs 08/03/2021 1,847  850 - 3,900 cells/uL Final   Absolute Monocytes 08/03/2021 496  200 - 950 cells/uL Final   Eosinophils Absolute 08/03/2021 307  15 - 500 cells/uL Final   Basophils Absolute 08/03/2021 77  0 - 200 cells/uL Final   Neutrophils Relative % 08/03/2021 53.8  % Final   Total Lymphocyte 08/03/2021 31.3  % Final   Monocytes Relative 08/03/2021 8.4  % Final   Eosinophils Relative 08/03/2021 5.2  % Final   Basophils Relative 08/03/2021 1.3  % Final   Glucose, Bld 08/03/2021 104 (H)  65 - 99 mg/dL Final   Comment: .            Fasting reference interval . For someone without known diabetes, a glucose value between 100 and 125 mg/dL is consistent with prediabetes and should be confirmed with a follow-up test. .    BUN 08/03/2021 18  7 - 25 mg/dL Final   Creat 08/03/2021 1.11  0.70 - 1.22 mg/dL Final   BUN/Creatinine Ratio 93/81/0175 NOT APPLICABLE  6 - 22 (calc) Final   Sodium 08/03/2021 134 (L)  135 - 146 mmol/L Final   Potassium 08/03/2021 4.4  3.5 - 5.3 mmol/L Final   Chloride 08/03/2021 97 (L)  98 - 110 mmol/L Final   CO2 08/03/2021 30  20 - 32 mmol/L Final   Calcium 08/03/2021 9.5  8.6 - 10.3 mg/dL Final   Total Protein 08/03/2021 6.4  6.1 - 8.1 g/dL Final   Albumin 08/03/2021 4.1  3.6 - 5.1 g/dL Final   Globulin 08/03/2021 2.3  1.9 - 3.7 g/dL (calc) Final   AG Ratio 08/03/2021 1.8  1.0 - 2.5 (calc) Final   Total Bilirubin 08/03/2021 0.8  0.2 - 1.2 mg/dL Final   Alkaline phosphatase (APISO) 08/03/2021 66  35 - 144 U/L Final   AST 08/03/2021 19  10 - 35 U/L Final   ALT 08/03/2021 16  9 - 46 U/L Final   Cholesterol 08/03/2021 142  <200 mg/dL Final   HDL 08/03/2021 55  > OR = 40 mg/dL Final   Triglycerides 08/03/2021 91  <150 mg/dL Final   LDL Cholesterol (Calc) 08/03/2021 70  mg/dL (calc) Final   Comment: Reference range: <100 . Desirable range <100 mg/dL for primary prevention;   <70 mg/dL for patients with CHD or diabetic patients  with > or =  2 CHD risk factors. Marland Kitchen LDL-C is now calculated using the Martin-Hopkins  calculation, which is a validated novel method providing  better accuracy than the Friedewald equation in the  estimation of LDL-C.  Cresenciano Genre et al. Annamaria Helling. 0867;619(50): 2061-2068  (http://education.[facility].com/faq/FAQ164)    Total CHOL/HDL Ratio 08/03/2021 2.6  <5.0 (calc) Final   Non-HDL Cholesterol (Calc) 08/03/2021 87  <130 mg/dL (calc) Final   Comment: For patients with diabetes plus 1 major ASCVD risk  factor, treating to a non-HDL-C goal of <100 mg/dL  (LDL-C of <70 mg/dL) is considered a therapeutic  option.     Past Medical History:  Diagnosis Date   Blood in stool    Hemorrhoids    Hyperlipidemia    Hypertension    Rectal fissure    Rectal pain    Past Surgical History:  Procedure Laterality Date   HERNIA REPAIR  2000 (approx)   lower back surgery  05/1995, 05/1998   SHOULDER SURGERY  1997   both   Current Outpatient Medications on File Prior to Visit  Medication Sig Dispense Refill   lisinopril-hydrochlorothiazide (ZESTORETIC) 10-12.5 MG tablet Take 1 tablet by mouth 2 (two) times daily. 180 tablet 3   metoprolol tartrate (LOPRESSOR) 25 MG tablet Take 1 tablet by mouth twice daily 180 tablet 1   Multiple Vitamins-Minerals (CENTRUM SILVER ADULT 50+ PO) Take 1 tablet by mouth daily.     simvastatin (ZOCOR) 20 MG tablet TAKE 1 TABLET BY MOUTH BY MOUTH AT BEDTIME.OFFICE VISIT NEEDED FOR MORE REFILLS 90 tablet 0   No current facility-administered medications on file prior to visit.   Allergies  Allergen Reactions   Doxycycline     GI upset    Social History   Socioeconomic History   Marital status: Widowed    Spouse name: Not on file   Number of children: Not on file   Years of education: Not on file   Highest education level: Not on file  Occupational History   Not on file  Tobacco Use   Smoking status: Never   Smokeless tobacco: Never  Vaping Use   Vaping Use: Never  used  Substance and Sexual Activity   Alcohol use: No   Drug use: No   Sexual activity: Not on file  Other Topics Concern   Not on file  Social History Narrative   Not on file   Social Determinants of Health   Financial Resource Strain: Low Risk    Difficulty of Paying Living Expenses:  Not hard at all  Food Insecurity: No Food Insecurity   Worried About Charity fundraiser in the Last Year: Never true   Ran Out of Food in the Last Year: Never true  Transportation Needs: No Transportation Needs   Lack of Transportation (Medical): No   Lack of Transportation (Non-Medical): No  Physical Activity: Sufficiently Active   Days of Exercise per Week: 5 days   Minutes of Exercise per Session: 50 min  Stress: No Stress Concern Present   Feeling of Stress : Not at all  Social Connections: Moderately Integrated   Frequency of Communication with Friends and Family: More than three times a week   Frequency of Social Gatherings with Friends and Family: More than three times a week   Attends Religious Services: More than 4 times per year   Active Member of [employer] or Organizations: Yes   Attends Archivist Meetings: More than 4 times per year   Marital Status: Widowed  Human resources officer Violence: Not At Risk   Fear of Current or Ex-Partner: No   Emotionally Abused: No   Physically Abused: No   Sexually Abused: No     Review of Systems  All other systems reviewed and are negative.     Objective:   Physical Exam Vitals reviewed.  Constitutional:      General: He is not in acute distress.    Appearance: He is well-developed. He is not diaphoretic.  HENT:     Head: Normocephalic.     Right Ear: External ear normal.     Left Ear: External ear normal.     Nose: Nose normal.     Mouth/Throat:     Pharynx: No oropharyngeal exudate.  Eyes:     Conjunctiva/sclera: Conjunctivae normal.     Pupils: Pupils are equal, round, and reactive to light.  Cardiovascular:     Rate and  Rhythm: Normal rate and regular rhythm.     Heart sounds: Normal heart sounds.  Pulmonary:     Effort: Pulmonary effort is normal. No respiratory distress.     Breath sounds: Normal breath sounds. No wheezing or rales.  Abdominal:     General: Bowel sounds are normal. There is no distension.     Palpations: Abdomen is soft.     Tenderness: There is no abdominal tenderness. There is no rebound.  Musculoskeletal:     Cervical back: Neck supple.     Right hip: Tenderness and bony tenderness present. No crepitus. Normal range of motion.       Legs:  Lymphadenopathy:     Cervical: No cervical adenopathy.  Skin:    Findings: No erythema or rash.          Assessment & Plan:  Encounter for Medicare annual wellness exam  Primary hypertension  Greater trochanteric bursitis of left hip Patient's physical exam today is outstanding.  His blood work is excellent.  Due to his age she is not due for any cancer screening.  He declines any falls or memory loss or depression.  Recommended the COVID booster as well as the shingles vaccine.  Using sterile technique, I injected the left greater trochanteric bursa with 1 cc of lidocaine and 1 cc of 40 mg/mL Kenalog.  Patient has hallux valgus on both feet with significant bunion formation.  At the present time, he denies pain bad enough to request surgery.

## 2021-08-31 ENCOUNTER — Other Ambulatory Visit: Payer: Self-pay

## 2021-08-31 ENCOUNTER — Ambulatory Visit (INDEPENDENT_AMBULATORY_CARE_PROVIDER_SITE_OTHER): Payer: Medicare Other | Admitting: Family Medicine

## 2021-08-31 ENCOUNTER — Encounter: Payer: Self-pay | Admitting: Family Medicine

## 2021-08-31 VITALS — BP 118/62 | HR 102 | Temp 97.3°F | Resp 18 | Ht 74.0 in | Wt 217.0 lb

## 2021-08-31 DIAGNOSIS — K5900 Constipation, unspecified: Secondary | ICD-10-CM

## 2021-08-31 NOTE — Progress Notes (Signed)
? ?Subjective:  ? ? Patient ID: Jesse Doyne., male    DOB: Dec 22, 1934, 87 y.o.   MRN: [id number] ? ?Patient states that he has dealt with constipation for years.  He usually goes every other day.  He takes an over-the-counter stool softener without believe his Colace.  However over the last 2 weeks he is going 3 to 4 days without having a bowel movement.  What he is experiencing comes out extremely hard and very small.  He denies any melena or hematochezia.  He denies any abdominal pain.  He denies any fevers or chills.  He denies any nausea vomiting. ?Past Medical History:  ?Diagnosis Date  ? Blood in stool   ? Hemorrhoids   ? Hyperlipidemia   ? Hypertension   ? Rectal fissure   ? Rectal pain   ? ?Past Surgical History:  ?Procedure Laterality Date  ? HERNIA REPAIR  2000 (approx)  ? lower back surgery  05/1995, 05/1998  ? SHOULDER SURGERY  1997  ? both  ? ?Current Outpatient Medications on File Prior to Visit  ?Medication Sig Dispense Refill  ? lisinopril-hydrochlorothiazide (ZESTORETIC) 10-12.5 MG tablet Take 1 tablet by mouth 2 (two) times daily. 180 tablet 3  ? metoprolol tartrate (LOPRESSOR) 25 MG tablet Take 1 tablet by mouth twice daily 180 tablet 1  ? Multiple Vitamins-Minerals (CENTRUM SILVER ADULT 50+ PO) Take 1 tablet by mouth daily.    ? simvastatin (ZOCOR) 20 MG tablet TAKE 1 TABLET BY MOUTH BY MOUTH AT BEDTIME.OFFICE VISIT NEEDED FOR MORE REFILLS 90 tablet 0  ? ?No current facility-administered medications on file prior to visit.  ? ?Allergies  ?Allergen Reactions  ? Doxycycline   ?  GI upset   ? ?Social History  ? ?Socioeconomic History  ? Marital status: Widowed  ?  Spouse name: Not on file  ? Number of children: Not on file  ? Years of education: Not on file  ? Highest education level: Not on file  ?Occupational History  ? Not on file  ?Tobacco Use  ? Smoking status: Never  ? Smokeless tobacco: Never  ?Vaping Use  ? Vaping Use: Never used  ?Substance and Sexual Activity  ? Alcohol use: No  ?  Drug use: No  ? Sexual activity: Not on file  ?Other Topics Concern  ? Not on file  ?Social History Narrative  ? Not on file  ? ?Social Determinants of Health  ? ?Financial Resource Strain: Low Risk   ? Difficulty of Paying Living Expenses: Not hard at all  ?Food Insecurity: No Food Insecurity  ? Worried About Charity fundraiser in the Last Year: Never true  ? Ran Out of Food in the Last Year: Never true  ?Transportation Needs: No Transportation Needs  ? Lack of Transportation (Medical): No  ? Lack of Transportation (Non-Medical): No  ?Physical Activity: Sufficiently Active  ? Days of Exercise per Week: 5 days  ? Minutes of Exercise per Session: 50 min  ?Stress: No Stress Concern Present  ? Feeling of Stress : Not at all  ?Social Connections: Moderately Integrated  ? Frequency of Communication with Friends and Family: More than three times a week  ? Frequency of Social Gatherings with Friends and Family: More than three times a week  ? Attends Religious Services: More than 4 times per year  ? Active Member of Clubs or Organizations: Yes  ? Attends Archivist Meetings: More than 4 times per year  ? Marital Status:  Widowed  ?Intimate Partner Violence: Not At Risk  ? Fear of Current or Ex-Partner: No  ? Emotionally Abused: No  ? Physically Abused: No  ? Sexually Abused: No  ? ? ? ? ?Review of Systems  ?All other systems reviewed and are negative. ? ?   ?Objective:  ? Physical Exam ?Vitals reviewed.  ?Constitutional:   ?   Appearance: Normal appearance. He is normal weight.  ?HENT:  ?   Head: Normocephalic and atraumatic.  ?Cardiovascular:  ?   Rate and Rhythm: Normal rate and regular rhythm.  ?   Heart sounds: Normal heart sounds. No murmur heard. ?Pulmonary:  ?   Effort: Pulmonary effort is normal. No respiratory distress.  ?   Breath sounds: Normal breath sounds. No wheezing, rhonchi or rales.  ?Chest:  ?Breasts: ?   Right: Normal. No inverted nipple, mass, nipple discharge or skin change.  ?   Left:  Normal. No inverted nipple, mass, nipple discharge or skin change.  ?Abdominal:  ?   General: Bowel sounds are normal. There is no distension.  ?   Palpations: Abdomen is soft. There is no mass.  ?   Tenderness: There is no abdominal tenderness. There is no guarding or rebound.  ?   Hernia: No hernia is present.  ?Neurological:  ?   General: No focal deficit present.  ?   Mental Status: He is alert. Mental status is at baseline.  ?   Cranial Nerves: No cranial nerve deficit.  ?   Sensory: No sensory deficit.  ?   Motor: No weakness.  ?   Coordination: Coordination normal.  ?   Gait: Gait normal.  ? ? ? ? ? ?   ?Assessment & Plan:  ?Constipation, unspecified constipation type ?Recommended trying Linzess 145 mcg daily for the next 1 to 2 days.  If he is not experiencing success with this I would add a stimulant laxative such as Dulcolax 15 mg p.o. x1.  Recommended increasing his fiber intake and drinking plenty of water ?

## 2021-09-04 ENCOUNTER — Encounter: Payer: Self-pay | Admitting: Gastroenterology

## 2021-09-05 ENCOUNTER — Encounter: Payer: Self-pay | Admitting: Gastroenterology

## 2021-09-06 ENCOUNTER — Ambulatory Visit (INDEPENDENT_AMBULATORY_CARE_PROVIDER_SITE_OTHER): Payer: Medicare Other | Admitting: Family Medicine

## 2021-09-06 ENCOUNTER — Encounter: Payer: Self-pay | Admitting: Family Medicine

## 2021-09-06 VITALS — BP 124/68 | HR 68 | Temp 97.3°F | Resp 18 | Ht 74.0 in | Wt 217.0 lb

## 2021-09-06 DIAGNOSIS — K5900 Constipation, unspecified: Secondary | ICD-10-CM | POA: Diagnosis not present

## 2021-09-06 NOTE — Progress Notes (Signed)
? ?Subjective:  ? ? Patient ID: Jesse Doyne., male    DOB: [birth year], 87 y.o.   MRN: [id number] ?08/31/21 ?Patient states that he has dealt with constipation for years.  He usually goes every other day.  He takes an over-the-counter stool softener without believe his Colace.  However over the last 2 weeks he is going 3 to 4 days without having a bowel movement.  What he is experiencing comes out extremely hard and very small.  He denies any melena or hematochezia.  He denies any abdominal pain.  He denies any fevers or chills.  He denies any nausea vomiting.  At that time, my plan was: ? ?Recommended trying Linzess 145 mcg daily for the next 1 to 2 days.  If he is not experiencing success with this I would add a stimulant laxative such as Dulcolax 15 mg p.o. x1.  Recommended increasing his fiber intake and drinking plenty of water ? ?09/06/21 ?Patient is here today with his daughter.  Apparently on Friday he took some MiraLAX and had a watery bowel movement.  He then continued to take Linzess on Saturday and Sunday and experienced no bowel movements.  He took milk of magnesia on Monday and maybe had again some watery stool.  He has not had a normal bowel movement in weeks.  He feels bloated and full.  He denies any abdominal pain.  He denies any fever or chills or melena or hematochezia.  He denies any right upper quadrant pain or epigastric pain although he does have some nausea and early satiety. ?Past Medical History:  ?Diagnosis Date  ? Blood in stool   ? Hemorrhoids   ? Hyperlipidemia   ? Hypertension   ? Rectal fissure   ? Rectal pain   ? ?Past Surgical History:  ?Procedure Laterality Date  ? HERNIA REPAIR  2000 (approx)  ? lower back surgery  05/1995, 05/1998  ? SHOULDER SURGERY  1997  ? both  ? ?Current Outpatient Medications on File Prior to Visit  ?Medication Sig Dispense Refill  ? lisinopril-hydrochlorothiazide (ZESTORETIC) 10-12.5 MG tablet Take 1 tablet by mouth 2 (two) times daily. 180 tablet 3  ?  metoprolol tartrate (LOPRESSOR) 25 MG tablet Take 1 tablet by mouth twice daily 180 tablet 1  ? Multiple Vitamins-Minerals (CENTRUM SILVER ADULT 50+ PO) Take 1 tablet by mouth daily.    ? simvastatin (ZOCOR) 20 MG tablet TAKE 1 TABLET BY MOUTH BY MOUTH AT BEDTIME.OFFICE VISIT NEEDED FOR MORE REFILLS 90 tablet 0  ? ?No current facility-administered medications on file prior to visit.  ? ?Allergies  ?Allergen Reactions  ? Doxycycline   ?  GI upset   ? ?Social History  ? ?Socioeconomic History  ? Marital status: Widowed  ?  Spouse name: Not on file  ? Number of children: Not on file  ? Years of education: Not on file  ? Highest education level: Not on file  ?Occupational History  ? Not on file  ?Tobacco Use  ? Smoking status: Never  ? Smokeless tobacco: Never  ?Vaping Use  ? Vaping Use: Never used  ?Substance and Sexual Activity  ? Alcohol use: No  ? Drug use: No  ? Sexual activity: Not on file  ?Other Topics Concern  ? Not on file  ?Social History Narrative  ? Not on file  ? ?Social Determinants of Health  ? ?Financial Resource Strain: Low Risk   ? Difficulty of Paying Living Expenses: Not hard at all  ?  Food Insecurity: No Food Insecurity  ? Worried About Charity fundraiser in the Last Year: Never true  ? Ran Out of Food in the Last Year: Never true  ?Transportation Needs: No Transportation Needs  ? Lack of Transportation (Medical): No  ? Lack of Transportation (Non-Medical): No  ?Physical Activity: Sufficiently Active  ? Days of Exercise per Week: 5 days  ? Minutes of Exercise per Session: 50 min  ?Stress: No Stress Concern Present  ? Feeling of Stress : Not at all  ?Social Connections: Moderately Integrated  ? Frequency of Communication with Friends and Family: More than three times a week  ? Frequency of Social Gatherings with Friends and Family: More than three times a week  ? Attends Religious Services: More than 4 times per year  ? Active Member of Clubs or Organizations: Yes  ? Attends Archivist  Meetings: More than 4 times per year  ? Marital Status: Widowed  ?Intimate Partner Violence: Not At Risk  ? Fear of Current or Ex-Partner: No  ? Emotionally Abused: No  ? Physically Abused: No  ? Sexually Abused: No  ? ? ? ? ?Review of Systems  ?All other systems reviewed and are negative. ? ?   ?Objective:  ? Physical Exam ?Vitals reviewed.  ?Constitutional:   ?   Appearance: Normal appearance. He is normal weight.  ?HENT:  ?   Head: Normocephalic and atraumatic.  ?Cardiovascular:  ?   Rate and Rhythm: Normal rate and regular rhythm.  ?   Heart sounds: Normal heart sounds. No murmur heard. ?Pulmonary:  ?   Effort: Pulmonary effort is normal. No respiratory distress.  ?   Breath sounds: Normal breath sounds. No wheezing, rhonchi or rales.  ?Chest:  ?Breasts: ?   Right: Normal. No inverted nipple, mass, nipple discharge or skin change.  ?   Left: Normal. No inverted nipple, mass, nipple discharge or skin change.  ?Abdominal:  ?   General: Bowel sounds are normal. There is no distension.  ?   Palpations: Abdomen is soft. There is no mass.  ?   Tenderness: There is no abdominal tenderness. There is no guarding or rebound.  ?   Hernia: No hernia is present.  ?Neurological:  ?   General: No focal deficit present.  ?   Mental Status: He is alert. Mental status is at baseline.  ?   Cranial Nerves: No cranial nerve deficit.  ?   Sensory: No sensory deficit.  ?   Motor: No weakness.  ?   Coordination: Coordination normal.  ?   Gait: Gait normal.  ? ? ? ? ? ?   ?Assessment & Plan:  ?Constipation, unspecified constipation type ?I believe the patient needs to have a full evacuation.  I believe that the watery stool was likely "blow by" diarrhea but did not successfully remove fecal obstruction.  I would like him to take MiraLAX 3-4 times a day for the next 48 hours.  If he does not experience a complete evacuation, I want him to add Dulcolax as a stimulant laxative to the MiraLAX.  I believe that this will effectively "clean  the patient out."  Patient has an appointment to see GI next week.  At that point he may benefit from a colonoscopy if he continues to experience abdominal bloating to rule out some type of obstruction. ?

## 2021-09-13 ENCOUNTER — Other Ambulatory Visit (INDEPENDENT_AMBULATORY_CARE_PROVIDER_SITE_OTHER): Payer: Medicare Other

## 2021-09-13 ENCOUNTER — Encounter: Payer: Self-pay | Admitting: Gastroenterology

## 2021-09-13 ENCOUNTER — Ambulatory Visit: Payer: Medicare Other | Admitting: Gastroenterology

## 2021-09-13 VITALS — BP 150/84 | HR 96 | Ht 74.0 in | Wt 209.8 lb

## 2021-09-13 DIAGNOSIS — R634 Abnormal weight loss: Secondary | ICD-10-CM | POA: Insufficient documentation

## 2021-09-13 DIAGNOSIS — K5900 Constipation, unspecified: Secondary | ICD-10-CM

## 2021-09-13 DIAGNOSIS — R194 Change in bowel habit: Secondary | ICD-10-CM | POA: Diagnosis not present

## 2021-09-13 LAB — COMPREHENSIVE METABOLIC PANEL
ALT: 23 U/L (ref 0–53)
AST: 22 U/L (ref 0–37)
Albumin: 4.1 g/dL (ref 3.5–5.2)
Alkaline Phosphatase: 58 U/L (ref 39–117)
BUN: 18 mg/dL (ref 6–23)
CO2: 28 mEq/L (ref 19–32)
Calcium: 9.6 mg/dL (ref 8.4–10.5)
Chloride: 93 mEq/L — ABNORMAL LOW (ref 96–112)
Creatinine, Ser: 1.12 mg/dL (ref 0.40–1.50)
GFR: 60.05 mL/min (ref >60.00)
Glucose, Bld: 112 mg/dL — ABNORMAL HIGH (ref 70–99)
Potassium: 5.1 mEq/L (ref 3.5–5.1)
Sodium: 127 mEq/L — ABNORMAL LOW (ref 135–145)
Total Bilirubin: 0.8 mg/dL (ref 0.2–1.2)
Total Protein: 6.5 g/dL (ref 6.0–8.3)

## 2021-09-13 LAB — TSH: TSH: 1.92 u[IU]/mL (ref 0.35–5.50)

## 2021-09-13 NOTE — Progress Notes (Signed)
I agree with the above note, plan 

## 2021-09-13 NOTE — Progress Notes (Signed)
? ? ? ?09/13/2021 ?Jesse Fletcher. ?[id number] ?[birth year] ? ? ?HISTORY OF PRESENT ILLNESS: This is a pleasant 87 year old male who is a patient of Dr. Ardis Hughs, not seen here since 2008 at the time of his last colonoscopy, which showed only hemorrhoids.  He is here today with complaints of constipation.  His daughter is present.  He tells me that he has struggled with constipation intermittently over the years.  The last 3 weeks, however, its been more of an issue.  He says that he is unable to have a bowel movement without medication for the past few weeks.  Has been taking some over-the-counter regimens and his PCP, Dr. Dennard Schaumann, had him trying a few different things.  He says that he was given some samples of Linzess, but unsure of the dosing.  Took milk of magnesia on 1 occasion with some results.  Took some MiraLAX for a few days and then added Dulcolax.  He says he took 3 Dulcolax last evening and had a decent bowel movement this morning.  He denies any rectal bleeding.  Had a small amount of lower abdominal pain yesterday, but passed gas and that resolved.  He says he has not been eating as much lately and scales show that he is down about 8 or 9 pounds. ? ?Past Medical History:  ?Diagnosis Date  ? Blood in stool   ? Hemorrhoids   ? Hyperlipidemia   ? Hypertension   ? Rectal fissure   ? Rectal pain   ? ?Past Surgical History:  ?Procedure Laterality Date  ? HERNIA REPAIR  2000 (approx)  ? lower back surgery  05/1995, 05/1998  ? SHOULDER SURGERY  1997  ? both  ? ? reports that he has never smoked. He has never used smokeless tobacco. He reports that he does not drink alcohol and does not use drugs. ?family history is not on file. ?Allergies  ?Allergen Reactions  ? Doxycycline   ?  GI upset   ? ? ?  ?Outpatient Encounter Medications as of 09/13/2021  ?Medication Sig  ? lisinopril-hydrochlorothiazide (ZESTORETIC) 10-12.5 MG tablet Take 1 tablet by mouth 2 (two) times daily.  ? metoprolol tartrate (LOPRESSOR) 25 MG  tablet Take 1 tablet by mouth twice daily  ? Multiple Vitamins-Minerals (CENTRUM SILVER ADULT 50+ PO) Take 1 tablet by mouth daily.  ? simvastatin (ZOCOR) 20 MG tablet TAKE 1 TABLET BY MOUTH BY MOUTH AT BEDTIME.OFFICE VISIT NEEDED FOR MORE REFILLS  ? ?No facility-administered encounter medications on file as of 09/13/2021.  ? ? ? ?REVIEW OF SYSTEMS  : All other systems reviewed and negative except where noted in the History of Present Illness. ? ? ?PHYSICAL EXAM: ?BP (!) 150/84   Pulse 96   Ht '6\' 2"'$  (1.88 m)   Wt 209 lb 12.8 oz (95.2 kg)   BMI 26.94 kg/m?  ?General: Well developed white male in no acute distress ?Head: Normocephalic and atraumatic ?Eyes:  Sclerae anicteric, conjunctiva pink. ?Ears: Normal auditory acuity ?Lungs: Clear throughout to auscultation; no W/R/R. ?Heart: Regular rate and rhythm; no M/R/G. ?Abdomen: Soft, non-distended.  BS present.  Non-tender. ?Musculoskeletal: Symmetrical with no gross deformities  ?Skin: No lesions on visible extremities ?Extremities: No edema  ?Neurological: Alert oriented x 4, grossly non-focal ?Psychological:  Alert and cooperative. Normal mood and affect ? ?ASSESSMENT AND PLAN: ?*87 year old male who has battled with some constipation intermittently for quite some time, but recently having more issues in the past 3 weeks or so.  Has tried  a few different regimens for a couple days at a time without a lot of success.  He denies abdominal pain or rectal bleeding, but I do note that he has lost about 9 pounds.  He says he has not been eating as much.  Last colonoscopy was in 2008.  Although he is 58 I think that he would withstand colonoscopy just fine if need be, but we discussed doing CT scan instead first as a less invasive route to be sure no concerning findings.  Otherwise we discussed that constipation is usually a motility issue.  We need to check a BMP for renal function in order to have CT scan so I am goingcheck a TSH as well.  We are going to instruct him  on a MiraLAX bowel purge to be taken this evening and then will begin MiraLAX daily starting tomorrow morning. ? ? ?CC:  Susy Frizzle, MD ? ?  ?

## 2021-09-13 NOTE — Patient Instructions (Addendum)
If you are age 85 or older, your body mass index should be between 23-30. Your Body mass index is 26.94 kg/m?Marland Kitchen If this is out of the aforementioned range listed, please consider follow up with your Primary Care Provider. ? ?If you are age 23 or younger, your body mass index should be between 19-25. Your Body mass index is 26.94 kg/m?Marland Kitchen If this is out of the aformentioned range listed, please consider follow up with your Primary Care Provider.  ? ?Your provider has requested that you go to the basement level for lab work before leaving today. Press "B" on the elevator. The lab is located at the first door on the left as you exit the elevator. ? ? ?You have been scheduled for a CT scan of the abdomen and pelvis at [facility], 1st floor Radiology. You are scheduled on 09/19/21  at [place] should arrive 15 minutes prior to your appointment time for registration.  Please pick up 2 bottles of contrast from [facility] at least 3 days prior to your scan. The solution may taste better if refrigerated, but do NOT add ice or any other liquid to this solution. Shake well before drinking.  ? ?Please follow the written instructions below on the day of your exam:  ? ?1) Do not eat anything after 1pm (4 hours prior to your test)  ? ?2) Drink 1 bottle of contrast @ 2pm (2 hours prior to your exam)  Remember to shake well before drinking and do NOT pour over ice. ?    Drink 1 bottle of contrast @ 3pm (1 hour prior to your exam)  ? ?You may take any medications as prescribed with a small amount of water, if necessary. If you take any of the following medications: METFORMIN, GLUCOPHAGE, GLUCOVANCE, AVANDAMET, RIOMET, FORTAMET, ACTOPLUS MET, JANUMET, [place] or METAGLIP, you MAY be asked to HOLD this medication 48 hours AFTER the exam.  ? ?The purpose of you drinking the oral contrast is to aid in the visualization of your intestinal tract. The contrast solution may cause some diarrhea. Depending on your individual set of  symptoms, you may also receive an intravenous injection of x-ray contrast/dye. Plan on being at [facility] for 45 minutes or longer, depending on the type of exam you are having performed.  ? ?If you have any questions regarding your exam or if you need to reschedule, you may call [facility] at [phone] between the hours of 8:00 am and 5:00 pm, Monday-Friday.  ? ?Janett Billow recommends that you complete a bowel purge (to clean out your bowels). Please do the following: ?Purchase a bottle of Miralax over the counter as well as a box of 5 mg dulcolax tablets. ?Take 4 dulcolax tablets. ?Wait 1 hour. ?You will then drink 6-8 capfuls of Miralax mixed in an adequate amount of water/juice/gatorade (you may choose which of these liquids to drink) over the next 2-3 hours. ?You should expect results within 1 to 6 hours after completing the bowel purge. ? ?Start Miralax daily the morning og 3/16. ? ? ?The [facility] providers would like to encourage you to use [facility] to communicate with providers for non-urgent requests or questions.  Due to long hold times on the telephone, sending your provider a message by [facility] may be a faster and more efficient way to get a response.  Please allow 48 business hours for a response.  Please remember that this is for non-urgent requests.  ? ?Due to recent changes  in healthcare laws, you may see the results of your imaging and laboratory studies on MyChart before your provider has had a chance to review them.  We understand that in some cases there may be results that are confusing or concerning to you. Not all laboratory results come back in the same time frame and the provider may be waiting for multiple results in order to interpret others.  Please give [place] 48 hours in order for your provider to thoroughly review all the results before contacting the office for clarification of your results.  ? ? ?It was a pleasure to see you today! ? ?Thank you for trusting me with your  gastrointestinal care!   ? ?Alonza Bogus, PAC-C ? ?

## 2021-09-19 ENCOUNTER — Other Ambulatory Visit: Payer: Self-pay

## 2021-09-19 ENCOUNTER — Ambulatory Visit (HOSPITAL_COMMUNITY)
Admission: RE | Admit: 2021-09-19 | Discharge: 2021-09-19 | Disposition: A | Discharge status: Home / Self Care | Payer: Medicare Other | Source: Ambulatory Visit | Attending: Gastroenterology | Admitting: Gastroenterology

## 2021-09-19 DIAGNOSIS — R194 Change in bowel habit: Secondary | ICD-10-CM

## 2021-09-19 DIAGNOSIS — N2889 Other specified disorders of kidney and ureter: Secondary | ICD-10-CM | POA: Insufficient documentation

## 2021-09-19 DIAGNOSIS — N400 Enlarged prostate without lower urinary tract symptoms: Secondary | ICD-10-CM | POA: Insufficient documentation

## 2021-09-19 DIAGNOSIS — K5900 Constipation, unspecified: Secondary | ICD-10-CM

## 2021-09-19 DIAGNOSIS — R634 Abnormal weight loss: Secondary | ICD-10-CM

## 2021-09-19 DIAGNOSIS — I700 Atherosclerosis of aorta: Secondary | ICD-10-CM | POA: Insufficient documentation

## 2021-09-19 MED ORDER — IOHEXOL 300 MG/ML  SOLN
100.0000 mL | Freq: Once | INTRAMUSCULAR | Status: AC | PRN
  Administered 2021-09-19: 100 mL via INTRAVENOUS

## 2021-09-19 MED ORDER — SODIUM CHLORIDE (PF) 0.9 % IJ SOLN
INTRAMUSCULAR | Status: AC
  Filled 2021-09-19: qty 50

## 2021-09-21 ENCOUNTER — Telehealth: Payer: Self-pay | Admitting: Family Medicine

## 2021-09-21 ENCOUNTER — Other Ambulatory Visit: Payer: Self-pay | Admitting: Family Medicine

## 2021-09-21 NOTE — Telephone Encounter (Signed)
Patient called to follow up on recent CT scan; states next instructions were for him to have an MRI. Patient requesting a referral for scheduling. Please advise at [phone]. ?

## 2021-09-21 NOTE — Telephone Encounter (Signed)
Received call from patient's daughter Jesse Fletcher to follow up on lesion found on kidney during recent CT scan. Requesting call back for what to do next; asked if referral to a specialist is needed. ? ?Please advise at [phone] or [phone]. ?

## 2021-09-22 NOTE — Telephone Encounter (Signed)
Please advice  

## 2021-09-25 ENCOUNTER — Telehealth: Payer: Self-pay | Admitting: Gastroenterology

## 2021-09-25 ENCOUNTER — Telehealth: Payer: Self-pay

## 2021-09-25 ENCOUNTER — Other Ambulatory Visit: Payer: Self-pay | Admitting: Family Medicine

## 2021-09-25 DIAGNOSIS — N2889 Other specified disorders of kidney and ureter: Secondary | ICD-10-CM

## 2021-09-25 NOTE — Telephone Encounter (Signed)
Spoke with pt, pt aware. Referral and order placed ?

## 2021-09-25 NOTE — Telephone Encounter (Signed)
Patients daughter [name] called to follow up on an MRI that was recommended for this patient. Said they have not heard from anyone to schedule it. ?

## 2021-09-25 NOTE — Telephone Encounter (Signed)
Pt came into office with a bill from [facility]. Pt states he does not believe that this is his bill due to the insurance name on the bill. Pt states that this is not his insurance company. Pt has [place]. A copy of the bill and pt's insurance card has been place on Administrators desk. Pt states that he doesn't mind paying bill if this is his bill. Please advise. ? ?Cb#: [phone] ?

## 2021-09-25 NOTE — Telephone Encounter (Signed)
Spoke with daughter [name] regarding MRI. Order was just placed today by PCP so they should hear from schedulers within a week or so. Number provided for patient's daughter, however advised that she speak with PCP office.  ?

## 2021-10-02 ENCOUNTER — Telehealth: Payer: Self-pay | Admitting: Family Medicine

## 2021-10-02 NOTE — Telephone Encounter (Signed)
Try to call pt, LM.  ?

## 2021-10-02 NOTE — Telephone Encounter (Signed)
As requested, the patient called to inform you he has an appointment for his MRI on 10/07/21. ? ?Please advise at [phone]. ?

## 2021-10-04 NOTE — Telephone Encounter (Signed)
I have sent an email to [place] at [place] shows the same ID on the statement as it does for his [employer]. ?

## 2021-10-07 ENCOUNTER — Ambulatory Visit (HOSPITAL_COMMUNITY)
Admission: RE | Admit: 2021-10-07 | Discharge: 2021-10-07 | Disposition: A | Discharge status: Home / Self Care | Payer: Medicare Other | Source: Ambulatory Visit | Attending: Family Medicine | Admitting: Family Medicine

## 2021-10-07 DIAGNOSIS — N2889 Other specified disorders of kidney and ureter: Secondary | ICD-10-CM | POA: Diagnosis not present

## 2021-10-07 DIAGNOSIS — N281 Cyst of kidney, acquired: Secondary | ICD-10-CM | POA: Diagnosis not present

## 2021-10-07 MED ORDER — GADOBUTROL 1 MMOL/ML IV SOLN
9.0000 mL | Freq: Once | INTRAVENOUS | Status: AC | PRN
  Administered 2021-10-07: 9 mL via INTRAVENOUS

## 2021-10-12 ENCOUNTER — Other Ambulatory Visit: Payer: Self-pay

## 2021-10-12 ENCOUNTER — Telehealth: Payer: Self-pay

## 2021-10-12 DIAGNOSIS — C649 Malignant neoplasm of unspecified kidney, except renal pelvis: Secondary | ICD-10-CM

## 2021-10-12 NOTE — Telephone Encounter (Signed)
Pt's daughter called in wanting to speak with clinical staff that spoke with pt yesterday about results of MRI. Pt's daughter stated that pt may not have understood the info that he was given. Pt's daughter is listed on as a person of contact and would like for clinical staff to call back with info please. Please advise. ? ?Cb#: [phone] ?

## 2021-10-12 NOTE — Telephone Encounter (Signed)
Referral send to [facility] specialist for Urologist per Dr. Montine Circle 10/12/21 ?

## 2021-10-16 NOTE — Progress Notes (Signed)
H&P ? ?Chief Complaint: Right renal mass right renal mass ? ?History of Present Illness: 87 year old male sent by Dr. Dennard Schaumann for evaluation and management of a right renal mass.  Seen on CT scan performed on 09/20/2021.  CT was ordered due to weight loss and change in bowel habits.  Radiologist then recommended that MRI be performed.    This revealed: ? ? ?1. Partially exophytic, heterogeneously, although poorly contrast ?enhancing mass of the lateral midportion of the left kidney ?measuring 2.0 x 1.8 cm. Findings are suspicious for a small, poorly ?enhancing renal cell carcinoma. ?2. No evidence of renal vein invasion, lymphadenopathy, metastatic ?disease in the abdomen. ? ?He is a regular patient of Dr. Ralene Muskrat at [facility] urology in [place].  He is here today with both his son and daughter to discuss further management. ? ?He is having no flank pain.  Bowels are working normally now. ? ? ? ?Past Medical History:  ?Diagnosis Date  ? Blood in stool   ? Hemorrhoids   ? Hyperlipidemia   ? Hypertension   ? Rectal fissure   ? Rectal pain   ? ? ?Past Surgical History:  ?Procedure Laterality Date  ? HERNIA REPAIR  2000 (approx)  ? lower back surgery  05/1995, 05/1998  ? SHOULDER SURGERY  1997  ? both  ? ? ?Home Medications:  ?Allergies as of 10/17/2021   ? ?   Reactions  ? Doxycycline   ? GI upset   ? ?  ? ?  ?Medication List  ?  ? ?  ? Accurate as of October 16, 2021 12:58 PM. If you have any questions, ask your nurse or doctor.  ?  ?  ? ?  ? ?CENTRUM SILVER ADULT 50+ PO ?Take 1 tablet by mouth daily. ?  ?lisinopril-hydrochlorothiazide 10-12.5 MG tablet ?Commonly known as: ZESTORETIC ?Take 1 tablet by mouth 2 (two) times daily. ?  ?metoprolol tartrate 25 MG tablet ?Commonly known as: LOPRESSOR ?Take 1 tablet by mouth twice daily ?  ?simvastatin 20 MG tablet ?Commonly known as: ZOCOR ?TAKE 1 TABLET BY MOUTH ONCE DAILY AT BEDTIME . APPOINTMENT REQUIRED FOR FUTURE REFILLS ?  ? ?  ? ? ?Allergies:  ?Allergies  ?Allergen  Reactions  ? Doxycycline   ?  GI upset   ? ? ?No family history on file. ? ?Social History:  reports that he has never smoked. He has never used smokeless tobacco. He reports that he does not drink alcohol and does not use drugs. ? ?ROS: ?A complete review of systems was performed.  All systems are negative except for pertinent findings as noted. ? ?Physical Exam:  ?Vital signs in last 24 hours: ?There were no vitals taken for this visit. ?Constitutional:  Alert and oriented, No acute distress ?Cardiovascular: Regular rate  ?Respiratory: Normal respiratory effort ?Neurologic: Grossly intact, no focal deficits ?Psychiatric: Normal mood and affect ? ?I have reviewed prior pt notes ? ?I have reviewed notes from referringphysicians ? ?I have reviewed urinalysis results ? ?I have independently reviewed prior imaging--both CT and MRI scans. ? ? ? ?Impression/Assessment:  ?Incidentally discovered 2 cm left renal mass without any evidence of extrarenal abnormalities.  87 year old healthy male.  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  He has a regularly scheduled appointment to see him in approximately 6 days. ? ?This is more than likely a small renal cell carcinoma.  It is asymptomatic. ? ?Plan:  ?I spent 37 minutes speaking with the patient, his daughter and son about this incidentally  discovered renal mass.  I did discuss the fact that this is quite small, and there is no evidence of extra renal disease or renal vein compromise.  I also discussed the fact that these small masses are usually indolent in 87 year old individuals, and that they are often followed with radiographic surveillance without intervention with surgery or cryoablation. ? ?I strongly recommended that with the small mass he proceed with surveillance with radiographic imaging every 6 to 12 months, and I also let the family know that unless these masses are more than 30 mm in size there is very low risk for extra renal metastases. ? ?They have an appointment  with Dr. Jeffie Pollock next week.  They will look forward to an opinion from him as well. ? ?

## 2021-10-17 ENCOUNTER — Ambulatory Visit: Payer: Medicare Other | Admitting: Urology

## 2021-10-17 ENCOUNTER — Encounter: Payer: Self-pay | Admitting: Urology

## 2021-10-17 VITALS — BP 130/83 | HR 77

## 2021-10-17 DIAGNOSIS — N2889 Other specified disorders of kidney and ureter: Secondary | ICD-10-CM

## 2021-10-17 LAB — URINALYSIS, ROUTINE W REFLEX MICROSCOPIC
Bilirubin, UA: NEGATIVE
Glucose, UA: NEGATIVE
Ketones, UA: NEGATIVE
Leukocytes,UA: NEGATIVE
Nitrite, UA: NEGATIVE
Protein,UA: NEGATIVE
RBC, UA: NEGATIVE
Specific Gravity, UA: 1.015 (ref 1.005–1.030)
Urobilinogen, Ur: 0.2 mg/dL (ref 0.2–1.0)
pH, UA: 6.5 (ref 5.0–7.5)

## 2021-10-18 NOTE — Telephone Encounter (Signed)
Attempted to call daughter today twice, however her phone does not have  ?

## 2021-10-18 NOTE — Telephone Encounter (Signed)
Daughter returned call, Spoke with daughter this morning. Pt seen urologist. Pt will also go back to see his urologist on Monday.  ?

## 2021-10-23 DIAGNOSIS — D4100 Neoplasm of uncertain behavior of unspecified kidney: Secondary | ICD-10-CM | POA: Diagnosis not present

## 2021-10-23 DIAGNOSIS — N411 Chronic prostatitis: Secondary | ICD-10-CM | POA: Diagnosis not present

## 2021-10-26 NOTE — Telephone Encounter (Signed)
Left msg so I could let patient know what [facility] said about bill.  ? ?CB# Cb#: [phone] ?

## 2021-10-26 NOTE — Telephone Encounter (Signed)
Per email:  ?Jesse Fletcher! ? ?This is the response from billing: ? ?Yes, this is his invoice. [employer] is [employer]. ? ?  ? ?Jesse Fletcher ?Conservation officer, historic buildings, Revenue Services  [employer] ?Supporting [facility] ?

## 2021-10-26 NOTE — Telephone Encounter (Signed)
Patient returned my call he has already paid the $5.00. He wasn't sure why I was calling he had already taken care of the bill. I apologized for any confusion and told him to have a good day. ?

## 2021-10-29 DIAGNOSIS — S01511A Laceration without foreign body of lip, initial encounter: Secondary | ICD-10-CM | POA: Diagnosis not present

## 2021-10-29 DIAGNOSIS — S51811A Laceration without foreign body of right forearm, initial encounter: Secondary | ICD-10-CM | POA: Diagnosis not present

## 2021-10-31 DIAGNOSIS — D49512 Neoplasm of unspecified behavior of left kidney: Secondary | ICD-10-CM | POA: Diagnosis not present

## 2021-11-03 ENCOUNTER — Other Ambulatory Visit: Payer: Self-pay | Admitting: Urology

## 2021-11-03 DIAGNOSIS — N2889 Other specified disorders of kidney and ureter: Secondary | ICD-10-CM

## 2021-11-09 ENCOUNTER — Ambulatory Visit
Admission: RE | Admit: 2021-11-09 | Discharge: 2021-11-09 | Disposition: A | Discharge status: Home / Self Care | Payer: Medicare Other | Source: Ambulatory Visit | Attending: Urology | Admitting: Urology

## 2021-11-09 ENCOUNTER — Encounter: Payer: Self-pay | Admitting: *Deleted

## 2021-11-09 DIAGNOSIS — N2889 Other specified disorders of kidney and ureter: Secondary | ICD-10-CM | POA: Diagnosis not present

## 2021-11-09 HISTORY — PX: IR RADIOLOGIST EVAL & MGMT: IMG5224

## 2021-11-09 NOTE — Consult Note (Signed)
? ? ?Chief Complaint: ?Patient was seen in consultation today for evaluation of a left renal lesion ? at the request of Borden,Lester ? ?Referring Physician(s): ?Borden,Lester ? ?History of Present Illness: ?Jesse Fletcher. is a 87 y.o. male with an incidentally discovered left renal lesion.  Patient had a CT of the abdomen and pelvis for weight loss and constipation on 09/19/2021.  CT demonstrated a 2 cm indeterminate lesion in the left kidney midpole.  Subsequently, the patient underwent an MRI of the abdomen on 10/07/2021 that demonstrated a partially exophytic poorly enhancing mass in the left kidney suspicious for a renal cell carcinoma. He is a patient of Dr.Wrenn with history of kidney stones and prostatitis.  Patient has chronic urinary frequency but denies hematuria or dysuria.  Normal renal function on recent labs.  Past medical history is significant for hyperlipidemia and hypertension.  Past surgical history includes inguinal hernia repair, cystoscopy, shoulder surgery and back surgery.  Patient is accompanied by his daughter. ? ?Past Medical History:  ?Diagnosis Date  ? Blood in stool   ? Hemorrhoids   ? Hyperlipidemia   ? Hypertension   ? Rectal fissure   ? Rectal pain   ? ? ?Past Surgical History:  ?Procedure Laterality Date  ? HERNIA REPAIR  2000 (approx)  ? lower back surgery  05/1995, 05/1998  ? SHOULDER SURGERY  1997  ? both  ? ? ?Allergies: ?Doxycycline ? ?Medications: ?Prior to Admission medications   ?Medication Sig Start Date End Date Taking? Authorizing Provider  ?ciprofloxacin (CIPRO) 500 MG tablet Take 500 mg by mouth 2 (two) times daily. 10/06/21   [provider]  ?lisinopril-hydrochlorothiazide (ZESTORETIC) 10-12.5 MG tablet Take 1 tablet by mouth 2 (two) times daily. 07/26/21   Susy Frizzle, MD  ?metoprolol tartrate (LOPRESSOR) 25 MG tablet Take 1 tablet by mouth twice daily 06/14/21   Susy Frizzle, MD  ?Multiple Vitamins-Minerals (CENTRUM SILVER ADULT 50+ PO) Take 1  tablet by mouth daily.    [provider]  ?simvastatin (ZOCOR) 20 MG tablet TAKE 1 TABLET BY MOUTH ONCE DAILY AT BEDTIME . APPOINTMENT REQUIRED FOR FUTURE REFILLS 09/22/21   Susy Frizzle, MD  ?  ? ?No family history on file. ? ?Social History  ? ?Socioeconomic History  ? Marital status: Widowed  ?  Spouse name: Not on file  ? Number of children: Not on file  ? Years of education: Not on file  ? Highest education level: Not on file  ?Occupational History  ? Not on file  ?Tobacco Use  ? Smoking status: Never  ? Smokeless tobacco: Never  ?Vaping Use  ? Vaping Use: Never used  ?Substance and Sexual Activity  ? Alcohol use: No  ? Drug use: No  ? Sexual activity: Not on file  ?Other Topics Concern  ? Not on file  ?Social History Narrative  ? Not on file  ? ?Social Determinants of Health  ? ?Financial Resource Strain: Low Risk   ? Difficulty of Paying Living Expenses: Not hard at all  ?Food Insecurity: No Food Insecurity  ? Worried About Charity fundraiser in the Last Year: Never true  ? Ran Out of Food in the Last Year: Never true  ?Transportation Needs: No Transportation Needs  ? Lack of Transportation (Medical): No  ? Lack of Transportation (Non-Medical): No  ?Physical Activity: Sufficiently Active  ? Days of Exercise per Week: 5 days  ? Minutes of Exercise per Session: 50 min  ?Stress: No  Stress Concern Present  ? Feeling of Stress : Not at all  ?Social Connections: Moderately Integrated  ? Frequency of Communication with Friends and Family: More than three times a week  ? Frequency of Social Gatherings with Friends and Family: More than three times a week  ? Attends Religious Services: More than 4 times per year  ? Active Member of Clubs or Organizations: Yes  ? Attends Archivist Meetings: More than 4 times per year  ? Marital Status: Widowed  ? ? ?ECOG Status: ?0 - Asymptomatic ? ?Review of Systems: A 12 point ROS discussed and pertinent positives are indicated in the HPI above.  All other  systems are negative. ? ?Review of Systems  ?Constitutional: Negative.   ?Respiratory: Negative.    ?Cardiovascular: Negative.   ?Gastrointestinal: Negative.   ?Genitourinary:  Positive for urgency. Negative for dysuria and hematuria.  ? ?Vital Signs: ?BP (!) 162/71 (BP Location: Left Arm)   Pulse 64   SpO2 94%  ? ?Physical Exam ?Constitutional:   ?   Appearance: Normal appearance. He is not ill-appearing.  ?Cardiovascular:  ?   Rate and Rhythm: Normal rate and regular rhythm.  ?Pulmonary:  ?   Effort: Pulmonary effort is normal.  ?   Breath sounds: Normal breath sounds.  ?Abdominal:  ?   General: Abdomen is flat. There is no distension.  ?   Palpations: Abdomen is soft.  ?   Tenderness: There is no abdominal tenderness.  ?Musculoskeletal:     ?   General: No swelling.  ?Neurological:  ?   Mental Status: He is alert and oriented to person, place, and time.  ? ? ?  ? ?Imaging: ?CLINICAL DATA:  Characterize left renal mass identified by prior CT ?  ?EXAM: ?MRI ABDOMEN WITHOUT AND WITH CONTRAST ?  ?TECHNIQUE: ?Multiplanar multisequence MR imaging of the abdomen was performed ?both before and after the administration of intravenous contrast. ?  ?CONTRAST:  74m GADAVIST GADOBUTROL 1 MMOL/ML IV SOLN ?  ?COMPARISON:  CT abdomen pelvis, 09/19/2021 ?  ?FINDINGS: ?Lower chest: No acute findings. ?  ?Hepatobiliary: No mass or other parenchymal abnormality identified. ?No gallstones. No biliary ductal dilatation. ?  ?Pancreas: No mass, inflammatory changes, or other parenchymal ?abnormality identified.No pancreatic ductal dilatation. ?  ?Spleen:  Within normal limits in size and appearance. ?  ?Adrenals/Urinary Tract: Normal adrenal glands. Partially exophytic, ?heterogeneously, although poorly contrast enhancing mass of the ?lateral midportion of the left kidney measuring 2.0 x 1.8 cm (series ?31, image 59). No evidence of renal vein invasion. Additional ?definitively benign fluid signal cysts bilaterally, for which  no ?further follow-up or characterization is required. No evidence of ?hydronephrosis. ?  ?Stomach/Bowel: Visualized portions within the abdomen are ?unremarkable. ?  ?Vascular/Lymphatic: No pathologically enlarged lymph nodes ?identified. No abdominal aortic aneurysm demonstrated. ?  ?Other:  None. ?  ?Musculoskeletal: No suspicious osseous lesions identified. ?  ?IMPRESSION: ?1. Partially exophytic, heterogeneously, although poorly contrast ?enhancing mass of the lateral midportion of the left kidney ?measuring 2.0 x 1.8 cm. Findings are suspicious for a small, poorly ?enhancing renal cell carcinoma. ?2. No evidence of renal vein invasion, lymphadenopathy, metastatic ?disease in the abdomen. ?  ?  ?Electronically Signed ?  By: ADelanna AhmadiM.D. ?  On: 10/07/2021 11:54 ? ?Labs: ? ?  Latest Ref Rng & Units 09/13/2021  ? 12:26 PM 08/03/2021  ?  8:02 AM 08/16/2020  ?  8:11 AM  ?CMP  ?Glucose 70 - 99 mg/dL 112  104   102    ?BUN 6 - 23 mg/dL '18   18   18    '$ ?Creatinine 0.40 - 1.50 mg/dL 1.12   1.11   1.03    ?Sodium 135 - 145 mEq/L 127   134   138    ?Potassium 3.5 - 5.1 mEq/L 5.1   4.4   4.2    ?Chloride 96 - 112 mEq/L 93   97   101    ?CO2 19 - 32 mEq/L '28   30   30    '$ ?Calcium 8.4 - 10.5 mg/dL 9.6   9.5   9.5    ?Total Protein 6.0 - 8.3 g/dL 6.5   6.4     ?Total Bilirubin 0.2 - 1.2 mg/dL 0.8   0.8     ?Alkaline Phos 39 - 117 U/L 58      ?AST 0 - 37 U/L 22   19     ?ALT 0 - 53 U/L 23   16     ? ?CBC: ?Recent Labs  ?  08/03/21 ?0802  ?WBC 5.9  ?HGB 15.0  ?HCT 45.4  ?PLT 310  ? ? ?COAGS: ?No results for input(s): INR, APTT in the last 8760 hours. ? ?BMP: ?Recent Labs  ?  08/03/21 ?0802 09/13/21 ?1226  ?NA 134* 127*  ?K 4.4 5.1  ?CL 97* 93*  ?CO2 30 28  ?GLUCOSE 104* 112*  ?BUN 18 18  ?CALCIUM 9.5 9.6  ?CREATININE 1.11 1.12  ? ? ?LIVER FUNCTION TESTS: ?Recent Labs  ?  08/03/21 ?0802 09/13/21 ?1226  ?BILITOT 0.8 0.8  ?AST 19 22  ?ALT 16 23  ?ALKPHOS  --  58  ?PROT 6.4 6.5  ?ALBUMIN  --  4.1  ? ? ?TUMOR MARKERS: ?No results  for input(s): AFPTM, CEA, CA199, CHROMGRNA in the last 8760 hours. ? ?Assessment and Plan: ? ?87 year old with a suspicious left renal lesion.  Lesion is slightly exophytic and located along the lateral interp

## 2021-11-10 ENCOUNTER — Telehealth: Payer: Self-pay | Admitting: Cardiovascular Disease

## 2021-11-10 NOTE — Telephone Encounter (Signed)
Primary Cardiologist:Timothy Rockey Situ, MD ? ?Chart reviewed as part of pre-operative protocol coverage. Because of Jesse Fletcher Jr.'s past medical history and time since last visit, he/she will require a follow-up visit in order to better assess preoperative cardiovascular risk. ? ?Pre-op covering staff: ?- Please schedule appointment and call patient to inform them. ?- Please contact requesting surgeon's office via preferred method (i.e, phone, fax) to inform them of need for appointment prior to surgery. ? ?If applicable, this message will also be routed to pharmacy pool and/or primary cardiologist for input on holding anticoagulant/antiplatelet agent as requested below so that this information is available at time of patient's appointment.  ? ?Deberah Pelton, NP  ?11/10/2021, 11:18 AM  ? ?

## 2021-11-10 NOTE — Telephone Encounter (Signed)
Pt appt has been moved up sooner with Dr. Rockey Situ 11/15/21 @ 10:40. Pt has opted to cancel the 12/19/21 as we moved appt up sooner.  ? ?I will send FYI to requesting office the pt has appt  ?

## 2021-11-10 NOTE — Telephone Encounter (Signed)
? ?  Pre-operative Risk Assessment  ?  ?Patient Name: Jesse Fletcher.  ?DOB: 06-21-1935 ?MRN: [id number]  ? ?  ? ?Request for Surgical Clearance   ? ?Procedure:   Left Renal Cryoablation Biopsy ? ?Date of Surgery:  Clearance TBD                              ?   ?Surgeon:  Dr. Markus Daft ?Surgeon's Group or Practice Name:  [facility] ?Phone number:  [phone] ?Fax number:  [phone] ?  ?Type of Clearance Requested:   ?- Medical  ?  ?Type of Anesthesia:  General  ?  ?Additional requests/questions:  Please advise surgeon/provider what medications should be held. ? ?Signed, ?Belisicia T Harris   ?11/10/2021, 9:27 AM  ? ?

## 2021-11-14 ENCOUNTER — Other Ambulatory Visit (HOSPITAL_COMMUNITY): Payer: Self-pay | Admitting: Diagnostic Radiology

## 2021-11-14 DIAGNOSIS — N2889 Other specified disorders of kidney and ureter: Secondary | ICD-10-CM

## 2021-11-14 NOTE — Progress Notes (Signed)
?  ?  ? ? ? ?Date:  11/15/2021  ? ?ID:  Jesse Fletcher., DOB 1935-06-24, MRN [id number] ? ?Patient Location:  ?[place] ?[place] [id number]  ? ?Provider location:   ?[place] [employer] office ? ?PCP:  Susy Frizzle, MD  ?Cardiologist:  [facility] ? ?Chief Complaint  ?Patient presents with  ? 12 month follow up   ?  "Doing well." Cardiac clearance for a Left Renal Cryoablation Biopsy. Medications reviewed by the patient verbally.   ? ? ?History of Present Illness:   ? ?Jesse Fletcher. is a 87 y.o. male past medical history of ?Chest pain ?HTN ?Hyperlipidemia ?Who presents for follow up of his chest pain, dilated aorta, coronary calcium on CT scan ? ?Last seen in clinic by myself July 2021 ?MRI from April 2023 reviewed ?. Partially exophytic, heterogeneously, although poorly contrast ?enhancing mass of the lateral midportion of the left kidney ?measuring 2.0 x 1.8 cm. Findings are suspicious for a small, poorly ?enhancing renal cell carcinoma. ? ?Planning on left cyroablation ?Procedure scheduled on 12/20/21  ? 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 ?Bp well controlled ?Tolerating cholesterol medication ?Good energy at baseline ?Reports hydrating well, no orthostasis symptoms ? ?Lab work reviewed ?Cholesterol at goal, normal renal function ? ?EKG personally reviewed by myself on todays visit ?Normal sinus rhythm  rate 65 bpm no significant ST-T wave changes ? ?Other past medical history reviewed ? ?Discussed previous CT scan January 2019 ?Ascending Aorta: Ascending aortic root dilatation 4.0 cm with atherosclerosis ?Coronary arteries: 3 vessel coronary artery calcium noted ?Coronary calcium score of 209 . This was 39 th percentile for age and sex matched control. ? ?Echo stress ?Treadmill showing low exercise tolerance ?No wall motion abnormality  concerning for ischemia ?Mildly dilated aortic root 38 mm ?  ? ? ?Past Medical History:  ?Diagnosis Date  ? Blood in stool   ? Hemorrhoids   ? Hyperlipidemia   ? Hypertension   ? Rectal fissure   ? Rectal pain   ? ?Past Surgical History:  ?Procedure Laterality Date  ? HERNIA REPAIR  2000 (approx)  ? IR RADIOLOGIST EVAL & MGMT  11/09/2021  ? lower back surgery  05/1995, 05/1998  ? SHOULDER SURGERY  1997  ? both  ?  ? ?Current Meds  ?Medication Sig  ? lisinopril-hydrochlorothiazide (ZESTORETIC) 10-12.5 MG tablet Take 1 tablet by mouth 2 (two) times daily.  ? metoprolol tartrate (LOPRESSOR) 25 MG tablet Take 1 tablet by mouth twice daily  ? Multiple Vitamins-Minerals (CENTRUM SILVER ADULT 50+ PO) Take 1 tablet by mouth daily.  ? simvastatin (ZOCOR) 20 MG tablet TAKE 1 TABLET BY MOUTH ONCE DAILY AT BEDTIME . APPOINTMENT REQUIRED FOR FUTURE REFILLS  ?  ? ?Allergies:   Doxycycline  ? ?Social History  ? ?Tobacco Use  ? Smoking status: Never  ? Smokeless tobacco: Never  ?Vaping Use  ? Vaping Use: Never used  ?Substance Use Topics  ? Alcohol use: No  ? Drug use: No  ?  ? ?Current Outpatient Medications on File Prior to Visit  ?Medication Sig Dispense Refill  ? lisinopril-hydrochlorothiazide (ZESTORETIC) 10-12.5 MG tablet Take 1 tablet by mouth 2 (two) times daily. 180 tablet 3  ? metoprolol tartrate (LOPRESSOR) 25 MG tablet Take 1 tablet by mouth twice daily 180 tablet 1  ?  Multiple Vitamins-Minerals (CENTRUM SILVER ADULT 50+ PO) Take 1 tablet by mouth daily.    ? simvastatin (ZOCOR) 20 MG tablet TAKE 1 TABLET BY MOUTH ONCE DAILY AT BEDTIME . APPOINTMENT REQUIRED FOR FUTURE REFILLS 90 tablet 3  ? ciprofloxacin (CIPRO) 500 MG tablet Take 500 mg by mouth 2 (two) times daily. (Patient not taking: Reported on 11/15/2021)    ? ?No current facility-administered medications on file prior to visit.  ?  ? ?Family Hx: ?The patient's family history is not on file. ? ?ROS:   ?Please see the history of present illness.    ?Review of  Systems  ?Constitutional: Negative.   ?HENT: Negative.    ?Respiratory: Negative.    ?Cardiovascular: Negative.   ?Gastrointestinal: Negative.   ?Musculoskeletal: Negative.   ?Neurological: Negative.   ?Psychiatric/Behavioral: Negative.    ?All other systems reviewed and are negative.  ? ?Labs/Other Tests and Data Reviewed:   ? ?Recent Labs: ?08/03/2021: Hemoglobin 15.0; Platelets 310 ?09/13/2021: ALT 23; BUN 18; Creatinine, Ser 1.12; Potassium 5.1; Sodium 127; TSH 1.92  ? ?Recent Lipid Panel ?Lab Results  ?Component Value Date/Time  ? CHOL 142 08/03/2021 08:02 AM  ? TRIG 91 08/03/2021 08:02 AM  ? HDL 55 08/03/2021 08:02 AM  ? CHOLHDL 2.6 08/03/2021 08:02 AM  ? LDLCALC 70 08/03/2021 08:02 AM  ? ? ?Wt Readings from Last 3 Encounters:  ?11/15/21 211 lb 4 oz (95.8 kg)  ?09/13/21 209 lb 12.8 oz (95.2 kg)  ?09/06/21 217 lb (98.4 kg)  ?  ? ?Exam:   ? ?BP 130/70 (BP Location: Left Arm, Patient Position: Sitting, Cuff Size: Normal)   Pulse 65   Ht '6\' 2"'$  (1.88 m)   Wt 211 lb 4 oz (95.8 kg)   BMI 27.12 kg/m?  ?Constitutional:  oriented to person, place, and time. No distress.  ?HENT:  ?Head: Grossly normal ?Eyes:  no discharge. No scleral icterus.  ?Neck: No JVD, no carotid bruits  ?Cardiovascular: Regular rate and rhythm, no murmurs appreciated ?Pulmonary/Chest: Clear to auscultation bilaterally, no wheezes or rails ?Abdominal: Soft.  no distension.  no tenderness.  ?Musculoskeletal: Normal range of motion ?Neurological:  normal muscle tone. Coordination normal. No atrophy ?Skin: Skin warm and dry ?Psychiatric: normal affect, pleasant ? ? ?ASSESSMENT & PLAN:   ? ?Preop cardiovascular evaluation ?Acceptable risk for cryoablation on the left in June 2023 ?No further cardiac testing needed ?Does not need to hold any medications ? ?Ascending aorta dilation (HCC) ? 4 cm in January 2019, mildly dilated ?In follow-up we will arrange for repeat imaging either through echocardiogram or repeat CT chest ? ?[place] coronary artery  calcium score between 200 and 399 ?Cholesterol at goal, non-smoker, no significant diabetes ? ?chest pain, unspecified type ?Denies any chest pain symptoms concerning for angina, walks 45 minutes daily ? ?dyspnea on exertion ?Denies any significant shortness of breath on exertion ?Reports feeling well with good energy ? ?Pure hypercholesterolemia ?Cholesterol is at goal on the current lipid regimen. No changes to the medications were made. ? ?Essential hypertension ?Staying hydrated ?Blood pressure stable ?Does not feel overmedicated, no orthostasis ? ? Total encounter time more than 30 minutes ? Greater than 50% was spent in counseling and coordination of care with the patient ? ? ?Signed, ?Ida Rogue, MD  ?11/15/2021 11:04 AM    ?[place] [facility] ?[address], [place], [place] [address] ? ? ? ? ?

## 2021-11-15 ENCOUNTER — Ambulatory Visit: Payer: Medicare Other | Admitting: Cardiovascular Disease

## 2021-11-15 ENCOUNTER — Encounter: Payer: Self-pay | Admitting: Cardiovascular Disease

## 2021-11-15 VITALS — BP 130/70 | HR 65 | Ht 74.0 in | Wt 211.2 lb

## 2021-11-15 DIAGNOSIS — R079 Chest pain, unspecified: Secondary | ICD-10-CM

## 2021-11-15 DIAGNOSIS — R931 Abnormal findings on diagnostic imaging of heart and coronary circulation: Secondary | ICD-10-CM | POA: Diagnosis not present

## 2021-11-15 DIAGNOSIS — E7800 Pure hypercholesterolemia, unspecified: Secondary | ICD-10-CM

## 2021-11-15 DIAGNOSIS — I77810 Thoracic aortic ectasia: Secondary | ICD-10-CM

## 2021-11-15 DIAGNOSIS — R0609 Other forms of dyspnea: Secondary | ICD-10-CM

## 2021-11-15 DIAGNOSIS — I10 Essential (primary) hypertension: Secondary | ICD-10-CM

## 2021-11-15 NOTE — Patient Instructions (Signed)
Medication Instructions:  No changes  If you need a refill on your cardiac medications before your next appointment, please call your pharmacy.   Lab work: No new labs needed  Testing/Procedures: No new testing needed  Follow-Up: At [facility], you and your health needs are our priority.  As part of our continuing mission to provide you with exceptional heart care, we have created designated Provider Care Teams.  These Care Teams include your primary Cardiologist (physician) and Advanced Practice Providers (APPs -  Physician Assistants and Nurse Practitioners) who all work together to provide you with the care you need, when you need it.  You will need a follow up appointment in 12 months  Providers on your designated Care Team:   Christopher Berge, NP Ryan Dunn, PA-C Cadence Furth, PA-C  COVID-19 Vaccine Information can be found at: [email or web address] For questions related to vaccine distribution or appointments, please email [email or web address] or call [phone].   

## 2021-11-30 ENCOUNTER — Other Ambulatory Visit: Payer: Self-pay | Admitting: Student

## 2021-11-30 DIAGNOSIS — N2889 Other specified disorders of kidney and ureter: Secondary | ICD-10-CM

## 2021-11-30 NOTE — Progress Notes (Signed)
Spoke with Amy to request orders in epic from Psychologist, sport and exercise.

## 2021-12-06 NOTE — Progress Notes (Addendum)
COVID Vaccine Completed:  Yes  Date of COVID positive in last 90 days:  No  PCP - Jenna Luo, MD Cardiologist - Ida Rogue, MD  Cardiac clearance in Epic dated 11-15-21 by Dr. Rockey Situ  Chest x-ray - N/A EKG - 11-15-21 Epic Stress Test - greater than 2 years ECHO - greater than 2 years Epic Cardiac Cath - N/A Pacemaker/ICD device last checked: Spinal Cord Stimulator: CT cardiac score - 2019 in Epic  Bowel Prep - N/A  Sleep Study - N/A CPAP -   Prediabetes Fasting Blood Sugar -  Checks Blood Sugar - does not check   Blood Thinner Instructions: Aspirin Instructions:  ASA 81.  Patient states that he will stop one week prior Last Dose:  Activity level:   Can go up a flight of stairs and perform activities of daily living without stopping and without symptoms of chest pain or shortness of breath.  Able to exercise without symptoms, patient walks and lifts weights daily  Anesthesia review: Ascending aorta dilation, hx of chest pain (no longer a problem per pt), HTN  Patient denies shortness of breath, fever, cough and chest pain at PAT appointment  Patient verbalized understanding of instructions that were given to them at the PAT appointment. Patient was also instructed that they will need to review over the PAT instructions again at home before surgery.

## 2021-12-06 NOTE — Patient Instructions (Addendum)
DUE TO COVID-19 ONLY TWO VISITORS  (aged 85 and older)  IS ALLOWED TO COME WITH YOU AND STAY IN THE WAITING ROOM ONLY DURING PRE OP AND PROCEDURE.   **NO VISITORS ARE ALLOWED IN THE SHORT STAY AREA OR RECOVERY ROOM!!**  IF YOU WILL BE ADMITTED INTO THE HOSPITAL YOU ARE ALLOWED ONLY FOUR SUPPORT PEOPLE DURING VISITATION HOURS ONLY (7 AM -8PM)   The support person(s) must pass our screening, gel in and out Visitors GUEST BADGE MUST BE WORN VISIBLY  One adult visitor may remain with you overnight and MUST be in the room by 8 P.M.   You are not required to [employer] often Do NOT share personal items Notify your provider if you are in close contact with someone who has COVID or you develop fever 100.4 or greater, new onset of sneezing, cough, sore throat, shortness of breath or body aches.        Your procedure is scheduled on:  12-20-21   Report to [facility] Main Entrance    Report to admitting at 6:15 AM   Call this number if you have problems the morning of surgery [phone]   Do not eat food or drink liquids after midnight :After Midnight the night before surgery            If you have questions, please contact your surgeon's office.   FOLLOW  ANY ADDITIONAL PRE OP INSTRUCTIONS YOU RECEIVED FROM YOUR SURGEON'S OFFICE!!!     Oral Hygiene is also important to reduce your risk of infection.                                    Remember - BRUSH YOUR TEETH THE MORNING OF SURGERY WITH YOUR REGULAR TOOTHPASTE   Do NOT smoke after Midnight   Take these medicines the morning of surgery with A SIP OF WATER:  Metoprolol   DO NOT [place]. PHARMACY WILL DISPENSE MEDICATIONS LISTED ON YOUR MEDICATION LIST TO YOU DURING YOUR ADMISSION [place]!                             You may not have any metal on your body including  jewelry, and body piercing             Do not wear  lotions, powders, cologne, or deodorant               Men may shave face and neck.    Contacts, dentures or bridgework may not be worn into surgery.   Bring small overnight bag day of surgery.  Do not bring valuables to the hospital. [place].   Please read over the following fact sheets you were given: IF YOU HAVE QUESTIONS ABOUT YOUR PRE-OP INSTRUCTIONS PLEASE CALL [place] - Preparing for Surgery Before surgery, you can play an important role.  Because skin is not sterile, your skin needs to be as free of germs as possible.  You can reduce the number of germs on your skin by washing with CHG (chlorahexidine gluconate) soap before surgery.  CHG is an antiseptic cleaner which kills germs and bonds with the skin to continue killing germs even after washing. Please DO NOT use if you have an allergy to CHG or antibacterial  soaps.  If your skin becomes reddened/irritated stop using the CHG and inform your nurse when you arrive at Short Stay. Do not shave (including legs and underarms) for at least 48 hours prior to the first CHG shower.  You may shave your face/neck.  Please follow these instructions carefully:  1.  Shower with CHG Soap the night before surgery and the  morning of surgery.  2.  If you choose to wash your hair, wash your hair first as usual with your normal  shampoo.  3.  After you shampoo, rinse your hair and body thoroughly to remove the shampoo.                             4.  Use CHG as you would any other liquid soap.  You can apply chg directly to the skin and wash.  Gently with a scrungie or clean washcloth.  5.  Apply the CHG Soap to your body ONLY FROM THE NECK DOWN.   Do   not use on face/ open                           Wound or open sores. Avoid contact with eyes, ears mouth and   genitals (private parts).                       Wash face,  Genitals (private parts) with your normal soap.             6.  Wash thoroughly, paying special attention to the area  where your    surgery  will be performed.  7.  Thoroughly rinse your body with warm water from the neck down.  8.  DO NOT shower/wash with your normal soap after using and rinsing off the CHG Soap.                9.  Pat yourself dry with a clean towel.            10.  Wear clean pajamas.            11.  Place clean sheets on your bed the night of your first shower and do not  sleep with pets. Day of Surgery : Do not apply any lotions/deodorants the morning of surgery.  Please wear clean clothes to the hospital/surgery center.  FAILURE TO FOLLOW THESE INSTRUCTIONS MAY RESULT IN THE CANCELLATION OF YOUR SURGERY  PATIENT SIGNATURE_________________________________  NURSE SIGNATURE__________________________________  ________________________________________________________________________          WHAT IS A BLOOD TRANSFUSION? Blood Transfusion Information  A transfusion is the replacement of blood or some of its parts. Blood is made up of multiple cells which provide different functions. Red blood cells carry oxygen and are used for blood loss replacement. White blood cells fight against infection. Platelets control bleeding. Plasma helps clot blood. Other blood products are available for specialized needs, such as hemophilia or other clotting disorders. BEFORE THE TRANSFUSION  Who gives blood for transfusions?  Healthy volunteers who are fully evaluated to make sure their blood is safe. This is blood bank blood. Transfusion therapy is the safest it has ever been in the practice of medicine. Before blood is taken from a donor, a complete history is taken to make sure that person has no history of diseases nor engages in risky social behavior (examples are intravenous drug use or  sexual activity with multiple partners). The donor's travel history is screened to minimize risk of transmitting infections, such as malaria. The donated blood is tested for signs of infectious diseases, such as  HIV and hepatitis. The blood is then tested to be sure it is compatible with you in order to minimize the chance of a transfusion reaction. If you or a relative donates blood, this is often done in anticipation of surgery and is not appropriate for emergency situations. It takes many days to process the donated blood. RISKS AND COMPLICATIONS Although transfusion therapy is very safe and saves many lives, the main dangers of transfusion include:  Getting an infectious disease. Developing a transfusion reaction. This is an allergic reaction to something in the blood you were given. Every precaution is taken to prevent this. The decision to have a blood transfusion has been considered carefully by your caregiver before blood is given. Blood is not given unless the benefits outweigh the risks. AFTER THE TRANSFUSION Right after receiving a blood transfusion, you will usually feel much better and more energetic. This is especially true if your red blood cells have gotten low (anemic). The transfusion raises the level of the red blood cells which carry oxygen, and this usually causes an energy increase. The nurse administering the transfusion will monitor you carefully for complications. HOME CARE INSTRUCTIONS  No special instructions are needed after a transfusion. You may find your energy is better. Speak with your caregiver about any limitations on activity for underlying diseases you may have. SEEK MEDICAL CARE IF:  Your condition is not improving after your transfusion. You develop redness or irritation at the intravenous (IV) site. SEEK IMMEDIATE MEDICAL CARE IF:  Any of the following symptoms occur over the next 12 hours: Shaking chills. You have a temperature by mouth above 102 F (38.9 C), not controlled by medicine. Chest, back, or muscle pain. People around you feel you are not acting correctly or are confused. Shortness of breath or difficulty breathing. Dizziness and fainting. You get a  rash or develop hives. You have a decrease in urine output. Your urine turns a dark color or changes to pink, red, or brown. Any of the following symptoms occur over the next 10 days: You have a temperature by mouth above 102 F (38.9 C), not controlled by medicine. Shortness of breath. Weakness after normal activity. The white part of the eye turns yellow (jaundice). You have a decrease in the amount of urine or are urinating less often. Your urine turns a dark color or changes to pink, red, or brown. Document Released: 06/15/2000 Document Revised: 09/10/2011 Document Reviewed: 02/02/2008 [facility] Patient Information 2014 [place], [place].  _______________________________________________________________________

## 2021-12-07 ENCOUNTER — Encounter (HOSPITAL_COMMUNITY)
Admission: RE | Admit: 2021-12-07 | Discharge: 2021-12-07 | Disposition: A | Discharge status: Home / Self Care | Payer: Medicare Other | Source: Ambulatory Visit | Attending: Diagnostic Radiology | Admitting: Diagnostic Radiology

## 2021-12-07 ENCOUNTER — Other Ambulatory Visit: Payer: Self-pay

## 2021-12-07 ENCOUNTER — Encounter (HOSPITAL_COMMUNITY): Payer: Self-pay

## 2021-12-07 VITALS — BP 145/76 | HR 65 | Temp 97.7°F | Resp 18 | Ht 74.0 in | Wt 213.2 lb

## 2021-12-07 DIAGNOSIS — N2889 Other specified disorders of kidney and ureter: Secondary | ICD-10-CM | POA: Insufficient documentation

## 2021-12-07 DIAGNOSIS — E119 Type 2 diabetes mellitus without complications: Secondary | ICD-10-CM | POA: Insufficient documentation

## 2021-12-07 DIAGNOSIS — I10 Essential (primary) hypertension: Secondary | ICD-10-CM | POA: Diagnosis not present

## 2021-12-07 DIAGNOSIS — Z01812 Encounter for preprocedural laboratory examination: Secondary | ICD-10-CM | POA: Diagnosis not present

## 2021-12-07 HISTORY — DX: Personal history of urinary calculi: Z87.442

## 2021-12-07 HISTORY — DX: Unspecified osteoarthritis, unspecified site: M19.90

## 2021-12-07 HISTORY — DX: Thoracic aortic ectasia: I77.810

## 2021-12-07 HISTORY — DX: Prediabetes: R73.03

## 2021-12-07 HISTORY — DX: Chronic kidney disease, unspecified: N18.9

## 2021-12-07 HISTORY — DX: Unspecified malignant neoplasm of skin, unspecified: C44.90

## 2021-12-07 LAB — BASIC METABOLIC PANEL
Anion gap: 5 (ref 5–15)
BUN: 20 mg/dL (ref 8–23)
CO2: 28 mmol/L (ref 22–32)
Calcium: 9.2 mg/dL (ref 8.9–10.3)
Chloride: 101 mmol/L (ref 98–111)
Creatinine, Ser: 0.93 mg/dL (ref 0.61–1.24)
GFR, Estimated: >60 mL/min (ref >60)
Glucose, Bld: 98 mg/dL (ref 70–99)
Potassium: 4.9 mmol/L (ref 3.5–5.1)
Sodium: 134 mmol/L — ABNORMAL LOW (ref 135–145)

## 2021-12-07 LAB — CBC WITH DIFFERENTIAL/PLATELET
Abs Immature Granulocytes: 0.02 10*3/uL (ref 0.00–0.07)
Basophils Absolute: 0.1 10*3/uL (ref 0.0–0.1)
Basophils Relative: 1 %
Eosinophils Absolute: 0.2 10*3/uL (ref 0.0–0.5)
Eosinophils Relative: 3 %
HCT: 44.2 % (ref 39.0–52.0)
Hemoglobin: 14.6 g/dL (ref 13.0–17.0)
Immature Granulocytes: 0 %
Lymphocytes Relative: 24 %
Lymphs Abs: 1.7 10*3/uL (ref 0.7–4.0)
MCH: 29.7 pg (ref 26.0–34.0)
MCHC: 33 g/dL (ref 30.0–36.0)
MCV: 90 fL (ref 80.0–100.0)
Monocytes Absolute: 0.7 10*3/uL (ref 0.1–1.0)
Monocytes Relative: 10 %
Neutro Abs: 4.4 10*3/uL (ref 1.7–7.7)
Neutrophils Relative %: 62 %
Platelets: 285 10*3/uL (ref 150–400)
RBC: 4.91 MIL/uL (ref 4.22–5.81)
RDW: 12.8 % (ref 11.5–15.5)
WBC: 7.2 10*3/uL (ref 4.0–10.5)
nRBC: 0 % (ref 0.0–0.2)

## 2021-12-07 LAB — HEMOGLOBIN A1C
Hgb A1c MFr Bld: 5.8 % — ABNORMAL HIGH (ref 4.8–5.6)
Mean Plasma Glucose: 119.76 mg/dL

## 2021-12-07 LAB — GLUCOSE, CAPILLARY: Glucose-Capillary: 119 mg/dL — ABNORMAL HIGH (ref 70–99)

## 2021-12-07 LAB — PROTIME-INR
INR: 1 (ref 0.8–1.2)
Prothrombin Time: 13.1 seconds (ref 11.4–15.2)

## 2021-12-08 ENCOUNTER — Encounter (HOSPITAL_COMMUNITY): Payer: Self-pay

## 2021-12-08 NOTE — Anesthesia Preprocedure Evaluation (Addendum)
Anesthesia Evaluation  Patient identified by MRN, date of birth, ID band Patient awake    Reviewed: Allergy & Precautions, NPO status , Patient's Chart, lab work & pertinent test results  Airway Mallampati: I  TM Distance: >3 FB Neck ROM: Full    Dental  (+) Teeth Intact, Dental Advisory Given   Pulmonary neg pulmonary ROS,    breath sounds clear to auscultation       Cardiovascular hypertension, Pt. on medications and Pt. on home beta blockers + CAD   Rhythm:Regular Rate:Normal     Neuro/Psych negative neurological ROS  negative psych ROS   GI/Hepatic negative GI ROS, Neg liver ROS,   Endo/Other  negative endocrine ROS  Renal/GU negative Renal ROS     Musculoskeletal  (+) Arthritis ,   Abdominal Normal abdominal exam  (+)   Peds  Hematology negative hematology ROS (+)   Anesthesia Other Findings   Reproductive/Obstetrics                           Anesthesia Physical Anesthesia Plan  ASA: 2  Anesthesia Plan: General   Post-op Pain Management:    Induction: Intravenous  PONV Risk Score and Plan: 2 and Ondansetron and Treatment may vary due to age or medical condition  Airway Management Planned: Oral ETT  Additional Equipment: None  Intra-op Plan:   Post-operative Plan: Extubation in OR  Informed Consent: I have reviewed the patients History and Physical, chart, labs and discussed the procedure including the risks, benefits and alternatives for the proposed anesthesia with the patient or authorized representative who has indicated his/her understanding and acceptance.     Dental advisory given  Plan Discussed with: CRNA  Anesthesia Plan Comments: (See APP note by Durel Salts, FNP )      Anesthesia Quick Evaluation

## 2021-12-08 NOTE — Progress Notes (Signed)
Anesthesia Chart Review:   Case: [id number] Date/Time: 12/20/21 0815   Procedure: CT WITH ANESTHESIA CRYOABLATION (Left)   Anesthesia type: General   Pre-op diagnosis: LEFT RENAL MASS   [facility]   Surgeons: Markus Daft, MD       DISCUSSION: Pt is 87 years old with hx HTN, ascending aorta dilatation (21m in 2019), pre-diabetes   VS: BP (!) 145/76   Pulse 65   Temp 36.5 C (Oral)   Resp 18   Ht '6\' 2"'$  (1.88 m)   Wt 96.7 kg   SpO2 100%   BMI 27.37 kg/m   PROVIDERS: - PCP is PSusy Frizzle MD - Cardiologist is TIda Rogue MD. Pt cleared for surgery at last office visit 11/15/21  LABS: Labs reviewed: Acceptable for surgery. (all labs ordered are listed, but only abnormal results are displayed)  Labs Reviewed  BASIC METABOLIC PANEL - Abnormal; Notable for the following components:      Result Value   Sodium 134 (*)    All other components within normal limits  HEMOGLOBIN A1C - Abnormal; Notable for the following components:   Hgb A1c MFr Bld 5.8 (*)    All other components within normal limits  GLUCOSE, CAPILLARY - Abnormal; Notable for the following components:   Glucose-Capillary 119 (*)    All other components within normal limits  CBC WITH DIFFERENTIAL/PLATELET  PROTIME-INR  TYPE AND SCREEN     IMAGES: MR abd 10/07/21:  1. Partially exophytic, heterogeneously, although poorly contrast enhancing mass of the lateral midportion of the left kidney measuring 2.0 x 1.8 cm. Findings are suspicious for a small, poorly enhancing renal cell carcinoma. 2. No evidence of renal vein invasion, lymphadenopathy, metastatic disease in the abdomen.   EKG 11/15/21: SR with 1st degree AV block   CV: CT cardiac scoring 07/17/17:  - Coronary calcium score of 209 . This was 311th percentile for age and sex matched control.  Stress echo 07/03/17:  - Stress ECG conclusions: There were no stress arrhythmias or conduction abnormalities. The stress ECG was negative for  ischemia.  - Staged echo: There was no echocardiographic evidence for stress-induced ischemia.  - Impressions: No ischemia. Mildly decreased exercise tolerance. Hypertensive response to exertion.   Echo 07/03/17:  - Left ventricle: The cavity size was normal. There was mild    concentric hypertrophy. Systolic function was normal. The    estimated ejection fraction was in the range of 60% to 65%. Wall    motion was normal; there were no regional wall motion    abnormalities. Doppler parameters are consistent with abnormal    left ventricular relaxation (grade 1 diastolic dysfunction).  - Aortic valve: Transvalvular velocity was within the normal range.    There was no stenosis. There was no regurgitation.  - Aorta: Aortic root dimension: 38 mm (ED).  - Ascending aorta: The ascending aorta was mildly dilated.  - Mitral valve: Transvalvular velocity was within the normal range.    There was no evidence for stenosis. There was trivial    regurgitation.  - Right ventricle: The cavity size was normal. Wall thickness was    normal. Systolic function was normal.  - Atrial septum: No defect or patent foramen ovale was identified.  - Tricuspid valve: There was no regurgitation.    Past Medical History:  Diagnosis Date   Arthritis    Ascending aorta dilation (HCC)    Blood in stool    Hemorrhoids  History of kidney stones    Hyperlipidemia    Hypertension    Pre-diabetes    Rectal fissure    Rectal pain    Skin cancer     Past Surgical History:  Procedure Laterality Date   HERNIA REPAIR  2000 (approx)   IR RADIOLOGIST EVAL & MGMT  11/09/2021   lower back surgery  05/1995, 05/1998   [place]   both    MEDICATIONS:  aspirin EC 81 MG tablet   lisinopril-hydrochlorothiazide (ZESTORETIC) 10-12.5 MG tablet   metoprolol tartrate (LOPRESSOR) 25 MG tablet   Multiple Vitamins-Minerals (CENTRUM SILVER ADULT 50+ PO)   simvastatin (ZOCOR) 20 MG tablet   No current  facility-administered medications for this encounter.    If no changes, I anticipate pt can proceed with surgery as scheduled.   Willeen Cass, PhD, FNP-BC [facility] Stay [facility]/Anesthesiology Phone: [phone] 12/08/2021 1:32 PM

## 2021-12-09 ENCOUNTER — Other Ambulatory Visit: Payer: Self-pay | Admitting: Family Medicine

## 2021-12-11 NOTE — Telephone Encounter (Signed)
Requested Prescriptions  Pending Prescriptions Disp Refills  . metoprolol tartrate (LOPRESSOR) 25 MG tablet [Pharmacy Med Name: Metoprolol Tartrate 25 MG Oral Tablet] 180 tablet 0    Sig: Take 1 tablet by mouth twice daily     Cardiovascular:  Beta Blockers Failed - 12/11/2021  1:59 PM      Failed - Last BP in normal range    BP Readings from Last 1 Encounters:  12/07/21 (!) 145/76         Passed - Last Heart Rate in normal range    Pulse Readings from Last 1 Encounters:  12/07/21 65         Passed - Valid encounter within last 6 months    Recent Outpatient Visits          3 months ago Constipation, unspecified constipation type   [place] Susy Frizzle, MD   3 months ago Constipation, unspecified constipation type   [place] Dennard Schaumann, Cammie Mcgee, MD   4 months ago Encounter for [employer] annual wellness exam   [place] Susy Frizzle, MD   5 months ago Viral URI   [place] Eulogio Bear, NP   1 year ago Benign essential HTN   [place] Pickard, Cammie Mcgee, MD

## 2021-12-14 ENCOUNTER — Ambulatory Visit
Admission: RE | Admit: 2021-12-14 | Discharge: 2021-12-14 | Disposition: A | Discharge status: Home / Self Care | Payer: Medicare Other | Source: Ambulatory Visit | Attending: Nurse Practitioner | Admitting: Nurse Practitioner

## 2021-12-14 VITALS — BP 114/70 | HR 72 | Temp 97.6°F | Resp 18

## 2021-12-14 DIAGNOSIS — S30860A Insect bite (nonvenomous) of lower back and pelvis, initial encounter: Secondary | ICD-10-CM | POA: Diagnosis not present

## 2021-12-14 DIAGNOSIS — W57XXXA Bitten or stung by nonvenomous insect and other nonvenomous arthropods, initial encounter: Secondary | ICD-10-CM

## 2021-12-14 MED ORDER — TRIAMCINOLONE ACETONIDE 0.1 % EX CREA: 1.0000 | TOPICAL_CREAM | Freq: Two times a day (BID) | CUTANEOUS | 0 refills | Status: DC

## 2021-12-14 NOTE — ED Triage Notes (Signed)
Tick bite since Tuesday.  States the area itches and is red.

## 2021-12-14 NOTE — Discharge Instructions (Addendum)
Apply medication as prescribed.  You can continue using the Neosporin as needed. Continue to monitor for symptoms of erythema migrans, which is the bull's-eye rash we discussed.  If you notice the rash or you develop fever, chills, generalized fatigue, persistent headache, or other concerns, please go to the emergency department immediately. Follow-up as needed.

## 2021-12-14 NOTE — ED Provider Notes (Signed)
[facility]    CSN: [id number] Arrival date & time: 12/14/21  1153      History   Chief Complaint Chief Complaint  Patient presents with   Insect Bite    Entered by patient    HPI Jesse Fletcher. is a 87 y.o. male.   The history is provided by the patient.   The patient is an 87 year old male who presents with a tick bite that occurred 2 days ago.  He is located on the left side of his upper back.  States the area is itchy and red.  He denies fever, chills, headache, fatigue, abdominal pain, or chest pain.  He has been using Neosporin to the site.  Past Medical History:  Diagnosis Date   Arthritis    Ascending aorta dilation (HCC)    Blood in stool    Hemorrhoids    History of kidney stones    Hyperlipidemia    Hypertension    Pre-diabetes    Rectal fissure    Rectal pain    Skin cancer     Patient Active Problem List   Diagnosis Date Noted   Change in bowel habits 09/13/2021   Constipation 09/13/2021   Loss of weight 09/13/2021   Ascending aorta dilation ([place]) 11/06/2018   [place] coronary artery calcium score between 200 and 399 11/06/2018   Chest pain 06/17/2017   Dyspnea on exertion 06/17/2017   Rectal bleeding 11/25/2013   Hyperlipidemia    Hypertension    Hemorrhoids 05/21/2011   Anal fissure 05/21/2011    Past Surgical History:  Procedure Laterality Date   HERNIA REPAIR  2000 (approx)   IR RADIOLOGIST EVAL & MGMT  11/09/2021   lower back surgery  05/1995, 05/1998   [place]   both       Home Medications    Prior to Admission medications   Medication Sig Start Date End Date Taking? Authorizing Provider  triamcinolone cream (KENALOG) 0.1 % Apply 1 Application topically 2 (two) times daily. 12/14/21  Yes Stuart Mirabile-Warren, Alda Lea, NP  aspirin EC 81 MG tablet Take 81 mg by mouth daily. Swallow whole.    [provider]  lisinopril-hydrochlorothiazide (ZESTORETIC) 10-12.5 MG tablet Take 1 tablet by mouth 2  (two) times daily. 07/26/21   Susy Frizzle, MD  metoprolol tartrate (LOPRESSOR) 25 MG tablet Take 1 tablet by mouth twice daily 12/11/21   Susy Frizzle, MD  Multiple Vitamins-Minerals (CENTRUM SILVER ADULT 50+ PO) Take 1 tablet by mouth daily.    [provider]  simvastatin (ZOCOR) 20 MG tablet TAKE 1 TABLET BY MOUTH ONCE DAILY AT BEDTIME . APPOINTMENT REQUIRED FOR FUTURE REFILLS 09/22/21   Susy Frizzle, MD    Family History History reviewed. No pertinent family history.  Social History Social History   Tobacco Use   Smoking status: Never   Smokeless tobacco: Never  Vaping Use   Vaping Use: Never used  Substance Use Topics   Alcohol use: No   Drug use: No     Allergies   Doxycycline   Review of Systems Review of Systems Per HPI  Physical Exam Triage Vital Signs ED Triage Vitals  Enc Vitals Group     BP 12/14/21 1206 114/70     Pulse Rate 12/14/21 1206 72     Resp 12/14/21 1206 18     Temp 12/14/21 1206 97.6 F (36.4 C)     Temp Source 12/14/21 1206 Oral  SpO2 12/14/21 1206 99 %     Weight --      Height --      Head Circumference --      Peak Flow --      Pain Score 12/14/21 1207 0     Pain Loc --      Pain Edu? --      Excl. in [place]? --    No data found.  Updated Vital Signs BP 114/70 (BP Location: Right Arm)   Pulse 72   Temp 97.6 F (36.4 C) (Oral)   Resp 18   SpO2 99%   Visual Acuity Right Eye Distance:   Left Eye Distance:   Bilateral Distance:    Right Eye Near:   Left Eye Near:    Bilateral Near:     Physical Exam Vitals and nursing note reviewed.  Constitutional:      General: He is not in acute distress.    Appearance: He is well-developed.  HENT:     Head: Normocephalic and atraumatic.  Eyes:     Conjunctiva/sclera: Conjunctivae normal.  Cardiovascular:     Rate and Rhythm: Normal rate and regular rhythm.     Heart sounds: No murmur heard. Pulmonary:     Effort: Pulmonary effort is normal. No  respiratory distress.     Breath sounds: Normal breath sounds.  Abdominal:     General: Bowel sounds are normal.     Palpations: Abdomen is soft.     Tenderness: There is no abdominal tenderness.  Musculoskeletal:        General: No swelling.     Cervical back: Neck supple.  Skin:    General: Skin is warm and dry.     Capillary Refill: Capillary refill takes less than 2 seconds.     Findings: Rash present. Rash is urticarial. Rash is not crusting, nodular, purpuric or scaling.          Comments: Induration to left upper back.  Area is mildly raised, base is erythematous.  No drainage or fluctuance present.  Neurological:     General: No focal deficit present.     Mental Status: He is alert and oriented to person, place, and time.  Psychiatric:        Mood and Affect: Mood normal.        Behavior: Behavior normal.      UC Treatments / Results  Labs (all labs ordered are listed, but only abnormal results are displayed) Labs Reviewed - No data to display  EKG   Radiology No results found.  Procedures Procedures (including critical care time)  Medications Ordered in UC Medications - No data to display  Initial Impression / Assessment and Plan / UC Course  I have reviewed the triage vital signs and the nursing notes.  Pertinent labs & imaging results that were available during my care of the patient were reviewed by me and considered in my medical decision making (see chart for details).  Patient presents for a tick bite to the left upper back.  He found a tick approximately 2 days ago and removed it.  He does not think it was attached for more than 3 days.  Patient presents with reddened area to his upper back.  Complains of itching.  Will provide symptomatic treatment with triamcinolone cream.  Patient advised he can continue use of Neosporin as needed.  Discussion with patient regarding erythema migrans and associated symptoms.  Patient was given strict precautions of  when  to go to the ER.  Patient advised to follow-up as needed. Final Clinical Impressions(s) / UC Diagnoses   Final diagnoses:  Tick bite of back, initial encounter     Discharge Instructions      Apply medication as prescribed.  You can continue using the Neosporin as needed. Continue to monitor for symptoms of erythema migrans, which is the bull's-eye rash we discussed.  If you notice the rash or you develop fever, chills, generalized fatigue, persistent headache, or other concerns, please go to the emergency department immediately. Follow-up as needed.     ED Prescriptions     Medication Sig Dispense Auth. Provider   triamcinolone cream (KENALOG) 0.1 % Apply 1 Application topically 2 (two) times daily. 30 g Sarra Rachels-Warren, Alda Lea, NP      PDMP not reviewed this encounter.   Tish Men, NP 12/14/21 1231

## 2021-12-19 ENCOUNTER — Ambulatory Visit: Payer: Medicare Other | Admitting: Cardiovascular Disease

## 2021-12-19 ENCOUNTER — Other Ambulatory Visit: Payer: Self-pay | Admitting: Internal Medicine

## 2021-12-19 ENCOUNTER — Other Ambulatory Visit: Payer: Self-pay | Admitting: Student

## 2021-12-19 DIAGNOSIS — N2889 Other specified disorders of kidney and ureter: Secondary | ICD-10-CM

## 2021-12-19 NOTE — H&P (Signed)
Chief Complaint: Patient was seen in consultation today for left renal lesion   Referring Physician(s): Dr. Alinda Money  Supervising Physician: {Supervising Physician:[id number]}  Patient Status: [facility] - Out-pt  History of Present Illness: Jesse Fletcher. is a 87 y.o. male with a medical history significant for  The patient met with Dr. Anselm Pancoast 11/09/21 to discuss possible treatment and management options including active surveillance, biopsy and percutaneous ablation. Based on the size and location of the lesion the lesion is amenable to percutaneous cryoablation. It was discussed that this procedure would need to be done under general anesthesia and with overnight observation due to the patient's advanced age and history of urinary retention. The patient and his daughter were in agreement to proceed with this option.   Past Medical History:  Diagnosis Date   Arthritis    Ascending aorta dilation (HCC)    Blood in stool    Hemorrhoids    History of kidney stones    Hyperlipidemia    Hypertension    Pre-diabetes    Rectal fissure    Rectal pain    Skin cancer     Past Surgical History:  Procedure Laterality Date   HERNIA REPAIR  2000 (approx)   IR RADIOLOGIST EVAL & MGMT  11/09/2021   lower back surgery  05/1995, 05/1998   SHOULDER SURGERY  1997   both    Allergies: Doxycycline  Medications: Prior to Admission medications   Medication Sig Start Date End Date Taking? Authorizing Provider  aspirin EC 81 MG tablet Take 81 mg by mouth daily. Swallow whole.   Yes [provider]  lisinopril-hydrochlorothiazide (ZESTORETIC) 10-12.5 MG tablet Take 1 tablet by mouth 2 (two) times daily. 07/26/21  Yes Susy Frizzle, MD  Multiple Vitamins-Minerals (CENTRUM SILVER ADULT 50+ PO) Take 1 tablet by mouth daily.   Yes [provider]  simvastatin (ZOCOR) 20 MG tablet TAKE 1 TABLET BY MOUTH ONCE DAILY AT BEDTIME . APPOINTMENT REQUIRED FOR FUTURE REFILLS 09/22/21  Yes  Susy Frizzle, MD  metoprolol tartrate (LOPRESSOR) 25 MG tablet Take 1 tablet by mouth twice daily 12/11/21   Susy Frizzle, MD  triamcinolone cream (KENALOG) 0.1 % Apply 1 Application topically 2 (two) times daily. 12/14/21   Leath-Warren, Alda Lea, NP     No family history on file.  Social History   Socioeconomic History   Marital status: Widowed    Spouse name: Not on file   Number of children: Not on file   Years of education: Not on file   Highest education level: Not on file  Occupational History   Not on file  Tobacco Use   Smoking status: Never   Smokeless tobacco: Never  Vaping Use   Vaping Use: Never used  Substance and Sexual Activity   Alcohol use: No   Drug use: No   Sexual activity: Not on file  Other Topics Concern   Not on file  Social History Narrative   Not on file   Social Determinants of Health   Financial Resource Strain: Low Risk  (04/21/2021)   Overall Financial Resource Strain (CARDIA)    Difficulty of Paying Living Expenses: Not hard at all  Food Insecurity: No Food Insecurity (04/21/2021)   Hunger Vital Sign    Worried About Running Out of Food in the Last Year: Never true    Ran Out of Food in the Last Year: Never true  Transportation Needs: No Transportation Needs (04/21/2021)   [place] - Transportation  Lack of Transportation (Medical): No    Lack of Transportation (Non-Medical): No  Physical Activity: Sufficiently Active (04/21/2021)   Exercise Vital Sign    Days of Exercise per Week: 5 days    Minutes of Exercise per Session: 50 min  Stress: No Stress Concern Present (04/21/2021)   [place]    Feeling of Stress : Not at all  Social Connections: Moderately Integrated (04/21/2021)   Social Connection and Isolation Panel [NHANES]    Frequency of Communication with Friends and Family: More than three times a week    Frequency of Social Gatherings with Friends  and Family: More than three times a week    Attends Religious Services: More than 4 times per year    Active Member of [employer] or Organizations: Yes    Attends Archivist Meetings: More than 4 times per year    Marital Status: Widowed    Review of Systems: A 12 point ROS discussed and pertinent positives are indicated in the HPI above.  All other systems are negative.  Review of Systems  Vital Signs: There were no vitals taken for this visit.  Physical Exam  Imaging: No results found.  Labs:  CBC: Recent Labs    08/03/21 0802 12/07/21 1128  WBC 5.9 7.2  HGB 15.0 14.6  HCT 45.4 44.2  PLT 310 285    COAGS: Recent Labs    12/07/21 1128  INR 1.0    BMP: Recent Labs    08/03/21 0802 09/13/21 1226 12/07/21 1128  NA 134* 127* 134*  K 4.4 5.1 4.9  CL 97* 93* 101  CO2 _0 GLUCOSE 104* 112* 98  BUN _1 CALCIUM 9.5 9.6 9.2  CREATININE 1.11 1.12 0.93  GFRNONAA  --   --  >60    LIVER FUNCTION TESTS: Recent Labs    08/03/21 0802 09/13/21 1226  BILITOT 0.8 0.8  AST 19 22  ALT 16 23  ALKPHOS  --  58  PROT 6.4 6.5  ALBUMIN  --  4.1    TUMOR MARKERS: No results for input(s): "AFPTM", "CEA", "CA199", "CHROMGRNA" in the last 8760 hours.  Assessment and Plan:  Left renal lesion suspicious for renal cell carcinoma: Simonne Come. Jesse Gear., 87 year old male, presents today to the [facility] department for an image-guided left renal lesion cryoablation with biopsy. This procedure will be performed under anesthesia with planned overnight observation.  Risks and benefits of image guided renal cryoablation was discussed with the patient including, but not limited to, failure to treat entire lesion, bleeding, infection, damage to adjacent structures, hematuria, urine leak, decrease in renal function or post procedural neuropathy.  All of the patient's questions were answered and the patient is agreeable to proceed. He has  been NPO. Labs and vitals have been reviewed.   Consent signed and in chart.  Thank you for this interesting consult.  I greatly enjoyed meeting Mariana Goytia. and look forward to participating in their care.  A copy of this report was sent to the requesting provider on this date.  Electronically Signed: Soyla Dryer, AGACNP-BC [phone] 12/19/2021, 4:48 PM   I spent a total of 20 Minutes    in face to face in clinical consultation, greater than 50% of which was counseling/coordinating care for renal lesion cryoablation.

## 2021-12-20 ENCOUNTER — Ambulatory Visit (HOSPITAL_COMMUNITY): Payer: Medicare Other | Admitting: Emergency Medicine

## 2021-12-20 ENCOUNTER — Ambulatory Visit (HOSPITAL_COMMUNITY)
Admission: RE | Admit: 2021-12-20 | Discharge: 2021-12-20 | Disposition: A | Discharge status: Home / Self Care | Payer: Medicare Other | Source: Ambulatory Visit | Attending: Diagnostic Radiology | Admitting: Diagnostic Radiology

## 2021-12-20 ENCOUNTER — Ambulatory Visit (HOSPITAL_COMMUNITY)
Admission: RE | Admit: 2021-12-20 | Discharge: 2021-12-20 | Disposition: A | Discharge status: Home / Self Care | Payer: Medicare Other | Attending: Diagnostic Radiology | Admitting: Diagnostic Radiology

## 2021-12-20 ENCOUNTER — Ambulatory Visit (HOSPITAL_BASED_OUTPATIENT_CLINIC_OR_DEPARTMENT_OTHER): Payer: Medicare Other | Admitting: Certified Registered Nurse Anesthetist

## 2021-12-20 ENCOUNTER — Encounter (HOSPITAL_COMMUNITY)
Admission: RE | Disposition: A | Discharge status: Home / Self Care | Payer: Self-pay | Source: Home / Self Care | Attending: Diagnostic Radiology

## 2021-12-20 ENCOUNTER — Encounter (HOSPITAL_COMMUNITY): Payer: Self-pay | Admitting: Diagnostic Radiology

## 2021-12-20 ENCOUNTER — Other Ambulatory Visit: Payer: Self-pay

## 2021-12-20 DIAGNOSIS — N2889 Other specified disorders of kidney and ureter: Secondary | ICD-10-CM

## 2021-12-20 DIAGNOSIS — N289 Disorder of kidney and ureter, unspecified: Secondary | ICD-10-CM | POA: Diagnosis not present

## 2021-12-20 DIAGNOSIS — I10 Essential (primary) hypertension: Secondary | ICD-10-CM | POA: Insufficient documentation

## 2021-12-20 DIAGNOSIS — C642 Malignant neoplasm of left kidney, except renal pelvis: Secondary | ICD-10-CM | POA: Insufficient documentation

## 2021-12-20 DIAGNOSIS — E119 Type 2 diabetes mellitus without complications: Secondary | ICD-10-CM

## 2021-12-20 DIAGNOSIS — E785 Hyperlipidemia, unspecified: Secondary | ICD-10-CM | POA: Insufficient documentation

## 2021-12-20 HISTORY — PX: RADIOLOGY WITH ANESTHESIA: SHX6223

## 2021-12-20 LAB — CBC WITH DIFFERENTIAL/PLATELET
Abs Immature Granulocytes: 0.02 10*3/uL (ref 0.00–0.07)
Basophils Absolute: 0.1 10*3/uL (ref 0.0–0.1)
Basophils Relative: 1 %
Eosinophils Absolute: 0.4 10*3/uL (ref 0.0–0.5)
Eosinophils Relative: 4 %
HCT: 43.6 % (ref 39.0–52.0)
Hemoglobin: 14.6 g/dL (ref 13.0–17.0)
Immature Granulocytes: 0 %
Lymphocytes Relative: 28 %
Lymphs Abs: 2.4 10*3/uL (ref 0.7–4.0)
MCH: 29.9 pg (ref 26.0–34.0)
MCHC: 33.5 g/dL (ref 30.0–36.0)
MCV: 89.2 fL (ref 80.0–100.0)
Monocytes Absolute: 0.8 10*3/uL (ref 0.1–1.0)
Monocytes Relative: 10 %
Neutro Abs: 4.7 10*3/uL (ref 1.7–7.7)
Neutrophils Relative %: 57 %
Platelets: 326 10*3/uL (ref 150–400)
RBC: 4.89 MIL/uL (ref 4.22–5.81)
RDW: 12.7 % (ref 11.5–15.5)
WBC: 8.4 10*3/uL (ref 4.0–10.5)
nRBC: 0 % (ref 0.0–0.2)

## 2021-12-20 LAB — TYPE AND SCREEN
ABO/RH(D): A POS
ABO/RH(D): A POS
Antibody Screen: NEGATIVE
Antibody Screen: NEGATIVE

## 2021-12-20 LAB — BASIC METABOLIC PANEL
Anion gap: 9 (ref 5–15)
BUN: 29 mg/dL — ABNORMAL HIGH (ref 8–23)
CO2: 26 mmol/L (ref 22–32)
Calcium: 9 mg/dL (ref 8.9–10.3)
Chloride: 101 mmol/L (ref 98–111)
Creatinine, Ser: 1.27 mg/dL — ABNORMAL HIGH (ref 0.61–1.24)
GFR, Estimated: 55 mL/min — ABNORMAL LOW (ref >60)
Glucose, Bld: 105 mg/dL — ABNORMAL HIGH (ref 70–99)
Potassium: 3.7 mmol/L (ref 3.5–5.1)
Sodium: 136 mmol/L (ref 135–145)

## 2021-12-20 LAB — GLUCOSE, CAPILLARY: Glucose-Capillary: 102 mg/dL — ABNORMAL HIGH (ref 70–99)

## 2021-12-20 LAB — PROTIME-INR
INR: 1 (ref 0.8–1.2)
Prothrombin Time: 13.3 seconds (ref 11.4–15.2)

## 2021-12-20 SURGERY — CT WITH ANESTHESIA
Anesthesia: General | Laterality: Left

## 2021-12-20 MED ORDER — DEXAMETHASONE SODIUM PHOSPHATE 4 MG/ML IJ SOLN
INTRAMUSCULAR | Status: DC | PRN
  Administered 2021-12-20: 8 mg via INTRAVENOUS

## 2021-12-20 MED ORDER — FENTANYL CITRATE (PF) 250 MCG/5ML IJ SOLN
INTRAMUSCULAR | Status: AC
  Filled 2021-12-20: qty 5

## 2021-12-20 MED ORDER — EPHEDRINE SULFATE (PRESSORS) 50 MG/ML IJ SOLN
INTRAMUSCULAR | Status: DC | PRN
  Administered 2021-12-20 (×4): 5 mg via INTRAVENOUS

## 2021-12-20 MED ORDER — LABETALOL HCL 5 MG/ML IV SOLN
INTRAVENOUS | Status: DC | PRN
  Administered 2021-12-20 (×2): 5 mg via INTRAVENOUS

## 2021-12-20 MED ORDER — SUGAMMADEX SODIUM 500 MG/5ML IV SOLN
INTRAVENOUS | Status: DC | PRN
  Administered 2021-12-20: 200 mg via INTRAVENOUS

## 2021-12-20 MED ORDER — PHENYLEPHRINE 80 MCG/ML (10ML) SYRINGE FOR IV PUSH (FOR BLOOD PRESSURE SUPPORT)
PREFILLED_SYRINGE | INTRAVENOUS | Status: DC | PRN
  Administered 2021-12-20: 120 ug via INTRAVENOUS
  Administered 2021-12-20 (×2): 80 ug via INTRAVENOUS

## 2021-12-20 MED ORDER — FENTANYL CITRATE (PF) 100 MCG/2ML IJ SOLN
INTRAMUSCULAR | Status: DC | PRN
  Administered 2021-12-20 (×4): 50 ug via INTRAVENOUS

## 2021-12-20 MED ORDER — LIDOCAINE 2% (20 MG/ML) 5 ML SYRINGE
INTRAMUSCULAR | Status: DC | PRN
  Administered 2021-12-20: 60 mg via INTRAVENOUS

## 2021-12-20 MED ORDER — PROPOFOL 10 MG/ML IV BOLUS
INTRAVENOUS | Status: DC | PRN
  Administered 2021-12-20: 110 mg via INTRAVENOUS

## 2021-12-20 MED ORDER — SODIUM CHLORIDE 0.9 % IV SOLN
INTRAVENOUS | Status: AC
  Filled 2021-12-20: qty 500

## 2021-12-20 MED ORDER — HYDROCODONE-ACETAMINOPHEN 5-325 MG PO TABS: 1.0000 | ORAL_TABLET | Freq: Four times a day (QID) | ORAL | 0 refills | Status: AC | PRN

## 2021-12-20 MED ORDER — ROCURONIUM BROMIDE 10 MG/ML (PF) SYRINGE
PREFILLED_SYRINGE | INTRAVENOUS | Status: DC | PRN
  Administered 2021-12-20 (×2): 50 mg via INTRAVENOUS
  Administered 2021-12-20: 20 mg via INTRAVENOUS

## 2021-12-20 MED ORDER — AMISULPRIDE (ANTIEMETIC) 5 MG/2ML IV SOLN: 10.0000 mg | Freq: Once | INTRAVENOUS | Status: DC | PRN

## 2021-12-20 MED ORDER — SODIUM CHLORIDE 0.9 % IV SOLN
INTRAVENOUS | Status: AC
  Filled 2021-12-20: qty 250

## 2021-12-20 MED ORDER — CHLORHEXIDINE GLUCONATE 0.12 % MT SOLN
15.0000 mL | Freq: Once | OROMUCOSAL | Status: DC
  Filled 2021-12-20: qty 15

## 2021-12-20 MED ORDER — SODIUM CHLORIDE 0.9 % IV SOLN
2.0000 g | Freq: Once | INTRAVENOUS | Status: AC
  Administered 2021-12-20: 2 g via INTRAVENOUS
  Filled 2021-12-20: qty 2

## 2021-12-20 MED ORDER — SODIUM CHLORIDE (PF) 0.9 % IJ SOLN
INTRAMUSCULAR | Status: AC
  Filled 2021-12-20: qty 50

## 2021-12-20 MED ORDER — ORAL CARE MOUTH RINSE: 15.0000 mL | Freq: Once | OROMUCOSAL | Status: DC

## 2021-12-20 MED ORDER — LACTATED RINGERS IV SOLN: INTRAVENOUS | Status: DC

## 2021-12-20 MED ORDER — SODIUM CHLORIDE 0.9 % IV SOLN: INTRAVENOUS | Status: DC

## 2021-12-20 MED ORDER — ONDANSETRON HCL 4 MG/2ML IJ SOLN
INTRAMUSCULAR | Status: DC | PRN
  Administered 2021-12-20: 4 mg via INTRAVENOUS

## 2021-12-20 MED ORDER — ONDANSETRON HCL 4 MG/2ML IJ SOLN: 4.0000 mg | Freq: Once | INTRAMUSCULAR | Status: DC | PRN

## 2021-12-20 NOTE — Anesthesia Procedure Notes (Addendum)
Procedure Name: Intubation Date/Time: 12/20/2021 8:52 AM  Performed by: Deliah Boston, CRNAPre-anesthesia Checklist: Patient identified, Emergency Drugs available, Suction available and Patient being monitored Patient Re-evaluated:Patient Re-evaluated prior to induction Oxygen Delivery Method: Circle system utilized Preoxygenation: Pre-oxygenation with 100% oxygen Induction Type: IV induction Ventilation: Mask ventilation without difficulty Laryngoscope Size: Mac and 4 Grade View: Grade I Tube type: Oral Tube size: 7.5 mm Number of attempts: 1 Airway Equipment and Method: Stylet (roll gauze placed in mouth) Placement Confirmation: ETT inserted through vocal cords under direct vision, positive ETCO2 and breath sounds checked- equal and bilateral Secured at: 23 cm Tube secured with: Tape Dental Injury: Teeth and Oropharynx as per pre-operative assessment

## 2021-12-20 NOTE — Anesthesia Postprocedure Evaluation (Signed)
Anesthesia Post Note  Patient: Jesse Fletcher.  Procedure(s) Performed: CT WITH ANESTHESIA CRYOABLATION (Left)     Patient location during evaluation: PACU Anesthesia Type: General Level of consciousness: awake and alert Pain management: pain level controlled Vital Signs Assessment: post-procedure vital signs reviewed and stable Respiratory status: spontaneous breathing, nonlabored ventilation, respiratory function stable and patient connected to nasal cannula oxygen Cardiovascular status: blood pressure returned to baseline and stable Postop Assessment: no apparent nausea or vomiting Anesthetic complications: no   No notable events documented.  Last Vitals:  Vitals:   12/20/21 1300 12/20/21 1400  BP: (!) 164/82 (!) 156/81  Pulse: 68 75  Resp:  12  Temp:    SpO2: 95% 93%    Last Pain:  Vitals:   12/20/21 1400  TempSrc:   PainSc: [place] Margert Edsall

## 2021-12-20 NOTE — Discharge Summary (Signed)
Patient ID: Jesse Fletcher. MRN: [id number] DOB/AGE: 85-Jul-1936 85 y.o.  Admit date: 12/20/2021 Discharge date: 12/20/2021  Supervising Physician: Markus Daft  Patient Status: [facility] - In-pt  Admission Diagnoses: Left renal lesion   Discharge Diagnoses:  Active Problems:   Renal lesion   Discharged Condition: good  Hospital Course:   Patient presented today for an elective procedure to treat a left renal lesion. He underwent percutaneous cryoablation with biopsy under general anesthesia. He was initially scheduled to remain at the hospital for overnight observation but the patient was doing remarkably well post-procedure and elected to go home.   The patient fulfilled his bed rest requirements, he has ambulated, eaten and voided. He denies any pain or discomfort. His family members are supportive of the patient going home. He will follow up with Dr. Anselm Pancoast in 2-3 weeks for a post-procedure visit. A prescription for Norco has been e-prescribed to the patient's pharmacy. He was encouraged to take tylenol for moderate pain and to take Norco if he has severe pain. The patient and his family know to call IR if the patient develops any blood in his urine, difficulty urinating or flank pain unresponsive to pain medication.   Consults: Anesthesia   Significant Diagnostic Studies: No results found.  Treatments: See procedure note.   Discharge Exam: Blood pressure (!) 140/95, pulse 89, temperature 97.6 F (36.4 C), resp. rate 14, SpO2 94 %. Physical Exam Constitutional:      General: He is not in acute distress.    Appearance: He is not ill-appearing.  Pulmonary:     Effort: Pulmonary effort is normal.  Genitourinary:    Comments: Urine is clear yellow Musculoskeletal:        General: Normal range of motion.     Comments: Left back/flank procedure site is non-tender. Dressing clean, dry and intact.   Skin:    General: Skin is warm and dry.  Neurological:     Mental Status: He is  alert and oriented to person, place, and time.     Disposition: Discharge disposition: 01-Home or Self Care   Allergies as of 12/20/2021       Reactions   Doxycycline    GI upset         Medication List     TAKE these medications    aspirin EC 81 MG tablet Take 81 mg by mouth daily. Swallow whole.   CENTRUM SILVER ADULT 50+ PO Take 1 tablet by mouth daily.   HYDROcodone-acetaminophen 5-325 MG tablet Commonly known as: Norco Take 1 tablet by mouth every 6 (six) hours as needed for up to 3 days for moderate pain or severe pain.   lisinopril-hydrochlorothiazide 10-12.5 MG tablet Commonly known as: ZESTORETIC Take 1 tablet by mouth 2 (two) times daily.   metoprolol tartrate 25 MG tablet Commonly known as: LOPRESSOR Take 1 tablet by mouth twice daily   simvastatin 20 MG tablet Commonly known as: ZOCOR TAKE 1 TABLET BY MOUTH ONCE DAILY AT BEDTIME . APPOINTMENT REQUIRED FOR FUTURE REFILLS   triamcinolone cream 0.1 % Commonly known as: KENALOG Apply 1 Application topically 2 (two) times daily.        Follow-up Information     [facility] Follow up.   Why: Follow up with Dr. Anselm Pancoast in 2-3 weeks. A scheduler from our office will call you with a date/time of your appointment. Please call our office if you have any questions or concerns prior to your visit. Contact information:  [place] [address] [phone]                  Electronically Signed: Theresa Duty, NP 12/20/2021, 4:22 PM   I have spent Less Than 30 Minutes discharging Jesse Fletcher.Marland Kitchen

## 2021-12-20 NOTE — Transfer of Care (Signed)
Immediate Anesthesia Transfer of Care Note  Patient: Jesse Fletcher.  Procedure(s) Performed: CT WITH ANESTHESIA CRYOABLATION (Left)  Patient Location: PACU  Anesthesia Type:General  Level of Consciousness: awake, alert  and oriented  Airway & Oxygen Therapy: Patient Spontanous Breathing and Patient connected to face mask oxygen  Post-op Assessment: Report given to RN, Post -op Vital signs reviewed and stable and Patient moving all extremities X 4  Post vital signs: Reviewed and stable  Last Vitals:  Vitals Value Taken Time  BP 144/80   Temp    Pulse 77   Resp    SpO2 100     Last Pain:  Vitals:   12/20/21 0658  TempSrc: Oral         Complications: No notable events documented.

## 2021-12-20 NOTE — Procedures (Signed)
Interventional Radiology Procedure:   Indications: Suspicious left renal lesion  Procedure: CT guided biopsy and cryoablation of left renal lesion  Findings: 2 cryoablation probes placed in left renal lesion.  Hydro dissection performed between left kidney and colon.  Two 10 minutes freezes performed.    Complications: No immediate complications noted.     EBL: Minimal  Plan: Recover in PACU and assess for discharge vs overnight observation   Sharleen Szczesny R. Anselm Pancoast, MD  Pager: [phone]

## 2021-12-21 ENCOUNTER — Encounter (HOSPITAL_COMMUNITY): Payer: Self-pay | Admitting: Diagnostic Radiology

## 2021-12-21 LAB — SURGICAL PATHOLOGY

## 2022-01-10 ENCOUNTER — Inpatient Hospital Stay: Admission: RE | Admit: 2022-01-10 | Payer: Self-pay | Source: Ambulatory Visit

## 2022-01-10 ENCOUNTER — Encounter: Payer: Self-pay | Admitting: Emergency Medicine

## 2022-01-10 ENCOUNTER — Ambulatory Visit (INDEPENDENT_AMBULATORY_CARE_PROVIDER_SITE_OTHER): Payer: Medicare Other

## 2022-01-10 ENCOUNTER — Ambulatory Visit
Admission: EM | Admit: 2022-01-10 | Discharge: 2022-01-10 | Disposition: A | Discharge status: Home / Self Care | Payer: Medicare Other | Attending: Nurse Practitioner | Admitting: Nurse Practitioner

## 2022-01-10 ENCOUNTER — Other Ambulatory Visit: Payer: Self-pay

## 2022-01-10 DIAGNOSIS — S61211A Laceration without foreign body of left index finger without damage to nail, initial encounter: Secondary | ICD-10-CM

## 2022-01-10 DIAGNOSIS — M79645 Pain in left finger(s): Secondary | ICD-10-CM | POA: Diagnosis not present

## 2022-01-10 DIAGNOSIS — S6992XA Unspecified injury of left wrist, hand and finger(s), initial encounter: Secondary | ICD-10-CM | POA: Diagnosis not present

## 2022-01-10 NOTE — ED Provider Notes (Signed)
[facility]    CSN: [id number] Arrival date & time: 01/10/22  1435      History   Chief Complaint Chief Complaint  Patient presents with   Laceration    HPI Jesse Fletcher. is a 87 y.o. male.   The history is provided by the patient.   Patient presents with lacerations to the left index finger.  Injury occurred 1 day ago around 5 PM.  Patient with lacerations to the anterior left index finger, and 2 lacerations to the palmar aspect of the left index finger of the first digit.  Injury occurred after he was trying to pull down a garage door and the left index finger became pinched between the door and the floor of the garage.  Patient went to his PCP office where the area was cleaned with peroxide.  Bleeding continues to be controlled at this time.  Patient denies fever, chills, foul-smelling drainage, increased redness, drainage, or oozing.  He is unsure of when he had his last tetanus shot.  He currently takes aspirin daily.  Past Medical History:  Diagnosis Date   Arthritis    Ascending aorta dilation (HCC)    Blood in stool    Hemorrhoids    History of kidney stones    Hyperlipidemia    Hypertension    Pre-diabetes    Rectal fissure    Rectal pain    Skin cancer     Patient Active Problem List   Diagnosis Date Noted   Renal lesion 12/20/2021   Change in bowel habits 09/13/2021   Constipation 09/13/2021   Loss of weight 09/13/2021   Ascending aorta dilation ([place]) 11/06/2018   Agatston coronary artery calcium score between 200 and 399 11/06/2018   Chest pain 06/17/2017   Dyspnea on exertion 06/17/2017   Rectal bleeding 11/25/2013   Hyperlipidemia    Hypertension    Hemorrhoids 05/21/2011   Anal fissure 05/21/2011    Past Surgical History:  Procedure Laterality Date   HERNIA REPAIR  2000 (approx)   IR RADIOLOGIST EVAL & MGMT  11/09/2021   lower back surgery  05/1995, 05/1998   RADIOLOGY WITH ANESTHESIA Left 12/20/2021   Procedure: CT WITH  ANESTHESIA CRYOABLATION;  Surgeon: Markus Daft, MD;  Location: [facility];  Service: Anesthesiology;  Laterality: Left;   SHOULDER SURGERY  1997   both       Home Medications    Prior to Admission medications   Medication Sig Start Date End Date Taking? Authorizing Provider  aspirin EC 81 MG tablet Take 81 mg by mouth daily. Swallow whole.    [provider]  lisinopril-hydrochlorothiazide (ZESTORETIC) 10-12.5 MG tablet Take 1 tablet by mouth 2 (two) times daily. 07/26/21   Susy Frizzle, MD  metoprolol tartrate (LOPRESSOR) 25 MG tablet Take 1 tablet by mouth twice daily 12/11/21   Susy Frizzle, MD  Multiple Vitamins-Minerals (CENTRUM SILVER ADULT 50+ PO) Take 1 tablet by mouth daily.    [provider]  simvastatin (ZOCOR) 20 MG tablet TAKE 1 TABLET BY MOUTH ONCE DAILY AT BEDTIME . APPOINTMENT REQUIRED FOR FUTURE REFILLS 09/22/21   Susy Frizzle, MD  triamcinolone cream (KENALOG) 0.1 % Apply 1 Application topically 2 (two) times daily. 12/14/21   Beckhem Isadore-Warren, Alda Lea, NP    Family History History reviewed. No pertinent family history.  Social History Social History   Tobacco Use   Smoking status: Never   Smokeless tobacco: Never  Vaping Use   Vaping Use:  Never used  Substance Use Topics   Alcohol use: No   Drug use: No     Allergies   Doxycycline   Review of Systems Review of Systems Per HPI  Physical Exam Triage Vital Signs ED Triage Vitals  Enc Vitals Group     BP 01/10/22 1458 102/65     Pulse --      Resp 01/10/22 1458 18     Temp 01/10/22 1458 98.8 F (37.1 C)     Temp Source 01/10/22 1458 Oral     SpO2 01/10/22 1458 94 %     Weight --      Height --      Head Circumference --      Peak Flow --      Pain Score 01/10/22 1459 0     Pain Loc --      Pain Edu? --      Excl. in [place]? --    No data found.  Updated Vital Signs BP 102/65 (BP Location: Right Arm)   Temp 98.8 F (37.1 C) (Oral)   Resp 18   SpO2 94%   Visual  Acuity Right Eye Distance:   Left Eye Distance:   Bilateral Distance:    Right Eye Near:   Left Eye Near:    Bilateral Near:     Physical Exam Vitals and nursing note reviewed.  Constitutional:      Appearance: Normal appearance.  Eyes:     Pupils: Pupils are equal, round, and reactive to light.  Pulmonary:     Effort: Pulmonary effort is normal.  Skin:    General: Skin is warm and dry.     Findings: Laceration present.     Comments: Laceration noted to the second digit of the left index finger.  Linear laceration noted to the DIP joint of the left index finger.  No bleeding present.  Neurological:     General: No focal deficit present.     Mental Status: He is alert and oriented to person, place, and time.  Psychiatric:        Mood and Affect: Mood normal.        Behavior: Behavior normal.        Thought Content: Thought content normal.        Judgment: Judgment normal.      UC Treatments / Results  Labs (all labs ordered are listed, but only abnormal results are displayed) Labs Reviewed - No data to display  EKG   Radiology DG Finger Index Left  Result Date: 01/10/2022 CLINICAL DATA:  Left index finger trauma EXAM: LEFT INDEX FINGER 2+V COMPARISON:  None Available. FINDINGS: There is no evidence of fracture or dislocation. Relatively minor osteoarthritic changes. No focal soft tissue swelling. No radiopaque foreign body. IMPRESSION: Negative. Electronically Signed   By: Davina Poke D.O.   On: 01/10/2022 15:22    Procedures Laceration Repair  Date/Time: 01/10/2022 3:49 PM  Performed by: Tish Men, NP Authorized by: Tish Men, NP   Consent:    Consent obtained:  Verbal   Consent given by:  Patient   Risks discussed:  Infection, pain, need for additional repair, poor cosmetic result and poor wound healing   Alternatives discussed:  No treatment Universal protocol:    Procedure explained and questions answered to patient or  proxy's satisfaction: yes     Test results available: yes (xray of left index finger negative for fracture)     Patient identity confirmed:  Verbally with patient and arm band Anesthesia:    Anesthesia method:  Nerve block   Block location:  Digital   Block needle gauge:  25 G   Block anesthetic:  Lidocaine 2% w/o epi Laceration details:    Location:  Finger   Finger location:  L index finger   Wound length (cm): V-shaped laceration approximately 1.5 cm to MIP joint of the left index finger.  Approximately 1 cm laceration noted to the DIP joint of the left index finger.   Laceration depth: Superficial. Pre-procedure details:    Preparation:  Patient was prepped and draped in usual sterile fashion Exploration:    Limited defect created (wound extended): yes     Imaging obtained: x-ray     Imaging outcome: foreign body not noted     Wound exploration: wound explored through full range of motion     Wound extent: no foreign bodies/material noted and no muscle damage noted     Contaminated: no   Treatment:    Wound cleansed with: Hibiclens and normal saline.   Amount of cleaning:  Standard   Irrigation solution:  Tap water   Irrigation method:  Tap Skin repair:    Repair method:  Sutures   Suture size:  3-0   Suture material:  Chromic gut   Suture technique:  Simple interrupted   Number of sutures:  4 Approximation:    Approximation:  Loose Repair type:    Repair type:  Simple Post-procedure details:    Dressing:  Antibiotic ointment and non-adherent dressing   Procedure completion:  Tolerated Comments:     Patient with 2 lacerations to the palmar aspect of the left index finger.  First laceration is V-shaped measuring approximately 1.5 cm to the MIP joint of the left index finger.  Second laceration measured approximately 1 cm to the DIP joint of the left index finger.  Patient tolerated well.  3 simple interrupted sutures placed to the MIP joint and 1 simple interrupted suture  to the DIP joint.  (including critical care time)  Medications Ordered in UC Medications - No data to display  Initial Impression / Assessment and Plan / UC Course  I have reviewed the triage vital signs and the nursing notes.  Pertinent labs & imaging results that were available during my care of the patient were reviewed by me and considered in my medical decision making (see chart for details).  Patient presents with injury to the left index finger that occurred after the left index finger became lodged between the garage door and the ground.  2 lacerations to the left index finger were sutured.  4 simple interrupted sutures were used in total to close the lacerations.  Patient tolerated the procedure well.  Wound care instructions were provided to the patient along with indications of when to follow-up.  Patient advised to follow-up in 10 days for suture removal. Final Clinical Impressions(s) / UC Diagnoses   Final diagnoses:  Laceration of left index finger without foreign body without damage to nail, initial encounter     Discharge Instructions      4 sutures were placed to 2 lacerations of the left index finger. Keep the dressing in place for 24 hours. Remove the dressing and clean the area with warm water in 24 hours.  When you are at home, may leave the area open to air.  When you are out, recommend keeping the area covered. May apply Neosporin to the lacerations as needed. May take  over-the-counter ibuprofen or Tylenol for pain or discomfort. Follow-up immediately if you develop swelling, increased redness that goes into the hand or up the arm, drainage, or if you develop fever, chills, or other concerns. Keep the sutures in place for 10 days.  You will follow-up on 7/22 for suture removal. Follow-up as needed.     ED Prescriptions   None    PDMP not reviewed this encounter.   Tish Men, NP 01/10/22 1601    Verl Kitson-Warren, Alda Lea, NP 01/11/22  1921    Tish Men, NP 01/11/22 1922

## 2022-01-10 NOTE — ED Triage Notes (Signed)
Pt reports was trying to pull a garage door down and reports left index finger got pinched between the doors and the floor approximately 5pm.   Two lacerations noted to anterior left index finger, bleeding controlled. One laceration noted to posterior left index finger, minimal bleeding noted at this time. Dressing applied.   Pt reports attempted to go to PCP but reports was unable to be seen and reports staff cleaned site with peroxide and sent pt home.

## 2022-01-10 NOTE — Discharge Instructions (Addendum)
4 sutures were placed to 2 lacerations of the left index finger. Keep the dressing in place for 24 hours. Remove the dressing and clean the area with warm water in 24 hours.  When you are at home, may leave the area open to air.  When you are out, recommend keeping the area covered. May apply Neosporin to the lacerations as needed. May take over-the-counter ibuprofen or Tylenol for pain or discomfort. Follow-up immediately if you develop swelling, increased redness that goes into the hand or up the arm, drainage, or if you develop fever, chills, or other concerns. Keep the sutures in place for 10 days.  You will follow-up on 7/22 for suture removal. Follow-up as needed.

## 2022-01-10 NOTE — ED Notes (Signed)
Dressing removed. Site soaking in Hibiclens at this time.

## 2022-01-11 ENCOUNTER — Telehealth: Payer: Self-pay | Admitting: Nurse Practitioner

## 2022-01-11 NOTE — Telephone Encounter (Signed)
Called patient to return to UC for Tdap. He did not receive while he was in the clinic on 01/10/22 after his laceration repair. Spoke with patient and asked him to come to the clinic for Tdap at his convenience on 01/12/2022.  Patient reports he will follow-up in the clinic on 01/12/2022 for his Tdap.

## 2022-01-12 ENCOUNTER — Ambulatory Visit
Admission: EM | Admit: 2022-01-12 | Discharge: 2022-01-12 | Disposition: A | Discharge status: Home / Self Care | Payer: Medicare Other | Attending: Nurse Practitioner | Admitting: Nurse Practitioner

## 2022-01-12 DIAGNOSIS — Z23 Encounter for immunization: Secondary | ICD-10-CM

## 2022-01-12 DIAGNOSIS — S61211D Laceration without foreign body of left index finger without damage to nail, subsequent encounter: Secondary | ICD-10-CM

## 2022-01-12 MED ORDER — TETANUS-DIPHTH-ACELL PERTUSSIS 5-2.5-18.5 LF-MCG/0.5 IM SUSY
0.5000 mL | PREFILLED_SYRINGE | Freq: Once | INTRAMUSCULAR | Status: AC
Start: 2022-01-12 — End: 2022-01-12
  Administered 2022-01-12: 0.5 mL via INTRAMUSCULAR

## 2022-01-12 NOTE — ED Triage Notes (Signed)
Patient here for tetanus shot only.  Needed from previous visit on 01/10/2022

## 2022-01-15 ENCOUNTER — Ambulatory Visit
Admission: RE | Admit: 2022-01-15 | Discharge: 2022-01-15 | Disposition: A | Discharge status: Home / Self Care | Payer: Medicare Other | Source: Ambulatory Visit | Attending: Student | Admitting: Student

## 2022-01-15 ENCOUNTER — Encounter: Payer: Self-pay | Admitting: *Deleted

## 2022-01-15 DIAGNOSIS — N2889 Other specified disorders of kidney and ureter: Secondary | ICD-10-CM | POA: Diagnosis not present

## 2022-01-15 DIAGNOSIS — N289 Disorder of kidney and ureter, unspecified: Secondary | ICD-10-CM

## 2022-01-15 DIAGNOSIS — Z9889 Other specified postprocedural states: Secondary | ICD-10-CM | POA: Diagnosis not present

## 2022-01-15 HISTORY — PX: IR RADIOLOGIST EVAL & MGMT: IMG5224

## 2022-01-15 NOTE — Progress Notes (Signed)
Chief Complaint: Patient was consulted remotely today (TeleHealth) for left renal cryoablation follow-up.  Referring Physician(s): Raynelle Bring, MD  History of Present Illness: Jesse Fletcher. is a 87 y.o. male with an incidentally discovered left renal lesion.  Patient underwent biopsy and image guided cryoablation of the left renal lesion on 12/20/2021.  The procedure was technically successful and the patient did well after the procedure.  The patient was discharged same day following the procedure.  The biopsy demonstrated a clear cell renal cell carcinoma, nuclear grade 2.  Patient had left flank pain the night of the procedure but has not had any pain since.  He has chronic back pain that is not new.  Patient was recently in the emergency department on 01/10/2022 for the laceration to the left index finger that required sutures.  Patient is scheduled to follow-up this week to have the sutures removed.  Patient denies fevers, chills, dysuria or hematuria.  Past Medical History:  Diagnosis Date   Arthritis    Ascending aorta dilation (HCC)    Blood in stool    Hemorrhoids    History of kidney stones    Hyperlipidemia    Hypertension    Pre-diabetes    Rectal fissure    Rectal pain    Skin cancer     Past Surgical History:  Procedure Laterality Date   HERNIA REPAIR  2000 (approx)   IR RADIOLOGIST EVAL & MGMT  11/09/2021   lower back surgery  05/1995, 05/1998   RADIOLOGY WITH ANESTHESIA Left 12/20/2021   Procedure: CT WITH ANESTHESIA CRYOABLATION;  Surgeon: Markus Daft, MD;  Location: [facility];  Service: Anesthesiology;  Laterality: Left;   SHOULDER SURGERY  1997   both    Allergies: Doxycycline  Medications: Prior to Admission medications   Medication Sig Start Date End Date Taking? Authorizing Provider  aspirin EC 81 MG tablet Take 81 mg by mouth daily. Swallow whole.    [provider]  lisinopril-hydrochlorothiazide (ZESTORETIC) 10-12.5 MG tablet Take 1  tablet by mouth 2 (two) times daily. 07/26/21   Susy Frizzle, MD  metoprolol tartrate (LOPRESSOR) 25 MG tablet Take 1 tablet by mouth twice daily 12/11/21   Susy Frizzle, MD  Multiple Vitamins-Minerals (CENTRUM SILVER ADULT 50+ PO) Take 1 tablet by mouth daily.    [provider]  simvastatin (ZOCOR) 20 MG tablet TAKE 1 TABLET BY MOUTH ONCE DAILY AT BEDTIME . APPOINTMENT REQUIRED FOR FUTURE REFILLS 09/22/21   Susy Frizzle, MD  triamcinolone cream (KENALOG) 0.1 % Apply 1 Application topically 2 (two) times daily. 12/14/21   Leath-Warren, Alda Lea, NP     No family history on file.  Social History   Socioeconomic History   Marital status: Widowed    Spouse name: Not on file   Number of children: Not on file   Years of education: Not on file   Highest education level: Not on file  Occupational History   Not on file  Tobacco Use   Smoking status: Never   Smokeless tobacco: Never  Vaping Use   Vaping Use: Never used  Substance and Sexual Activity   Alcohol use: No   Drug use: No   Sexual activity: Not on file  Other Topics Concern   Not on file  Social History Narrative   Not on file   Social Determinants of Health   Financial Resource Strain: Low Risk  (04/21/2021)   Overall Financial Resource Strain (CARDIA)  Difficulty of Paying Living Expenses: Not hard at all  Food Insecurity: No Food Insecurity (04/21/2021)   Hunger Vital Sign    Worried About Running Out of Food in the Last Year: Never true    Ran Out of Food in the Last Year: Never true  Transportation Needs: No Transportation Needs (04/21/2021)   PRAPARE - Hydrologist (Medical): No    Lack of Transportation (Non-Medical): No  Physical Activity: Sufficiently Active (04/21/2021)   Exercise Vital Sign    Days of Exercise per Week: 5 days    Minutes of Exercise per Session: 50 min  Stress: No Stress Concern Present (04/21/2021)   [place]    Feeling of Stress : Not at all  Social Connections: Moderately Integrated (04/21/2021)   Social Connection and Isolation Panel [NHANES]    Frequency of Communication with Friends and Family: More than three times a week    Frequency of Social Gatherings with Friends and Family: More than three times a week    Attends Religious Services: More than 4 times per year    Active Member of [employer] or Organizations: Yes    Attends Archivist Meetings: More than 4 times per year    Marital Status: Widowed    Review of Systems  Constitutional: Negative.   Gastrointestinal: Negative.   Genitourinary:  Negative for difficulty urinating, dysuria, flank pain and hematuria.      Physical Exam No direct physical exam was performed (except for noted visual exam findings with Video Visits).   Vital Signs: There were no vitals taken for this visit.  Imaging: DG Finger Index Left  Result Date: 01/10/2022 CLINICAL DATA:  Left index finger trauma EXAM: LEFT INDEX FINGER 2+V COMPARISON:  None Available. FINDINGS: There is no evidence of fracture or dislocation. Relatively minor osteoarthritic changes. No focal soft tissue swelling. No radiopaque foreign body. IMPRESSION: Negative. Electronically Signed   By: Davina Poke D.O.   On: 01/10/2022 15:22   CT GUIDE TISSUE ABLATION  Result Date: 12/20/2021 INDICATION: 87 year old with a suspicious left renal lesion. Plan for CT-guided biopsy and cryoablation. EXAM: CT-GUIDED CRYOABLATION OF LEFT RENAL LESION CT-GUIDED BIOPSY OF LEFT RENAL LESION COMPARISON:  MRI 10/07/2021 MEDICATIONS: Cefoxitin 2 g; The antibiotic was administered in an appropriate time interval prior to needle puncture of the skin. ANESTHESIA/SEDATION: General - as administered by the Anesthesia department FLUOROSCOPY: None COMPLICATIONS: None immediate. TECHNIQUE: Patient was placed under general anesthesia. Patient was placed prone.  Left kidney was identified with ultrasound but left renal mass was difficult to see with ultrasound. CT images through the abdomen were obtained. The slightly exophytic left renal lesion was identified. The left flank was prepped and draped in sterile fashion. Maximal barrier sterile technique was utilized including caps, mask, sterile gowns, sterile gloves, sterile drape, hand hygiene and skin antiseptic. Using CT guidance, 2 CryoCare PSC-17 probes were advanced into the left renal lesion. First probe was placed more along the caudal aspect of the lesion. Second probe was directed more along the cephalad aspect of the lesion. Needles were repositioned multiple times. Both ablation probes were advanced just beyond the lesion. A 22 gauge spinal needle was then directed between the left kidney and colon and hydrodissection was performed with dilute contrast. A 17 gauge coaxial needle was then directed into the lateral aspect of the renal lesion. Needle position was confirmed within the lesion and a single core biopsy was  obtained with an 18 gauge core device. Specimen was placed in formalin. Cryoablation was started with a 10 minute freeze. Imaging was obtained during the freeze to ensure adequate ice ball formation and to ensure adequate space between the ice ball and the colon. 8 minute thaw was performed followed by a second 10 minute freeze. Final 5 minute thaw was performed. The cryoablation probes were both easily removed. Follow up CT images were obtained. Bandage placed at the puncture sites. Patient was observed in the PACU following the procedure. Patient was asymptomatic following the procedure and able to urinate after the procedure. No evidence for hematuria. Patient was discharged home. FINDINGS: Slightly exophytic lesion in the lateral left kidney measured at least 2 cm. Lesion is adjacent to a left renal cyst. Cryoablation probes were successfully placed within the lesion. Biopsy needle was confirmed  along the lateral aspect of the lesion. Adequate hydrodissection was performed between the left kidney and the colon. There appeared to be an adequate ice ball formation and significant space between the ice ball and the colon. No significant hematoma formation following the procedure. IMPRESSION: CT-guided core biopsy and cryoablation of the suspicious left renal lesion. Electronically Signed   By: Markus Daft M.D.   On: 12/20/2021 17:11   CT BIOPSY  Result Date: 12/20/2021 INDICATION: 87 year old with a suspicious left renal lesion. Plan for CT-guided biopsy and cryoablation. EXAM: CT-GUIDED CRYOABLATION OF LEFT RENAL LESION CT-GUIDED BIOPSY OF LEFT RENAL LESION COMPARISON:  MRI 10/07/2021 MEDICATIONS: Cefoxitin 2 g; The antibiotic was administered in an appropriate time interval prior to needle puncture of the skin. ANESTHESIA/SEDATION: General - as administered by the Anesthesia department FLUOROSCOPY: None COMPLICATIONS: None immediate. TECHNIQUE: Patient was placed under general anesthesia. Patient was placed prone. Left kidney was identified with ultrasound but left renal mass was difficult to see with ultrasound. CT images through the abdomen were obtained. The slightly exophytic left renal lesion was identified. The left flank was prepped and draped in sterile fashion. Maximal barrier sterile technique was utilized including caps, mask, sterile gowns, sterile gloves, sterile drape, hand hygiene and skin antiseptic. Using CT guidance, 2 CryoCare PSC-17 probes were advanced into the left renal lesion. First probe was placed more along the caudal aspect of the lesion. Second probe was directed more along the cephalad aspect of the lesion. Needles were repositioned multiple times. Both ablation probes were advanced just beyond the lesion. A 22 gauge spinal needle was then directed between the left kidney and colon and hydrodissection was performed with dilute contrast. A 17 gauge coaxial needle was then  directed into the lateral aspect of the renal lesion. Needle position was confirmed within the lesion and a single core biopsy was obtained with an 18 gauge core device. Specimen was placed in formalin. Cryoablation was started with a 10 minute freeze. Imaging was obtained during the freeze to ensure adequate ice ball formation and to ensure adequate space between the ice ball and the colon. 8 minute thaw was performed followed by a second 10 minute freeze. Final 5 minute thaw was performed. The cryoablation probes were both easily removed. Follow up CT images were obtained. Bandage placed at the puncture sites. Patient was observed in the PACU following the procedure. Patient was asymptomatic following the procedure and able to urinate after the procedure. No evidence for hematuria. Patient was discharged home. FINDINGS: Slightly exophytic lesion in the lateral left kidney measured at least 2 cm. Lesion is adjacent to a left renal cyst. Cryoablation probes were  successfully placed within the lesion. Biopsy needle was confirmed along the lateral aspect of the lesion. Adequate hydrodissection was performed between the left kidney and the colon. There appeared to be an adequate ice ball formation and significant space between the ice ball and the colon. No significant hematoma formation following the procedure. IMPRESSION: CT-guided core biopsy and cryoablation of the suspicious left renal lesion. Electronically Signed   By: Markus Daft M.D.   On: 12/20/2021 17:11    Labs:  CBC: Recent Labs    08/03/21 0802 12/07/21 1128 12/20/21 0639  WBC 5.9 7.2 8.4  HGB 15.0 14.6 14.6  HCT 45.4 44.2 43.6  PLT 310 285 326    COAGS: Recent Labs    12/07/21 1128 12/20/21 0639  INR 1.0 1.0    BMP: Recent Labs    08/03/21 0802 09/13/21 1226 12/07/21 1128 12/20/21 0639  NA 134* 127* 134* 136  K 4.4 5.1 4.9 3.7  CL 97* 93* 101 101  CO2 '30 28 28 26  '$ GLUCOSE 104* 112* 98 105*  BUN '18 18 20 '$ 29*  CALCIUM  9.5 9.6 9.2 9.0  CREATININE 1.11 1.12 0.93 1.27*  GFRNONAA  --   --  >60 55*    LIVER FUNCTION TESTS: Recent Labs    08/03/21 0802 09/13/21 1226  BILITOT 0.8 0.8  AST 19 22  ALT 16 23  ALKPHOS  --  58  PROT 6.4 6.5  ALBUMIN  --  4.1    TUMOR MARKERS: No results for input(s): "AFPTM", "CEA", "CA199", "CHROMGRNA" in the last 8760 hours.  Assessment and Plan:  87 year old male with biopsy-proven left renal cell carcinoma that measured at least 2 cm in the left kidney.  The lesion was treated with cryoablation on 12/20/2021.  Patient tolerated the procedure well without postprocedural complications.  Will plan for a follow-up MRI of the abdomen in December 2023.  If the MRI is negative for recurrent tumor, we will plan for yearly imaging surveillance.  Patient will contact us if he has any questions or concerns in the interim.    Electronically Signed: Burman Riis 01/15/2022, 9:52 AM   I spent a total of    5 Minutes in remote  clinical consultation, greater than 50% of which was counseling/coordinating care for follow-up of left renal cryoablation.    Visit type: Audio only (telephone). Audio (no video) only due to technical limitations. Alternative for in-person consultation at [facility], [address] [place], [place], [place]. This visit type was conducted due to national recommendations for restrictions regarding the COVID-19 Pandemic (e.g. social distancing).  This format is felt to be most appropriate for this patient at this time.  All issues noted in this document were discussed and addressed.   Patient ID: Jesse Fletcher., male   DOB: [birth year], 87 y.o.   MRN: [id number]

## 2022-01-18 DIAGNOSIS — L570 Actinic keratosis: Secondary | ICD-10-CM | POA: Diagnosis not present

## 2022-01-19 ENCOUNTER — Ambulatory Visit
Admission: EM | Admit: 2022-01-19 | Discharge: 2022-01-19 | Disposition: A | Discharge status: Home / Self Care | Payer: Medicare Other

## 2022-01-19 NOTE — ED Triage Notes (Signed)
Pt presents to have sutures removed.

## 2022-02-06 ENCOUNTER — Ambulatory Visit (INDEPENDENT_AMBULATORY_CARE_PROVIDER_SITE_OTHER): Payer: Medicare Other | Admitting: Family Medicine

## 2022-02-06 VITALS — BP 110/52 | HR 73 | Temp 97.7°F | Ht 74.0 in | Wt 210.6 lb

## 2022-02-06 DIAGNOSIS — L739 Follicular disorder, unspecified: Secondary | ICD-10-CM

## 2022-02-06 MED ORDER — SULFAMETHOXAZOLE-TRIMETHOPRIM 800-160 MG PO TABS: 1.0000 | ORAL_TABLET | Freq: Two times a day (BID) | ORAL | 0 refills | Status: DC

## 2022-02-06 NOTE — Progress Notes (Signed)
Subjective:    Patient ID: Jesse Doyne., male    DOB: 1934-09-09, 87 y.o.   MRN: [id number] Patient reports a 3 to 4-day history of a rash on his medial left ankle and on his medial right ankle.  On his medial left ankle there are 4 pustules with an erythematous base that are each approximately 4 to 6 mm in diameter.  There is a large pustule right over his Achilles tendon.  On his right medial ankle, there is 1 pustule that ruptured.  There is no other rash anywhere else on his body.  He states that they itch. Past Medical History:  Diagnosis Date   Arthritis    Ascending aorta dilation (HCC)    Blood in stool    Hemorrhoids    History of kidney stones    Hyperlipidemia    Hypertension    Pre-diabetes    Rectal fissure    Rectal pain    Skin cancer    Past Surgical History:  Procedure Laterality Date   HERNIA REPAIR  2000 (approx)   IR RADIOLOGIST EVAL & MGMT  11/09/2021   IR RADIOLOGIST EVAL & MGMT  01/15/2022   lower back surgery  05/1995, 05/1998   RADIOLOGY WITH ANESTHESIA Left 12/20/2021   Procedure: CT WITH ANESTHESIA CRYOABLATION;  Surgeon: Markus Daft, MD;  Location: [facility];  Service: Anesthesiology;  Laterality: Left;   SHOULDER SURGERY  1997   both   Current Outpatient Medications on File Prior to Visit  Medication Sig Dispense Refill   aspirin EC 81 MG tablet Take 81 mg by mouth daily. Swallow whole.     lisinopril-hydrochlorothiazide (ZESTORETIC) 10-12.5 MG tablet Take 1 tablet by mouth 2 (two) times daily. 180 tablet 3   metoprolol tartrate (LOPRESSOR) 25 MG tablet Take 1 tablet by mouth twice daily 180 tablet 0   Multiple Vitamins-Minerals (CENTRUM SILVER ADULT 50+ PO) Take 1 tablet by mouth daily.     simvastatin (ZOCOR) 20 MG tablet TAKE 1 TABLET BY MOUTH ONCE DAILY AT BEDTIME . APPOINTMENT REQUIRED FOR FUTURE REFILLS 90 tablet 3   triamcinolone cream (KENALOG) 0.1 % Apply 1 Application topically 2 (two) times daily. 30 g 0   No current facility-administered  medications on file prior to visit.   Allergies  Allergen Reactions   Doxycycline     GI upset    Social History   Socioeconomic History   Marital status: Widowed    Spouse name: Not on file   Number of children: Not on file   Years of education: Not on file   Highest education level: Not on file  Occupational History   Not on file  Tobacco Use   Smoking status: Never   Smokeless tobacco: Never  Vaping Use   Vaping Use: Never used  Substance and Sexual Activity   Alcohol use: No   Drug use: No   Sexual activity: Not on file  Other Topics Concern   Not on file  Social History Narrative   Not on file   Social Determinants of Health   Financial Resource Strain: Low Risk  (04/21/2021)   Overall Financial Resource Strain (CARDIA)    Difficulty of Paying Living Expenses: Not hard at all  Food Insecurity: No Food Insecurity (04/21/2021)   Hunger Vital Sign    Worried About Running Out of Food in the Last Year: Never true    Ran Out of Food in the Last Year: Never true  Transportation Needs: No Transportation  Needs (04/21/2021)   PRAPARE - Hydrologist (Medical): No    Lack of Transportation (Non-Medical): No  Physical Activity: Sufficiently Active (04/21/2021)   Exercise Vital Sign    Days of Exercise per Week: 5 days    Minutes of Exercise per Session: 50 min  Stress: No Stress Concern Present (04/21/2021)   [place]    Feeling of Stress : Not at all  Social Connections: Moderately Integrated (04/21/2021)   Social Connection and Isolation Panel [NHANES]    Frequency of Communication with Friends and Family: More than three times a week    Frequency of Social Gatherings with Friends and Family: More than three times a week    Attends Religious Services: More than 4 times per year    Active Member of [employer] or Organizations: Yes    Attends Archivist Meetings: More  than 4 times per year    Marital Status: Widowed  Intimate Partner Violence: Not At Risk (04/21/2021)   Humiliation, Afraid, Rape, and Kick questionnaire    Fear of Current or Ex-Partner: No    Emotionally Abused: No    Physically Abused: No    Sexually Abused: No      Review of Systems  All other systems reviewed and are negative.      Objective:   Physical Exam Vitals reviewed.  Constitutional:      Appearance: Normal appearance. He is normal weight.  HENT:     Head: Normocephalic and atraumatic.  Cardiovascular:     Rate and Rhythm: Normal rate and regular rhythm.     Heart sounds: Normal heart sounds. No murmur heard. Pulmonary:     Effort: Pulmonary effort is normal. No respiratory distress.     Breath sounds: Normal breath sounds. No wheezing, rhonchi or rales.  Chest:  Breasts:    Right: Normal. No inverted nipple, mass, nipple discharge or skin change.     Left: Normal. No inverted nipple, mass, nipple discharge or skin change.  Abdominal:     General: Bowel sounds are normal. There is no distension.     Palpations: Abdomen is soft. There is no mass.     Tenderness: There is no abdominal tenderness. There is no guarding or rebound.     Hernia: No hernia is present.  Skin:    Findings: Rash present. Rash is pustular.  Neurological:     General: No focal deficit present.     Mental Status: He is alert. Mental status is at baseline.     Cranial Nerves: No cranial nerve deficit.     Sensory: No sensory deficit.     Motor: No weakness.     Coordination: Coordination normal.     Gait: Gait normal.           Assessment & Plan:  Folliculitis These appear to be pustules to me.  I believe he has a staph infection and that this is folliculitis.  Begin Bactrim double strength tablets twice daily for 7 days.  Other possibility would be some type of allergic reaction to insect bites or contact in that area given the fact that they are around his socks and he does a  lot of walking outside.  He could use triamcinolone cream 2-3 times a day as well to help with the itching which would also treat any type of topical allergic reaction in case I missed diagnosing him.  However, the rash appears  to be more likely pustules from folliculitis

## 2022-03-12 DIAGNOSIS — L408 Other psoriasis: Secondary | ICD-10-CM | POA: Diagnosis not present

## 2022-03-12 DIAGNOSIS — Z09 Encounter for follow-up examination after completed treatment for conditions other than malignant neoplasm: Secondary | ICD-10-CM | POA: Diagnosis not present

## 2022-03-12 DIAGNOSIS — L821 Other seborrheic keratosis: Secondary | ICD-10-CM | POA: Diagnosis not present

## 2022-03-12 DIAGNOSIS — L570 Actinic keratosis: Secondary | ICD-10-CM | POA: Diagnosis not present

## 2022-03-13 ENCOUNTER — Other Ambulatory Visit: Payer: Self-pay | Admitting: Family Medicine

## 2022-04-30 ENCOUNTER — Ambulatory Visit (INDEPENDENT_AMBULATORY_CARE_PROVIDER_SITE_OTHER): Payer: Medicare Other | Admitting: Family Medicine

## 2022-04-30 ENCOUNTER — Encounter: Payer: Self-pay | Admitting: Family Medicine

## 2022-04-30 VITALS — BP 130/70 | HR 78 | Ht 74.0 in | Wt 212.0 lb

## 2022-04-30 DIAGNOSIS — M109 Gout, unspecified: Secondary | ICD-10-CM

## 2022-04-30 MED ORDER — PREDNISONE 20 MG PO TABS: ORAL_TABLET | ORAL | 0 refills | Status: DC

## 2022-04-30 NOTE — Progress Notes (Signed)
Subjective:    Patient ID: Jesse Doyne., male    DOB: 1935/03/17, 87 y.o.   MRN: [id number] Patient reports 2 days of pain in his right first MTP joint.  He states that it aches and throbs at night.  The joint is not erythematous but it is tender to palpation.  Is not hot to the touch.  He states that he gets this "all the time in his left foot".  He states that his orthopedist typically gives him prednisone.  He has never been told that he has gout.  He denies any injuries Past Medical History:  Diagnosis Date   Arthritis    Ascending aorta dilation (HCC)    Blood in stool    Hemorrhoids    History of kidney stones    Hyperlipidemia    Hypertension    Pre-diabetes    Rectal fissure    Rectal pain    Skin cancer    Past Surgical History:  Procedure Laterality Date   HERNIA REPAIR  2000 (approx)   IR RADIOLOGIST EVAL & MGMT  11/09/2021   IR RADIOLOGIST EVAL & MGMT  01/15/2022   lower back surgery  05/1995, 05/1998   RADIOLOGY WITH ANESTHESIA Left 12/20/2021   Procedure: CT WITH ANESTHESIA CRYOABLATION;  Surgeon: Markus Daft, MD;  Location: [facility];  Service: Anesthesiology;  Laterality: Left;   SHOULDER SURGERY  1997   both   Current Outpatient Medications on File Prior to Visit  Medication Sig Dispense Refill   aspirin EC 81 MG tablet Take 81 mg by mouth daily. Swallow whole.     lisinopril-hydrochlorothiazide (ZESTORETIC) 10-12.5 MG tablet Take 1 tablet by mouth 2 (two) times daily. 180 tablet 3   metoprolol tartrate (LOPRESSOR) 25 MG tablet Take 1 tablet by mouth twice daily 180 tablet 0   Multiple Vitamins-Minerals (CENTRUM SILVER ADULT 50+ PO) Take 1 tablet by mouth daily.     simvastatin (ZOCOR) 20 MG tablet TAKE 1 TABLET BY MOUTH ONCE DAILY AT BEDTIME . APPOINTMENT REQUIRED FOR FUTURE REFILLS 90 tablet 3   sulfamethoxazole-trimethoprim (BACTRIM DS) 800-160 MG tablet Take 1 tablet by mouth 2 (two) times daily. 14 tablet 0   triamcinolone cream (KENALOG) 0.1 % Apply 1  Application topically 2 (two) times daily. 30 g 0   No current facility-administered medications on file prior to visit.   Allergies  Allergen Reactions   Doxycycline     GI upset    Social History   Socioeconomic History   Marital status: Widowed    Spouse name: Not on file   Number of children: Not on file   Years of education: Not on file   Highest education level: Not on file  Occupational History   Not on file  Tobacco Use   Smoking status: Never   Smokeless tobacco: Never  Vaping Use   Vaping Use: Never used  Substance and Sexual Activity   Alcohol use: No   Drug use: No   Sexual activity: Not on file  Other Topics Concern   Not on file  Social History Narrative   Not on file   Social Determinants of Health   Financial Resource Strain: Low Risk  (04/21/2021)   Overall Financial Resource Strain (CARDIA)    Difficulty of Paying Living Expenses: Not hard at all  Food Insecurity: No Food Insecurity (04/21/2021)   Hunger Vital Sign    Worried About Running Out of Food in the Last Year: Never true  Ran Out of Food in the Last Year: Never true  Transportation Needs: No Transportation Needs (04/21/2021)   PRAPARE - Hydrologist (Medical): No    Lack of Transportation (Non-Medical): No  Physical Activity: Sufficiently Active (04/21/2021)   Exercise Vital Sign    Days of Exercise per Week: 5 days    Minutes of Exercise per Session: 50 min  Stress: No Stress Concern Present (04/21/2021)   [place]    Feeling of Stress : Not at all  Social Connections: Moderately Integrated (04/21/2021)   Social Connection and Isolation Panel [NHANES]    Frequency of Communication with Friends and Family: More than three times a week    Frequency of Social Gatherings with Friends and Family: More than three times a week    Attends Religious Services: More than 4 times per year     Active Member of [employer] or Organizations: Yes    Attends Archivist Meetings: More than 4 times per year    Marital Status: Widowed  Intimate Partner Violence: Not At Risk (04/21/2021)   Humiliation, Afraid, Rape, and Kick questionnaire    Fear of Current or Ex-Partner: No    Emotionally Abused: No    Physically Abused: No    Sexually Abused: No      Review of Systems  All other systems reviewed and are negative.      Objective:   Physical Exam Vitals reviewed.  Constitutional:      Appearance: Normal appearance. He is normal weight.  HENT:     Head: Normocephalic and atraumatic.  Cardiovascular:     Rate and Rhythm: Normal rate and regular rhythm.     Heart sounds: Normal heart sounds. No murmur heard. Pulmonary:     Effort: Pulmonary effort is normal. No respiratory distress.     Breath sounds: Normal breath sounds. No wheezing, rhonchi or rales.  Chest:  Breasts:    Right: Normal. No inverted nipple, mass, nipple discharge or skin change.     Left: Normal. No inverted nipple, mass, nipple discharge or skin change.  Musculoskeletal:     Right foot: Decreased range of motion. Bunion present.       Feet:  Neurological:     General: No focal deficit present.     Mental Status: He is alert. Mental status is at baseline.     Cranial Nerves: No cranial nerve deficit.     Sensory: No sensory deficit.     Motor: No weakness.     Coordination: Coordination normal.     Gait: Gait normal.           Assessment & Plan:  Podagra - Plan: Uric acid, BASIC METABOLIC PANEL WITH GFR I believe this may be podagra.  Begin prednisone taper pack and check blood for uric acid as well as renal function

## 2022-05-01 LAB — BASIC METABOLIC PANEL WITH GFR
BUN: 21 mg/dL (ref 7–25)
CO2: 28 mmol/L (ref 20–32)
Calcium: 9.1 mg/dL (ref 8.6–10.3)
Chloride: 98 mmol/L (ref 98–110)
Creat: 1.04 mg/dL (ref 0.70–1.22)
Glucose, Bld: 105 mg/dL — ABNORMAL HIGH (ref 65–99)
Potassium: 4.5 mmol/L (ref 3.5–5.3)
Sodium: 133 mmol/L — ABNORMAL LOW (ref 135–146)
eGFR: 70 mL/min/{1.73_m2} (ref >=60)

## 2022-05-01 LAB — URIC ACID: Uric Acid, Serum: 5.5 mg/dL (ref 4.0–8.0)

## 2022-05-07 ENCOUNTER — Telehealth: Payer: Self-pay | Admitting: Family Medicine

## 2022-05-07 NOTE — Telephone Encounter (Signed)
Patient called to inform provider about result of taking prescribed medication for recent gout flareup. Patient stated the medication initially gave relief. However the sx returned and worsened over the weekend. Patient now having issue with both feet and ankles. Requesting either a refill or a different medication.  Pharmacy confirmed as:  [facility] ([place]), [place] - [address] [facility] ([place]) [place] [address] Phone: [phone]  Fax: [phone]    Please advise at [phone].

## 2022-05-08 ENCOUNTER — Other Ambulatory Visit: Payer: Self-pay

## 2022-05-08 DIAGNOSIS — L739 Follicular disorder, unspecified: Secondary | ICD-10-CM

## 2022-05-08 DIAGNOSIS — M109 Gout, unspecified: Secondary | ICD-10-CM

## 2022-05-08 MED ORDER — MELOXICAM 15 MG PO TABS: 15.0000 mg | ORAL_TABLET | Freq: Every day | ORAL | 0 refills | Status: DC

## 2022-05-11 DIAGNOSIS — R351 Nocturia: Secondary | ICD-10-CM | POA: Diagnosis not present

## 2022-05-11 DIAGNOSIS — D49512 Neoplasm of unspecified behavior of left kidney: Secondary | ICD-10-CM | POA: Diagnosis not present

## 2022-05-11 DIAGNOSIS — N411 Chronic prostatitis: Secondary | ICD-10-CM | POA: Diagnosis not present

## 2022-05-11 DIAGNOSIS — N401 Enlarged prostate with lower urinary tract symptoms: Secondary | ICD-10-CM | POA: Diagnosis not present

## 2022-05-15 ENCOUNTER — Other Ambulatory Visit: Payer: Self-pay | Admitting: Diagnostic Radiology

## 2022-05-15 DIAGNOSIS — N2889 Other specified disorders of kidney and ureter: Secondary | ICD-10-CM

## 2022-06-08 ENCOUNTER — Other Ambulatory Visit: Payer: Self-pay | Admitting: Family Medicine

## 2022-06-08 NOTE — Telephone Encounter (Signed)
Requested Prescriptions  Pending Prescriptions Disp Refills   metoprolol tartrate (LOPRESSOR) 25 MG tablet [Pharmacy Med Name: Metoprolol Tartrate 25 MG Oral Tablet] 180 tablet 0    Sig: Take 1 tablet by mouth twice daily     Cardiovascular:  Beta Blockers Failed - 06/08/2022 11:29 AM      Failed - Valid encounter within last 6 months    Recent Outpatient Visits           9 months ago Constipation, unspecified constipation type   [place] Susy Frizzle, MD   9 months ago Constipation, unspecified constipation type   [place] Pickard, Cammie Mcgee, MD   10 months ago Encounter for [employer] annual wellness exam   [place] Susy Frizzle, MD   11 months ago Viral URI   [place] Eulogio Bear, NP   1 year ago Benign essential HTN   [place], Cammie Mcgee, MD              Passed - Last BP in normal range    BP Readings from Last 1 Encounters:  04/30/22 130/70         Passed - Last Heart Rate in normal range    Pulse Readings from Last 1 Encounters:  04/30/22 78

## 2022-06-12 ENCOUNTER — Ambulatory Visit (HOSPITAL_COMMUNITY)
Admission: RE | Admit: 2022-06-12 | Discharge: 2022-06-12 | Disposition: A | Discharge status: Home / Self Care | Payer: Medicare Other | Source: Ambulatory Visit | Attending: Diagnostic Radiology | Admitting: Diagnostic Radiology

## 2022-06-12 DIAGNOSIS — Z9889 Other specified postprocedural states: Secondary | ICD-10-CM | POA: Diagnosis not present

## 2022-06-12 DIAGNOSIS — N2889 Other specified disorders of kidney and ureter: Secondary | ICD-10-CM | POA: Diagnosis not present

## 2022-06-12 DIAGNOSIS — C642 Malignant neoplasm of left kidney, except renal pelvis: Secondary | ICD-10-CM | POA: Diagnosis not present

## 2022-06-12 DIAGNOSIS — I700 Atherosclerosis of aorta: Secondary | ICD-10-CM | POA: Diagnosis not present

## 2022-06-12 DIAGNOSIS — N281 Cyst of kidney, acquired: Secondary | ICD-10-CM | POA: Diagnosis not present

## 2022-06-12 MED ORDER — GADOBUTROL 1 MMOL/ML IV SOLN
9.5000 mL | Freq: Once | INTRAVENOUS | Status: AC | PRN
Start: 2022-06-12 — End: 2022-06-12
  Administered 2022-06-12: 9.5 mL via INTRAVENOUS

## 2022-06-14 ENCOUNTER — Ambulatory Visit
Admission: RE | Admit: 2022-06-14 | Discharge: 2022-06-14 | Disposition: A | Discharge status: Home / Self Care | Payer: Medicare Other | Source: Ambulatory Visit | Attending: Diagnostic Radiology | Admitting: Diagnostic Radiology

## 2022-06-14 DIAGNOSIS — N2889 Other specified disorders of kidney and ureter: Secondary | ICD-10-CM

## 2022-06-14 DIAGNOSIS — Z9889 Other specified postprocedural states: Secondary | ICD-10-CM | POA: Diagnosis not present

## 2022-06-14 NOTE — Progress Notes (Signed)
Chief Complaint: Patient was consulted remotely today (TeleHealth) for left renal cryoablation   Referring Physician(s): Raynelle Bring  History of Present Illness: Jesse Butler. is a 87 y.o. with a biopsy-proven clear cell renal carcinoma in the left kidney.  The left renal lesion was treated with cryoablation on 12/20/2021.  Patient had follow-up MRI on 06/12/2022.  Patient has no complaints at this time.  He has had issues with foot gout and it has been treated. He denies hematuria or dysuria.  He has chronic back pain.  Overall, the patient is doing well.  Past Medical History:  Diagnosis Date   Arthritis    Ascending aorta dilation (HCC)    Blood in stool    Hemorrhoids    History of kidney stones    Hyperlipidemia    Hypertension    Pre-diabetes    Rectal fissure    Rectal pain    Skin cancer     Past Surgical History:  Procedure Laterality Date   HERNIA REPAIR  2000 (approx)   IR RADIOLOGIST EVAL & MGMT  11/09/2021   IR RADIOLOGIST EVAL & MGMT  01/15/2022   lower back surgery  05/1995, 05/1998   RADIOLOGY WITH ANESTHESIA Left 12/20/2021   Procedure: CT WITH ANESTHESIA CRYOABLATION;  Surgeon: Markus Daft, MD;  Location: [facility];  Service: Anesthesiology;  Laterality: Left;   SHOULDER SURGERY  1997   both    Allergies: Doxycycline  Medications: Prior to Admission medications   Medication Sig Start Date End Date Taking? Authorizing Provider  aspirin EC 81 MG tablet Take 81 mg by mouth daily. Swallow whole.    [provider]  lisinopril-hydrochlorothiazide (ZESTORETIC) 10-12.5 MG tablet Take 1 tablet by mouth 2 (two) times daily. 07/26/21   Susy Frizzle, MD  meloxicam (MOBIC) 15 MG tablet Take 1 tablet (15 mg total) by mouth daily. 05/08/22   Susy Frizzle, MD  metoprolol tartrate (LOPRESSOR) 25 MG tablet Take 1 tablet by mouth twice daily 06/08/22   Susy Frizzle, MD  Multiple Vitamins-Minerals (CENTRUM SILVER ADULT 50+ PO) Take 1 tablet  by mouth daily.    [provider]  predniSONE (DELTASONE) 20 MG tablet 3 tabs poqday 1-2, 2 tabs poqday 3-4, 1 tab poqday 5-6 04/30/22   Susy Frizzle, MD  simvastatin (ZOCOR) 20 MG tablet TAKE 1 TABLET BY MOUTH ONCE DAILY AT BEDTIME . APPOINTMENT REQUIRED FOR FUTURE REFILLS 09/22/21   Susy Frizzle, MD  sulfamethoxazole-trimethoprim (BACTRIM DS) 800-160 MG tablet Take 1 tablet by mouth 2 (two) times daily. 02/06/22   Susy Frizzle, MD  triamcinolone cream (KENALOG) 0.1 % Apply 1 Application topically 2 (two) times daily. 12/14/21   Leath-Warren, Alda Lea, NP     No family history on file.  Social History   Socioeconomic History   Marital status: Widowed    Spouse name: Not on file   Number of children: Not on file   Years of education: Not on file   Highest education level: Not on file  Occupational History   Not on file  Tobacco Use   Smoking status: Never   Smokeless tobacco: Never  Vaping Use   Vaping Use: Never used  Substance and Sexual Activity   Alcohol use: No   Drug use: No   Sexual activity: Not on file  Other Topics Concern   Not on file  Social History Narrative   Not on file   Social Determinants of Health  Financial Resource Strain: Low Risk  (04/21/2021)   Overall Financial Resource Strain (CARDIA)    Difficulty of Paying Living Expenses: Not hard at all  Food Insecurity: No Food Insecurity (04/21/2021)   Hunger Vital Sign    Worried About Running Out of Food in the Last Year: Never true    Ran Out of Food in the Last Year: Never true  Transportation Needs: No Transportation Needs (04/21/2021)   PRAPARE - Hydrologist (Medical): No    Lack of Transportation (Non-Medical): No  Physical Activity: Sufficiently Active (04/21/2021)   Exercise Vital Sign    Days of Exercise per Week: 5 days    Minutes of Exercise per Session: 50 min  Stress: No Stress Concern Present (04/21/2021)   [place]    Feeling of Stress : Not at all  Social Connections: Moderately Integrated (04/21/2021)   Social Connection and Isolation Panel [NHANES]    Frequency of Communication with Friends and Family: More than three times a week    Frequency of Social Gatherings with Friends and Family: More than three times a week    Attends Religious Services: More than 4 times per year    Active Member of [employer] or Organizations: Yes    Attends Archivist Meetings: More than 4 times per year    Marital Status: Widowed     Review of Systems  Constitutional: Negative.   Genitourinary:  Positive for frequency. Negative for dysuria and hematuria.  Musculoskeletal:  Positive for back pain.     Physical Exam No direct physical exam was performed  Vital Signs: There were no vitals taken for this visit.  Imaging: MR ABDOMEN WWO CONTRAST  Result Date: 06/12/2022 CLINICAL DATA:  Follow-up left renal cryoablation EXAM: MRI ABDOMEN WITHOUT AND WITH CONTRAST TECHNIQUE: Multiplanar multisequence MR imaging of the abdomen was performed both before and after the administration of intravenous contrast. CONTRAST:  9.46m GADAVIST GADOBUTROL 1 MMOL/ML IV SOLN COMPARISON:  MR abdomen, 10/07/2021, CT-guided ablation, 12/20/2021 FINDINGS: Lower chest: No acute abnormality. Hepatobiliary: No solid liver abnormality is seen. No gallstones, gallbladder wall thickening, or biliary dilatation. Pancreas: Unremarkable. No pancreatic ductal dilatation or surrounding inflammatory changes. Spleen: Normal in size without significant abnormality. Adrenals/Urinary Tract: Adrenal glands are unremarkable. Status post percutaneous ablation of the lateral midportion of the left kidney, with no evidence of residual contrast enhancement (series 16, image 60, series 19, image 60). Additional small benign renal cortical cysts, for which no further follow-up or characterization is  required. Stomach/Bowel: Stomach is within normal limits. No evidence of bowel wall thickening, distention, or inflammatory changes. Large burden of stool throughout the included colon. Vascular/Lymphatic: Aortic atherosclerosis. No enlarged abdominal lymph nodes. Other: No abdominal wall hernia or abnormality. No ascites. Musculoskeletal: No acute or significant osseous findings. IMPRESSION: 1. Status post percutaneous ablation of the lateral midportion of the left kidney, with no evidence of residual contrast enhancement. 2. No evidence of lymphadenopathy or metastatic disease in the abdomen. 3. Large burden of stool throughout the included colon. Aortic Atherosclerosis (ICD10-I70.0). Electronically Signed   By: ADelanna AhmadiM.D.   On: 06/12/2022 16:49    Labs:  CBC: Recent Labs    08/03/21 0802 12/07/21 1128 12/20/21 0639  WBC 5.9 7.2 8.4  HGB 15.0 14.6 14.6  HCT 45.4 44.2 43.6  PLT 310 285 326    COAGS: Recent Labs    12/07/21 1128 12/20/21 0639  INR  1.0 1.0    BMP: Recent Labs    09/13/21 1226 12/07/21 1128 12/20/21 0639 04/30/22 1541  NA 127* 134* 136 133*  K 5.1 4.9 3.7 4.5  CL 93* 101 101 98  CO2 '28 28 26 28  '$ GLUCOSE 112* 98 105* 105*  BUN 18 20 29* 21  CALCIUM 9.6 9.2 9.0 9.1  CREATININE 1.12 0.93 1.27* 1.04  GFRNONAA  --  >60 55*  --     LIVER FUNCTION TESTS: Recent Labs    08/03/21 0802 09/13/21 1226  BILITOT 0.8 0.8  AST 19 22  ALT 16 23  ALKPHOS  --  58  PROT 6.4 6.5  ALBUMIN  --  4.1    TUMOR MARKERS: No results for input(s): "AFPTM", "CEA", "CA199", "CHROMGRNA" in the last 8760 hours.  Assessment and Plan:  87 year old with history of a 2 cm left renal lesion.  This lesion was biopsied and treated with cryoablation on 12/20/2021.  Biopsy demonstrated a clear cell renal cell carcinoma.  Patient has done very well following the cryoablation and he has no urinary symptoms at this time.  Renal function has been stable since the ablation. Patient  had a follow-up abdominal MRI on 06/12/2022.  I personally reviewed the imaging and agree with the interpretation.  There are expected post ablation changes without clear evidence for recurrent or residual disease at the ablation site.  No new suspicious renal lesions.  I discussed the imaging findings with the patient over the telephone.  Recommend routine surveillance at this point.  Plan for follow-up MRI in 1 year.    Electronically Signed: Burman Riis 06/14/2022, 9:58 AM   I spent a total of    5 Minutes in remote  clinical consultation, greater than 50% of which was counseling/coordinating care for treated left renal cell carcinoma..    Visit type: Audio only (telephone). Audio (no video) only due to technical imitations. Alternative for in-person consultation at [facility], [address] [place], [place], [place]. This visit type was conducted due to national recommendations for restrictions regarding the COVID-19 Pandemic (e.g. social distancing).  This format is felt to be most appropriate for this patient at this time.  All issues noted in this document were discussed and addressed.  Patient ID: Jesse Doyne., male   DOB: [birth year], 87 y.o.   MRN: [id number]

## 2022-06-16 ENCOUNTER — Other Ambulatory Visit: Payer: Self-pay | Admitting: Family Medicine

## 2022-07-04 DIAGNOSIS — M545 Low back pain, unspecified: Secondary | ICD-10-CM | POA: Diagnosis not present

## 2022-07-16 ENCOUNTER — Ambulatory Visit (INDEPENDENT_AMBULATORY_CARE_PROVIDER_SITE_OTHER): Payer: Medicare Other | Admitting: Family Medicine

## 2022-07-16 VITALS — BP 126/74 | HR 69 | Ht 74.0 in | Wt 214.0 lb

## 2022-07-16 DIAGNOSIS — R1013 Epigastric pain: Secondary | ICD-10-CM

## 2022-07-16 MED ORDER — PANTOPRAZOLE SODIUM 40 MG PO TBEC: 40.0000 mg | DELAYED_RELEASE_TABLET | Freq: Every day | ORAL | 3 refills | Status: DC

## 2022-07-16 NOTE — Progress Notes (Signed)
Subjective:    Patient ID: Jesse Doyne., male    DOB: 1935/02/15, 88 y.o.   MRN: [id number] Patient is a very pleasant 88 year old Caucasian gentleman who has been taking prednisone for sciatica.  He may also be taking NSAIDs.  Meloxicam is on his medication list and he also mentions that he takes Advil or Aleve occasionally although he is not certain.  He reports a gnawing pain in his stomach/epigastric area.  He reports increased bowel sounds and discomfort and burping.  He denies any melena or hematochezia.  He denies any nausea or vomiting.  He denies any fever or chills.  His abdomen is soft nontender and nondistended with normal bowel sounds Past Medical History:  Diagnosis Date   Arthritis    Ascending aorta dilation (HCC)    Blood in stool    Hemorrhoids    History of kidney stones    Hyperlipidemia    Hypertension    Pre-diabetes    Rectal fissure    Rectal pain    Skin cancer    Past Surgical History:  Procedure Laterality Date   HERNIA REPAIR  2000 (approx)   IR RADIOLOGIST EVAL & MGMT  11/09/2021   IR RADIOLOGIST EVAL & MGMT  01/15/2022   lower back surgery  05/1995, 05/1998   RADIOLOGY WITH ANESTHESIA Left 12/20/2021   Procedure: CT WITH ANESTHESIA CRYOABLATION;  Surgeon: Markus Daft, MD;  Location: [facility];  Service: Anesthesiology;  Laterality: Left;   SHOULDER SURGERY  1997   both   Current Outpatient Medications on File Prior to Visit  Medication Sig Dispense Refill   aspirin EC 81 MG tablet Take 81 mg by mouth daily. Swallow whole.     lisinopril-hydrochlorothiazide (ZESTORETIC) 10-12.5 MG tablet Take 1 tablet by mouth 2 (two) times daily. 180 tablet 3   meloxicam (MOBIC) 15 MG tablet Take 1 tablet (15 mg total) by mouth daily. 30 tablet 0   metoprolol tartrate (LOPRESSOR) 25 MG tablet Take 1 tablet by mouth twice daily 180 tablet 0   Multiple Vitamins-Minerals (CENTRUM SILVER ADULT 50+ PO) Take 1 tablet by mouth daily.     predniSONE (STERAPRED UNI-PAK 48  TAB) 5 MG (48) TBPK tablet Take 5 mg by mouth as directed.     simvastatin (ZOCOR) 20 MG tablet TAKE 1 TABLET BY MOUTH ONCE DAILY AT BEDTIME . APPOINTMENT REQUIRED FOR FUTURE REFILLS 90 tablet 3   triamcinolone cream (KENALOG) 0.1 % Apply 1 Application topically 2 (two) times daily. 30 g 0   No current facility-administered medications on file prior to visit.   Allergies  Allergen Reactions   Doxycycline     GI upset    Social History   Socioeconomic History   Marital status: Widowed    Spouse name: Not on file   Number of children: Not on file   Years of education: Not on file   Highest education level: Not on file  Occupational History   Not on file  Tobacco Use   Smoking status: Never   Smokeless tobacco: Never  Vaping Use   Vaping Use: Never used  Substance and Sexual Activity   Alcohol use: No   Drug use: No   Sexual activity: Not on file  Other Topics Concern   Not on file  Social History Narrative   Not on file   Social Determinants of Health   Financial Resource Strain: Low Risk  (04/21/2021)   Overall Financial Resource Strain (CARDIA)    Difficulty  of Paying Living Expenses: Not hard at all  Food Insecurity: No Food Insecurity (04/21/2021)   Hunger Vital Sign    Worried About Running Out of Food in the Last Year: Never true    Ran Out of Food in the Last Year: Never true  Transportation Needs: No Transportation Needs (04/21/2021)   PRAPARE - Hydrologist (Medical): No    Lack of Transportation (Non-Medical): No  Physical Activity: Sufficiently Active (04/21/2021)   Exercise Vital Sign    Days of Exercise per Week: 5 days    Minutes of Exercise per Session: 50 min  Stress: No Stress Concern Present (04/21/2021)   [place]    Feeling of Stress : Not at all  Social Connections: Moderately Integrated (04/21/2021)   Social Connection and Isolation Panel [NHANES]     Frequency of Communication with Friends and Family: More than three times a week    Frequency of Social Gatherings with Friends and Family: More than three times a week    Attends Religious Services: More than 4 times per year    Active Member of [employer] or Organizations: Yes    Attends Archivist Meetings: More than 4 times per year    Marital Status: Widowed  Intimate Partner Violence: Not At Risk (04/21/2021)   Humiliation, Afraid, Rape, and Kick questionnaire    Fear of Current or Ex-Partner: No    Emotionally Abused: No    Physically Abused: No    Sexually Abused: No      Review of Systems  All other systems reviewed and are negative.      Objective:   Physical Exam Vitals reviewed.  Constitutional:      Appearance: Normal appearance. He is normal weight.  HENT:     Head: Normocephalic and atraumatic.  Cardiovascular:     Rate and Rhythm: Normal rate and regular rhythm.     Heart sounds: Normal heart sounds. No murmur heard. Pulmonary:     Effort: Pulmonary effort is normal. No respiratory distress.     Breath sounds: Normal breath sounds. No wheezing, rhonchi or rales.  Chest:  Breasts:    Right: Normal. No inverted nipple, mass, nipple discharge or skin change.     Left: Normal. No inverted nipple, mass, nipple discharge or skin change.  Abdominal:     General: Abdomen is flat. Bowel sounds are normal.     Palpations: Abdomen is soft. There is no hepatomegaly or mass.     Tenderness: There is no abdominal tenderness. There is no guarding or rebound.  Neurological:     General: No focal deficit present.     Mental Status: He is alert. Mental status is at baseline.     Cranial Nerves: No cranial nerve deficit.     Sensory: No sensory deficit.     Motor: No weakness.     Coordination: Coordination normal.     Gait: Gait normal.           Assessment & Plan:  Epigastric pain I believe the patient is dealing with gastritis likely from NSAID use.   Stop prednisone.  Stop NSAIDs.  Begin Protonix 40 mg daily and reassess in 1 week or immediately if worsening

## 2022-07-19 ENCOUNTER — Other Ambulatory Visit: Payer: Self-pay | Admitting: Family Medicine

## 2022-07-23 ENCOUNTER — Telehealth: Payer: Self-pay

## 2022-07-23 NOTE — Telephone Encounter (Signed)
Pt called in stating that he saw pcp for stomach issue last week and pcp told pt to let him know if he was still having issues with stomach pain. Pt states that he is still having stomach pain. Please advise.  Cb#: [phone]

## 2022-07-26 ENCOUNTER — Other Ambulatory Visit: Payer: Medicare Other

## 2022-08-02 ENCOUNTER — Other Ambulatory Visit: Payer: Medicare Other

## 2022-08-02 DIAGNOSIS — I10 Essential (primary) hypertension: Secondary | ICD-10-CM

## 2022-08-02 DIAGNOSIS — E7800 Pure hypercholesterolemia, unspecified: Secondary | ICD-10-CM

## 2022-08-02 DIAGNOSIS — R1013 Epigastric pain: Secondary | ICD-10-CM

## 2022-08-02 DIAGNOSIS — R634 Abnormal weight loss: Secondary | ICD-10-CM

## 2022-08-03 LAB — COMPLETE METABOLIC PANEL WITH GFR
AG Ratio: 1.6 (calc) (ref 1.0–2.5)
ALT: 22 U/L (ref 9–46)
AST: 21 U/L (ref 10–35)
Albumin: 4.1 g/dL (ref 3.6–5.1)
Alkaline phosphatase (APISO): 69 U/L (ref 35–144)
BUN: 19 mg/dL (ref 7–25)
CO2: 29 mmol/L (ref 20–32)
Calcium: 9.6 mg/dL (ref 8.6–10.3)
Chloride: 96 mmol/L — ABNORMAL LOW (ref 98–110)
Creat: 1.08 mg/dL (ref 0.70–1.22)
Globulin: 2.6 g/dL (calc) (ref 1.9–3.7)
Glucose, Bld: 114 mg/dL — ABNORMAL HIGH (ref 65–99)
Potassium: 4.3 mmol/L (ref 3.5–5.3)
Sodium: 132 mmol/L — ABNORMAL LOW (ref 135–146)
Total Bilirubin: 0.9 mg/dL (ref 0.2–1.2)
Total Protein: 6.7 g/dL (ref 6.1–8.1)
eGFR: 67 mL/min/{1.73_m2} (ref >=60)

## 2022-08-03 LAB — CBC WITH DIFFERENTIAL/PLATELET
Absolute Monocytes: 623 cells/uL (ref 200–950)
Basophils Absolute: 63 cells/uL (ref 0–200)
Basophils Relative: 0.9 %
Eosinophils Absolute: 168 cells/uL (ref 15–500)
Eosinophils Relative: 2.4 %
HCT: 45.6 % (ref 38.5–50.0)
Hemoglobin: 15.6 g/dL (ref 13.2–17.1)
Lymphs Abs: 1813 cells/uL (ref 850–3900)
MCH: 29.7 pg (ref 27.0–33.0)
MCHC: 34.2 g/dL (ref 32.0–36.0)
MCV: 86.7 fL (ref 80.0–100.0)
MPV: 9.4 fL (ref 7.5–12.5)
Monocytes Relative: 8.9 %
Neutro Abs: 4333 cells/uL (ref 1500–7800)
Neutrophils Relative %: 61.9 %
Platelets: 338 10*3/uL (ref 140–400)
RBC: 5.26 10*6/uL (ref 4.20–5.80)
RDW: 12.7 % (ref 11.0–15.0)
Total Lymphocyte: 25.9 %
WBC: 7 10*3/uL (ref 3.8–10.8)

## 2022-08-03 LAB — LIPID PANEL
Cholesterol: 139 mg/dL (ref <200)
HDL: 63 mg/dL (ref >=40)
LDL Cholesterol (Calc): 62 mg/dL (calc)
Non-HDL Cholesterol (Calc): 76 mg/dL (calc) (ref <130)
Total CHOL/HDL Ratio: 2.2 (calc) (ref <5.0)
Triglycerides: 68 mg/dL (ref <150)

## 2022-08-03 LAB — LIPASE: Lipase: 18 U/L (ref 7–60)

## 2022-08-08 LAB — H. PYLORI BREATH TEST: H. pylori Breath Test: NOT DETECTED

## 2022-08-09 ENCOUNTER — Encounter: Payer: Self-pay | Admitting: Family Medicine

## 2022-08-09 ENCOUNTER — Ambulatory Visit (INDEPENDENT_AMBULATORY_CARE_PROVIDER_SITE_OTHER): Payer: Medicare Other | Admitting: Family Medicine

## 2022-08-09 VITALS — BP 120/70 | HR 63 | Temp 97.9°F | Ht 74.0 in | Wt 212.0 lb

## 2022-08-09 DIAGNOSIS — R1013 Epigastric pain: Secondary | ICD-10-CM

## 2022-08-09 NOTE — Progress Notes (Signed)
Subjective:    Patient ID: Jesse Doyne., male    DOB: 16-Jan-1935, 88 y.o.   MRN: [id number] 07/16/22 Patient is a very pleasant 88 year old Caucasian gentleman who has been taking prednisone for sciatica.  He may also be taking NSAIDs.  Meloxicam is on his medication list and he also mentions that he takes Advil or Aleve occasionally although he is not certain.  He reports a gnawing pain in his stomach/epigastric area.  He reports increased bowel sounds and discomfort and burping.  He denies any melena or hematochezia.  He denies any nausea or vomiting.  He denies any fever or chills.  His abdomen is soft nontender and nondistended with normal bowel sounds.  At that time, my plan was:  I believe the patient is dealing with gastritis likely from NSAID use.  Stop prednisone.  Stop NSAIDs.  Begin Protonix 40 mg daily and reassess in 1 week or immediately if worsening  08/09/22 Pain did not improve so we checked baseline labs: Lab on 08/02/2022  Component Date Value Ref Range Status   WBC 08/02/2022 7.0  3.8 - 10.8 Thousand/uL Final   RBC 08/02/2022 5.26  4.20 - 5.80 Million/uL Final   Hemoglobin 08/02/2022 15.6  13.2 - 17.1 g/dL Final   HCT 08/02/2022 45.6  38.5 - 50.0 % Final   MCV 08/02/2022 86.7  80.0 - 100.0 fL Final   MCH 08/02/2022 29.7  27.0 - 33.0 pg Final   MCHC 08/02/2022 34.2  32.0 - 36.0 g/dL Final   RDW 08/02/2022 12.7  11.0 - 15.0 % Final   Platelets 08/02/2022 338  140 - 400 Thousand/uL Final   MPV 08/02/2022 9.4  7.5 - 12.5 fL Final   Neutro Abs 08/02/2022 4,333  1,500 - 7,800 cells/uL Final   Lymphs Abs 08/02/2022 1,813  850 - 3,900 cells/uL Final   Absolute Monocytes 08/02/2022 623  200 - 950 cells/uL Final   Eosinophils Absolute 08/02/2022 168  15 - 500 cells/uL Final   Basophils Absolute 08/02/2022 63  0 - 200 cells/uL Final   Neutrophils Relative % 08/02/2022 61.9  % Final   Total Lymphocyte 08/02/2022 25.9  % Final   Monocytes Relative 08/02/2022 8.9  % Final    Eosinophils Relative 08/02/2022 2.4  % Final   Basophils Relative 08/02/2022 0.9  % Final   Glucose, Bld 08/02/2022 114 (H)  65 - 99 mg/dL Final   Comment: .            Fasting reference interval . For someone without known diabetes, a glucose value between 100 and 125 mg/dL is consistent with prediabetes and should be confirmed with a follow-up test. .    BUN 08/02/2022 19  7 - 25 mg/dL Final   Creat 08/02/2022 1.08  0.70 - 1.22 mg/dL Final   eGFR 08/02/2022 67  > OR = 60 mL/min/1.72m Final   BUN/Creatinine Ratio 08/02/2022 SEE NOTE:  6 - 22 (calc) Final   Comment:    Not Reported: BUN and Creatinine are within    reference range. .    Sodium 08/02/2022 132 (L)  135 - 146 mmol/L Final   Potassium 08/02/2022 4.3  3.5 - 5.3 mmol/L Final   Chloride 08/02/2022 96 (L)  98 - 110 mmol/L Final   CO2 08/02/2022 29  20 - 32 mmol/L Final   Calcium 08/02/2022 9.6  8.6 - 10.3 mg/dL Final   Total Protein 08/02/2022 6.7  6.1 - 8.1 g/dL Final   Albumin 08/02/2022  4.1  3.6 - 5.1 g/dL Final   Globulin 08/02/2022 2.6  1.9 - 3.7 g/dL (calc) Final   AG Ratio 08/02/2022 1.6  1.0 - 2.5 (calc) Final   Total Bilirubin 08/02/2022 0.9  0.2 - 1.2 mg/dL Final   Alkaline phosphatase (APISO) 08/02/2022 69  35 - 144 U/L Final   AST 08/02/2022 21  10 - 35 U/L Final   ALT 08/02/2022 22  9 - 46 U/L Final   Cholesterol 08/02/2022 139  <200 mg/dL Final   HDL 08/02/2022 63  > OR = 40 mg/dL Final   Triglycerides 08/02/2022 68  <150 mg/dL Final   LDL Cholesterol (Calc) 08/02/2022 62  mg/dL (calc) Final   Comment: Reference range: <100 . Desirable range <100 mg/dL for primary prevention;   <70 mg/dL for patients with CHD or diabetic patients  with > or = 2 CHD risk factors. Marland Kitchen LDL-C is now calculated using the Martin-Hopkins  calculation, which is a validated novel method providing  better accuracy than the Friedewald equation in the  estimation of LDL-C.  Cresenciano Genre et al. Annamaria Helling. 1914;782(95): 2061-2068   (http://education.[facility].com/faq/FAQ164)    Total CHOL/HDL Ratio 08/02/2022 2.2  <5.0 (calc) Final   Non-HDL Cholesterol (Calc) 08/02/2022 76  <130 mg/dL (calc) Final   Comment: For patients with diabetes plus 1 major ASCVD risk  factor, treating to a non-HDL-C goal of <100 mg/dL  (LDL-C of <70 mg/dL) is considered a therapeutic  option.    Lipase 08/02/2022 18  7 - 60 U/L Final   H. pylori Breath Test 08/02/2022 NOT DETECTED  NOT DETECTED Final   Comment: . Antimicrobials, proton pump inhibitors, and bismuth preparations are known to suppress H. pylori, and  ingestion of these prior to H. pylori diagnostic testing may lead to false negative results. If clinically  indicated, the test may be repeated on a new specimen obtained two weeks after discontinuing treatment. However, a positive result is still clinically valid.    All were normal.  Here to discuss next steps.  Patient states however that he is doing much better now.  After having taken 1 month of the Protonix, the pain is 70% better.  He has been off all NSAIDs.  He is longer having any pain he states that 3 days or so he will feel "rumbling" in his stomach like he is hungry.  However he denies any melena, hematochezia, nausea, vomiting, or intense pain Past Medical History:  Diagnosis Date   Arthritis    Ascending aorta dilation (HCC)    Blood in stool    Hemorrhoids    History of kidney stones    Hyperlipidemia    Hypertension    Pre-diabetes    Rectal fissure    Rectal pain    Skin cancer    Past Surgical History:  Procedure Laterality Date   HERNIA REPAIR  2000 (approx)   IR RADIOLOGIST EVAL & MGMT  11/09/2021   IR RADIOLOGIST EVAL & MGMT  01/15/2022   lower back surgery  05/1995, 05/1998   RADIOLOGY WITH ANESTHESIA Left 12/20/2021   Procedure: CT WITH ANESTHESIA CRYOABLATION;  Surgeon: Markus Daft, MD;  Location: [facility];  Service: Anesthesiology;  Laterality: Left;   SHOULDER SURGERY  1997   both    Current Outpatient Medications on File Prior to Visit  Medication Sig Dispense Refill   aspirin EC 81 MG tablet Take 81 mg by mouth daily. Swallow whole.     lisinopril-hydrochlorothiazide (ZESTORETIC) 10-12.5 MG tablet Take  1 tablet by mouth twice daily 180 tablet 0   metoprolol tartrate (LOPRESSOR) 25 MG tablet Take 1 tablet by mouth twice daily 180 tablet 0   Multiple Vitamins-Minerals (CENTRUM SILVER ADULT 50+ PO) Take 1 tablet by mouth daily.     pantoprazole (PROTONIX) 40 MG tablet Take 1 tablet (40 mg total) by mouth daily. 30 tablet 3   simvastatin (ZOCOR) 20 MG tablet TAKE 1 TABLET BY MOUTH ONCE DAILY AT BEDTIME . APPOINTMENT REQUIRED FOR FUTURE REFILLS 90 tablet 3   No current facility-administered medications on file prior to visit.   Allergies  Allergen Reactions   Doxycycline     GI upset    Social History   Socioeconomic History   Marital status: Widowed    Spouse name: Not on file   Number of children: Not on file   Years of education: Not on file   Highest education level: Not on file  Occupational History   Not on file  Tobacco Use   Smoking status: Never   Smokeless tobacco: Never  Vaping Use   Vaping Use: Never used  Substance and Sexual Activity   Alcohol use: No   Drug use: No   Sexual activity: Not on file  Other Topics Concern   Not on file  Social History Narrative   Not on file   Social Determinants of Health   Financial Resource Strain: Low Risk  (04/21/2021)   Overall Financial Resource Strain (CARDIA)    Difficulty of Paying Living Expenses: Not hard at all  Food Insecurity: No Food Insecurity (04/21/2021)   Hunger Vital Sign    Worried About Running Out of Food in the Last Year: Never true    [place] in the Last Year: Never true  Transportation Needs: No Transportation Needs (04/21/2021)   PRAPARE - Hydrologist (Medical): No    Lack of Transportation (Non-Medical): No  Physical Activity:  Sufficiently Active (04/21/2021)   Exercise Vital Sign    Days of Exercise per Week: 5 days    Minutes of Exercise per Session: 50 min  Stress: No Stress Concern Present (04/21/2021)   [place]    Feeling of Stress : Not at all  Social Connections: Moderately Integrated (04/21/2021)   Social Connection and Isolation Panel [NHANES]    Frequency of Communication with Friends and Family: More than three times a week    Frequency of Social Gatherings with Friends and Family: More than three times a week    Attends Religious Services: More than 4 times per year    Active Member of [employer] or Organizations: Yes    Attends Archivist Meetings: More than 4 times per year    Marital Status: Widowed  Intimate Partner Violence: Not At Risk (04/21/2021)   Humiliation, Afraid, Rape, and Kick questionnaire    Fear of Current or Ex-Partner: No    Emotionally Abused: No    Physically Abused: No    Sexually Abused: No      Review of Systems  All other systems reviewed and are negative.      Objective:   Physical Exam Vitals reviewed.  Constitutional:      Appearance: Normal appearance. He is normal weight.  HENT:     Head: Normocephalic and atraumatic.  Cardiovascular:     Rate and Rhythm: Normal rate and regular rhythm.     Heart sounds: Normal heart sounds. No  murmur heard. Pulmonary:     Effort: Pulmonary effort is normal. No respiratory distress.     Breath sounds: Normal breath sounds. No wheezing, rhonchi or rales.  Chest:  Breasts:    Right: Normal. No inverted nipple, mass, nipple discharge or skin change.     Left: Normal. No inverted nipple, mass, nipple discharge or skin change.  Abdominal:     General: Abdomen is flat. Bowel sounds are normal.     Palpations: Abdomen is soft. There is no hepatomegaly or mass.     Tenderness: There is no abdominal tenderness. There is no guarding or rebound.   Neurological:     General: No focal deficit present.     Mental Status: He is alert. Mental status is at baseline.     Cranial Nerves: No cranial nerve deficit.     Sensory: No sensory deficit.     Motor: No weakness.     Coordination: Coordination normal.     Gait: Gait normal.           Assessment & Plan:  Epigastric pain Labs are normal and after 4 weeks, the patient is doing 70% better per his own report.  I feel that he likely had gastritis brought on by NSAID use.  I recommended avoiding NSAIDs and continuing Protonix for 4 more weeks.  I believe this will allow total resolution of his symptoms and then he can discontinue the Protonix.  If symptoms or not improving at that point I would recommend an EGD.

## 2022-08-14 ENCOUNTER — Ambulatory Visit (INDEPENDENT_AMBULATORY_CARE_PROVIDER_SITE_OTHER): Payer: Medicare Other

## 2022-08-14 VITALS — Ht 74.0 in | Wt 212.0 lb

## 2022-08-14 DIAGNOSIS — Z0000 Encounter for general adult medical examination without abnormal findings: Secondary | ICD-10-CM | POA: Diagnosis not present

## 2022-08-14 NOTE — Patient Instructions (Addendum)
[name] , Thank you for taking time to come for your Medicare Wellness Visit. I appreciate your ongoing commitment to your health goals. Please review the following plan we discussed and let me know if I can assist you in the future.   These are the goals we discussed:  Goals      Remain active and independent        This is a list of the screening recommended for you and due dates:  Health Maintenance  Topic Date Due   COVID-19 Vaccine (3 - Pfizer risk series) 10/01/2022*   Zoster (Shingles) Vaccine (1 of 2) 11/07/2022*   Medicare Annual Wellness Visit  08/15/2023   DTaP/Tdap/Td vaccine (2 - Td or Tdap) 01/13/2032   Pneumonia Vaccine  Completed   Flu Shot  Completed   HPV Vaccine  Aged Out  *Topic was postponed. The date shown is not the original due date.    Advanced directives: Please bring a copy of your health care power of attorney and living will to the office to be added to your chart at your convenience.   Conditions/risks identified: Aim for 30 minutes of exercise or brisk walking, 6-8 glasses of water, and 5 servings of fruits and vegetables each day.   Next appointment: Follow up in one year for your annual wellness visit.   Preventive Care 5 Years and Older, Male  Preventive care refers to lifestyle choices and visits with your health care provider that can promote health and wellness. What does preventive care include? A yearly physical exam. This is also called an annual well check. Dental exams once or twice a year. Routine eye exams. Ask your health care provider how often you should have your eyes checked. Personal lifestyle choices, including: Daily care of your teeth and gums. Regular physical activity. Eating a healthy diet. Avoiding tobacco and drug use. Limiting alcohol use. Practicing safe sex. Taking low doses of aspirin every day. Taking vitamin and mineral supplements as recommended by your health care provider. What happens during an annual  well check? The services and screenings done by your health care provider during your annual well check will depend on your age, overall health, lifestyle risk factors, and family history of disease. Counseling  Your health care provider may ask you questions about your: Alcohol use. Tobacco use. Drug use. Emotional well-being. Home and relationship well-being. Sexual activity. Eating habits. History of falls. Memory and ability to understand (cognition). Work and work Statistician. Screening  You may have the following tests or measurements: Height, weight, and BMI. Blood pressure. Lipid and cholesterol levels. These may be checked every 5 years, or more frequently if you are over 2 years old. Skin check. Lung cancer screening. You may have this screening every year starting at age 9 if you have a 30-pack-year history of smoking and currently smoke or have quit within the past 15 years. Fecal occult blood test (FOBT) of the stool. You may have this test every year starting at age 73. Flexible sigmoidoscopy or colonoscopy. You may have a sigmoidoscopy every 5 years or a colonoscopy every 10 years starting at age 35. Prostate cancer screening. Recommendations will vary depending on your family history and other risks. Hepatitis C blood test. Hepatitis B blood test. Sexually transmitted disease (STD) testing. Diabetes screening. This is done by checking your blood sugar (glucose) after you have not eaten for a while (fasting). You may have this done every 1-3 years. Abdominal aortic aneurysm (AAA) screening. You may  need this if you are a current or former smoker. Osteoporosis. You may be screened starting at age 54 if you are at high risk. Talk with your health care provider about your test results, treatment options, and if necessary, the need for more tests. Vaccines  Your health care provider may recommend certain vaccines, such as: Influenza vaccine. This is recommended every  year. Tetanus, diphtheria, and acellular pertussis (Tdap, Td) vaccine. You may need a Td booster every 10 years. Zoster vaccine. You may need this after age 90. Pneumococcal 13-valent conjugate (PCV13) vaccine. One dose is recommended after age 91. Pneumococcal polysaccharide (PPSV23) vaccine. One dose is recommended after age 63. Talk to your health care provider about which screenings and vaccines you need and how often you need them. This information is not intended to replace advice given to you by your health care provider. Make sure you discuss any questions you have with your health care provider. Document Released: 07/15/2015 Document Revised: 03/07/2016 Document Reviewed: 04/19/2015 Elsevier Interactive Patient Education  2017 [place] Prevention in the Home Falls can cause injuries. They can happen to people of all ages. There are many things you can do to make your home safe and to help prevent falls. What can I do on the outside of my home? Regularly fix the edges of walkways and driveways and fix any cracks. Remove anything that might make you trip as you walk through a door, such as a raised step or threshold. Trim any bushes or trees on the path to your home. Use bright outdoor lighting. Clear any walking paths of anything that might make someone trip, such as rocks or tools. Regularly check to see if handrails are loose or broken. Make sure that both sides of any steps have handrails. Any raised decks and porches should have guardrails on the edges. Have any leaves, snow, or ice cleared regularly. Use sand or salt on walking paths during winter. Clean up any spills in your garage right away. This includes oil or grease spills. What can I do in the bathroom? Use night lights. Install grab bars by the toilet and in the tub and shower. Do not use towel bars as grab bars. Use non-skid mats or decals in the tub or shower. If you need to sit down in the shower, use a  plastic, non-slip stool. Keep the floor dry. Clean up any water that spills on the floor as soon as it happens. Remove soap buildup in the tub or shower regularly. Attach bath mats securely with double-sided non-slip rug tape. Do not have throw rugs and other things on the floor that can make you trip. What can I do in the bedroom? Use night lights. Make sure that you have a light by your bed that is easy to reach. Do not use any sheets or blankets that are too big for your bed. They should not hang down onto the floor. Have a firm chair that has side arms. You can use this for support while you get dressed. Do not have throw rugs and other things on the floor that can make you trip. What can I do in the kitchen? Clean up any spills right away. Avoid walking on wet floors. Keep items that you use a lot in easy-to-reach places. If you need to reach something above you, use a strong step stool that has a grab bar. Keep electrical cords out of the way. Do not use floor polish or wax that makes  floors slippery. If you must use wax, use non-skid floor wax. Do not have throw rugs and other things on the floor that can make you trip. What can I do with my stairs? Do not leave any items on the stairs. Make sure that there are handrails on both sides of the stairs and use them. Fix handrails that are broken or loose. Make sure that handrails are as long as the stairways. Check any carpeting to make sure that it is firmly attached to the stairs. Fix any carpet that is loose or worn. Avoid having throw rugs at the top or bottom of the stairs. If you do have throw rugs, attach them to the floor with carpet tape. Make sure that you have a light switch at the top of the stairs and the bottom of the stairs. If you do not have them, ask someone to add them for you. What else can I do to help prevent falls? Wear shoes that: Do not have high heels. Have rubber bottoms. Are comfortable and fit you  well. Are closed at the toe. Do not wear sandals. If you use a stepladder: Make sure that it is fully opened. Do not climb a closed stepladder. Make sure that both sides of the stepladder are locked into place. Ask someone to hold it for you, if possible. Clearly mark and make sure that you can see: Any grab bars or handrails. First and last steps. Where the edge of each step is. Use tools that help you move around (mobility aids) if they are needed. These include: Canes. Walkers. Scooters. Crutches. Turn on the lights when you go into a dark area. Replace any light bulbs as soon as they burn out. Set up your furniture so you have a clear path. Avoid moving your furniture around. If any of your floors are uneven, fix them. If there are any pets around you, be aware of where they are. Review your medicines with your doctor. Some medicines can make you feel dizzy. This can increase your chance of falling. Ask your doctor what other things that you can do to help prevent falls. This information is not intended to replace advice given to you by your health care provider. Make sure you discuss any questions you have with your health care provider. Document Released: 04/14/2009 Document Revised: 11/24/2015 Document Reviewed: 07/23/2014 Elsevier Interactive Patient Education  2017 [employer].

## 2022-08-14 NOTE — Progress Notes (Signed)
Subjective:   Jesse Fletcher. is a 88 y.o. male who presents for Medicare Annual/Subsequent preventive examination.  I connected with  Jesse Fletcher. on 08/14/22 by a audio enabled telemedicine application and verified that I am speaking with the correct person using two identifiers.  Patient Location: Home  Provider Location: Office/Clinic  I discussed the limitations of evaluation and management by telemedicine. The patient expressed understanding and agreed to proceed.  Review of Systems     Cardiac Risk Factors include: advanced age (>21mn, >>71women);hypertension;male gender;dyslipidemia     Objective:    Today's Vitals   08/14/22 1446  Weight: 212 lb (96.2 kg)  Height: 6' 2"$  (1.88 m)   Body mass index is 27.22 kg/m.     08/14/2022    2:49 PM 12/20/2021    7:55 AM 12/07/2021   11:10 AM 04/21/2021   11:17 AM 03/10/2020   10:33 AM  Advanced Directives  Does Patient Have a Medical Advance Directive? Yes No No No Yes  Type of Advance Directive Living will;Healthcare Power of [place] will  Does patient want to make changes to medical advance directive? No - Patient declined      Copy of [place] Chart? No - copy requested      Would patient like information on creating a medical advance directive?   No - Patient declined No - Patient declined     Current Medications (verified) Outpatient Encounter Medications as of 08/14/2022  Medication Sig   aspirin EC 81 MG tablet Take 81 mg by mouth daily. Swallow whole.   lisinopril-hydrochlorothiazide (ZESTORETIC) 10-12.5 MG tablet Take 1 tablet by mouth twice daily   metoprolol tartrate (LOPRESSOR) 25 MG tablet Take 1 tablet by mouth twice daily   Multiple Vitamins-Minerals (CENTRUM SILVER ADULT 50+ PO) Take 1 tablet by mouth daily.   pantoprazole (PROTONIX) 40 MG tablet Take 1 tablet (40 mg total) by mouth daily.   simvastatin (ZOCOR) 20 MG tablet TAKE 1  TABLET BY MOUTH ONCE DAILY AT BEDTIME . APPOINTMENT REQUIRED FOR FUTURE REFILLS   No facility-administered encounter medications on file as of 08/14/2022.    Allergies (verified) Doxycycline   History: Past Medical History:  Diagnosis Date   Arthritis    Ascending aorta dilation (HCC)    Blood in stool    Hemorrhoids    History of kidney stones    Hyperlipidemia    Hypertension    Pre-diabetes    Rectal fissure    Rectal pain    Skin cancer    Past Surgical History:  Procedure Laterality Date   HERNIA REPAIR  2000 (approx)   IR RADIOLOGIST EVAL & MGMT  11/09/2021   IR RADIOLOGIST EVAL & MGMT  01/15/2022   lower back surgery  05/1995, 05/1998   RADIOLOGY WITH ANESTHESIA Left 12/20/2021   Procedure: CT WITH ANESTHESIA CRYOABLATION;  Surgeon: Jesse Daft MD;  Location: [facility];  Service: Anesthesiology;  Laterality: Left;   [place]  both   History reviewed. No pertinent family history. Social History   Socioeconomic History   Marital status: Widowed    Spouse name: Not on file   Number of children: Not on file   Years of education: Not on file   Highest education level: Not on file  Occupational History   Not on file  Tobacco Use   Smoking status: Never   Smokeless tobacco: Never  Vaping  Use   Vaping Use: Never used  Substance and Sexual Activity   Alcohol use: No   Drug use: No   Sexual activity: Not on file  Other Topics Concern   Not on file  Social History Narrative   Not on file   Social Determinants of Health   Financial Resource Strain: Low Risk  (08/14/2022)   Overall Financial Resource Strain (CARDIA)    Difficulty of Paying Living Expenses: Not hard at all  Food Insecurity: No Food Insecurity (08/14/2022)   Hunger Vital Sign    Worried About Running Out of Food in the Last Year: Never true    Ran Out of Food in the Last Year: Never true  Transportation Needs: No Transportation Needs (08/14/2022)   PRAPARE - Radiographer, therapeutic (Medical): No    Lack of Transportation (Non-Medical): No  Physical Activity: Sufficiently Active (08/14/2022)   Exercise Vital Sign    Days of Exercise per Week: 5 days    Minutes of Exercise per Session: 60 min  Stress: No Stress Concern Present (08/14/2022)   [place]    Feeling of Stress : Not at all  Social Connections: Moderately Integrated (08/14/2022)   Social Connection and Isolation Panel [NHANES]    Frequency of Communication with Friends and Family: More than three times a week    Frequency of Social Gatherings with Friends and Family: Three times a week    Attends Religious Services: More than 4 times per year    Active Member of Clubs or Organizations: Yes    Attends Archivist Meetings: More than 4 times per year    Marital Status: Widowed    Tobacco Counseling Counseling given: Not Answered   Clinical Intake:  Pre-visit preparation completed: Yes  Pain : No/denies pain  Diabetes: No  How often do you need to have someone help you when you read instructions, pamphlets, or other written materials from your doctor or pharmacy?: 1 - Never  Diabetic?No   Interpreter Needed?: No  Information entered by :: Jesse George LPN   Activities of Daily Living    08/14/2022    2:49 PM 12/20/2021    7:52 AM  In your present state of health, do you have any difficulty performing the following activities:  Hearing? 0   Vision? 0 0  Difficulty concentrating or making decisions? 0 0  Walking or climbing stairs? 0 0  Dressing or bathing? 0 0  Doing errands, shopping? 0   Preparing Food and eating ? N   Using the Toilet? N   In the past six months, have you accidently leaked urine? N   Do you have problems with loss of bowel control? N   Managing your Medications? N   Managing your Finances? N   Housekeeping or managing your Housekeeping? N     Patient Care Team: Jesse Frizzle, MD as PCP - General (Family Medicine) Jesse Merritts, MD as PCP - Cardiology (Cardiology) Jesse Seal, MD as Attending Physician (Urology)  Indicate any recent Medical Services you may have received from other than [facility] providers in the past year (date may be approximate).     Assessment:   This is a routine wellness examination for Jesse Fletcher.  Hearing/Vision screen Hearing Screening - Comments:: Denies hearing difficulties  Vision Screening - Comments:: No vision problems; will schedule routine eye exam soon    Dietary issues and exercise activities discussed:  Current Exercise Habits: The patient has a physically strenuous job, but has no regular exercise apart from work.   Goals Addressed             This Visit's Progress    Remain active and independent         Depression Screen    08/14/2022    2:48 PM 08/09/2022    4:26 PM 04/30/2022    3:29 PM 08/10/2021   10:27 AM 04/21/2021   11:14 AM 03/10/2020   10:34 AM 03/06/2019   11:02 AM  PHQ 2/9 Scores  PHQ - 2 Score 0 0 0 0 0 0 0  PHQ- 9 Score    0       Fall Risk    08/14/2022    2:47 PM 08/09/2022    4:26 PM 04/30/2022    3:29 PM 08/10/2021   10:26 AM 04/21/2021   11:18 AM  [place] in the past year? 0 1 0 0 0  Number falls in past yr: 0 0 0 0 0  Injury with Fall? 0 0 0 0 0  Risk for fall due to : No Fall Risks History of fall(s);Impaired balance/gait No Fall Risks  No Fall Risks  Follow up Falls prevention discussed;Education provided;Falls evaluation completed Education provided;Falls prevention discussed Falls prevention discussed  Falls prevention discussed    FALL RISK PREVENTION PERTAINING TO THE HOME:  Any stairs in or around the home? No  If so, are there any without handrails? No  Home free of loose throw rugs in walkways, pet beds, electrical cords, etc? Yes  Adequate lighting in your home to reduce risk of falls? Yes   ASSISTIVE DEVICES UTILIZED TO PREVENT FALLS:  Life alert? No   Use of a cane, walker or w/c? No  Grab bars in the bathroom? Yes  Shower chair or bench in shower? No  Elevated toilet seat or a handicapped toilet? Yes   TIMED UP AND GO:  Was the test performed? No . Telephonic visit   Cognitive Function:        08/14/2022    2:50 PM 04/21/2021   11:20 AM 03/10/2020   10:35 AM  6CIT Screen  What Year? 0 points  0 points  What month? 0 points  0 points  What time? 0 points 0 points 0 points  Count back from 20 0 points 0 points 0 points  Months in reverse 0 points 0 points 0 points  Repeat phrase 0 points 2 points 0 points  Total Score 0 points  0 points    Immunizations Immunization History  Administered Date(s) Administered   Fluad Quad(high Dose 65+) 03/06/2019, 03/10/2020, 03/28/2021   Influenza, High Dose Seasonal PF 04/19/2017   Influenza,inj,Quad PF,6+ Mos 04/08/2013, 03/29/2014, 03/25/2015, 04/13/2016, 05/01/2018   Influenza-Unspecified 04/06/2022   PFIZER(Purple Top)SARS-COV-2 Vaccination 07/21/2019, 08/10/2019   Pneumococcal Conjugate-13 10/30/2013   Pneumococcal Polysaccharide-23 11/10/2010   Tdap 01/12/2022    TDAP status: Due, Education has been provided regarding the importance of this vaccine. Advised may receive this vaccine at local pharmacy or Health Dept. Aware to provide a copy of the vaccination record if obtained from local pharmacy or Health Dept. Verbalized acceptance and understanding.  Flu Vaccine status: Up to date  Pneumococcal vaccine status: Up to date  Covid-19 vaccine status: Information provided on how to obtain vaccines.   Qualifies for Shingles Vaccine? Yes   Zostavax completed No   Shingrix Completed?: No.    Education has  been provided regarding the importance of this vaccine. Patient has been advised to call insurance company to determine out of pocket expense if they have not yet received this vaccine. Advised may also receive vaccine at local pharmacy or Health Dept. Verbalized acceptance and  understanding.  Screening Tests Health Maintenance  Topic Date Due   COVID-19 Vaccine (3 - Pfizer risk series) 10/01/2022 (Originally 09/07/2019)   Zoster Vaccines- Shingrix (1 of 2) 11/07/2022 (Originally 07/20/1953)   Medicare Annual Wellness (AWV)  08/15/2023   DTaP/Tdap/Td (2 - Td or Tdap) 01/13/2032   Pneumonia Vaccine 30+ Years old  Completed   INFLUENZA VACCINE  Completed   HPV VACCINES  Aged Out    Health Maintenance  There are no preventive care reminders to display for this patient.   Colorectal cancer screening: No longer required.   Lung Cancer Screening: (Low Dose CT Chest recommended if Age 18-80 years, 30 pack-year currently smoking OR have quit w/in 15years.) does not qualify.   Lung Cancer Screening Referral: n/a   Additional Screening:  Hepatitis C Screening: does not qualify  Vision Screening: Recommended annual ophthalmology exams for early detection of glaucoma and other disorders of the eye. Is the patient up to date with their annual eye exam?  No  Who is the provider or what is the name of the office in which the patient attends annual eye exams? None  If pt is not established with a provider, would they like to be referred to a provider to establish care? No .   Dental Screening: Recommended annual dental exams for proper oral hygiene  Community Resource Referral / Chronic Care Management: CRR required this visit?  No   CCM required this visit?  No      Plan:     I have personally reviewed and noted the following in the patient's chart:   Medical and social history Use of alcohol, tobacco or illicit drugs  Current medications and supplements including opioid prescriptions. Patient is not currently taking opioid prescriptions. Functional ability and status Nutritional status Physical activity Advanced directives List of other physicians Hospitalizations, surgeries, and ER visits in previous 12 months Vitals Screenings to include  cognitive, depression, and falls Referrals and appointments  In addition, I have reviewed and discussed with patient certain preventive protocols, quality metrics, and best practice recommendations. A written personalized care plan for preventive services as well as general preventive health recommendations were provided to patient.     Vanetta Mulders, [place]   [id number]   Due to this being a virtual visit, the after visit summary with patients personalized plan was offered to patient via mail or my-chart. Patient declined at this time  Nurse Notes: No concerns

## 2022-08-21 ENCOUNTER — Encounter: Payer: Self-pay | Admitting: Family Medicine

## 2022-08-21 ENCOUNTER — Ambulatory Visit (INDEPENDENT_AMBULATORY_CARE_PROVIDER_SITE_OTHER): Payer: Medicare Other | Admitting: Family Medicine

## 2022-08-21 VITALS — BP 118/62 | HR 61 | Temp 97.6°F | Ht 74.0 in | Wt 213.0 lb

## 2022-08-21 DIAGNOSIS — H60392 Other infective otitis externa, left ear: Secondary | ICD-10-CM

## 2022-08-21 DIAGNOSIS — T162XXA Foreign body in left ear, initial encounter: Secondary | ICD-10-CM

## 2022-08-21 MED ORDER — NEOMYCIN-POLYMYXIN-HC 3.5-10000-1 OT SOLN: 3.0000 [drp] | Freq: Four times a day (QID) | OTIC | 0 refills | Status: AC

## 2022-08-21 NOTE — Progress Notes (Signed)
Subjective:    Patient ID: Jesse Fletcher., male    DOB: Jun 04, 1935, 88 y.o.   MRN: [id number] Patient states that he developed severe pain in his left ear starting Sunday.  It hurts to touch his outer ear.  He states that he tried to clean his ear out with a Q-tip and caused severe pain.  He normally wears hearing aids.  On examination today, there is the end of the hearing aid lodged distally in his auditory canal.  The canal itself is erythematous and swollen with purulent drainage in the floor the auditory canal.  There is significant pain simply inserting the otoscope into his auditory canal Past Medical History:  Diagnosis Date  . Arthritis   . Ascending aorta dilation (HCC)   . Blood in stool   . Hemorrhoids   . History of kidney stones   . Hyperlipidemia   . Hypertension   . Pre-diabetes   . Rectal fissure   . Rectal pain   . Skin cancer    Past Surgical History:  Procedure Laterality Date  . HERNIA REPAIR  2000 (approx)  . IR RADIOLOGIST EVAL & MGMT  11/09/2021  . IR RADIOLOGIST EVAL & MGMT  01/15/2022  . lower back surgery  05/1995, 05/1998  . RADIOLOGY WITH ANESTHESIA Left 12/20/2021   Procedure: CT WITH ANESTHESIA CRYOABLATION;  Surgeon: Markus Daft, MD;  Location: [facility];  Service: Anesthesiology;  Laterality: Left;  . SHOULDER SURGERY  1997   both   Current Outpatient Medications on File Prior to Visit  Medication Sig Dispense Refill  . aspirin EC 81 MG tablet Take 81 mg by mouth daily. Swallow whole.    . lisinopril-hydrochlorothiazide (ZESTORETIC) 10-12.5 MG tablet Take 1 tablet by mouth twice daily 180 tablet 0  . metoprolol tartrate (LOPRESSOR) 25 MG tablet Take 1 tablet by mouth twice daily 180 tablet 0  . Multiple Vitamins-Minerals (CENTRUM SILVER ADULT 50+ PO) Take 1 tablet by mouth daily.    . pantoprazole (PROTONIX) 40 MG tablet Take 1 tablet (40 mg total) by mouth daily. 30 tablet 3  . simvastatin (ZOCOR) 20 MG tablet TAKE 1 TABLET BY MOUTH ONCE DAILY AT  BEDTIME . APPOINTMENT REQUIRED FOR FUTURE REFILLS 90 tablet 3   No current facility-administered medications on file prior to visit.   Allergies  Allergen Reactions  . Doxycycline     GI upset    Social History   Socioeconomic History  . Marital status: Widowed    Spouse name: Not on file  . Number of children: Not on file  . Years of education: Not on file  . Highest education level: Not on file  Occupational History  . Not on file  Tobacco Use  . Smoking status: Never  . Smokeless tobacco: Never  Vaping Use  . Vaping Use: Never used  Substance and Sexual Activity  . Alcohol use: No  . Drug use: No  . Sexual activity: Not on file  Other Topics Concern  . Not on file  Social History Narrative  . Not on file   Social Determinants of Health   Financial Resource Strain: Low Risk  (08/14/2022)   Overall Financial Resource Strain (CARDIA)   . Difficulty of Paying Living Expenses: Not hard at all  Food Insecurity: No Food Insecurity (08/14/2022)   Hunger Vital Sign   . Worried About Charity fundraiser in the Last Year: Never true   . Ran Out of Food in the Last Year:  Never true  Transportation Needs: No Transportation Needs (08/14/2022)   PRAPARE - Transportation   . Lack of Transportation (Medical): No   . Lack of Transportation (Non-Medical): No  Physical Activity: Sufficiently Active (08/14/2022)   Exercise Vital Sign   . Days of Exercise per Week: 5 days   . Minutes of Exercise per Session: 60 min  Stress: No Stress Concern Present (08/14/2022)   [place]   . Feeling of Stress : Not at all  Social Connections: Moderately Integrated (08/14/2022)   Social Connection and Isolation Panel [NHANES]   . Frequency of Communication with Friends and Family: More than three times a week   . Frequency of Social Gatherings with Friends and Family: Three times a week   . Attends Religious Services: More than 4  times per year   . Active Member of Clubs or Organizations: Yes   . Attends Archivist Meetings: More than 4 times per year   . Marital Status: Widowed  Intimate Partner Violence: Not At Risk (08/14/2022)   Humiliation, Afraid, Rape, and Kick questionnaire   . Fear of Current or Ex-Partner: No   . Emotionally Abused: No   . Physically Abused: No   . Sexually Abused: No      Review of Systems  All other systems reviewed and are negative.      Objective:   Physical Exam Vitals reviewed.  Constitutional:      Appearance: Normal appearance. He is normal weight.  HENT:     Head: Normocephalic and atraumatic.     Left Ear: Drainage, swelling and tenderness present. A foreign body is present.  Cardiovascular:     Rate and Rhythm: Normal rate and regular rhythm.     Heart sounds: Normal heart sounds. No murmur heard. Pulmonary:     Effort: Pulmonary effort is normal. No respiratory distress.     Breath sounds: Normal breath sounds. No wheezing, rhonchi or rales.  Chest:  Breasts:    Right: Normal. No inverted nipple, mass, nipple discharge or skin change.     Left: Normal. No inverted nipple, mass, nipple discharge or skin change.  Abdominal:     Palpations: There is no hepatomegaly.  Neurological:     Mental Status: He is alert.          Assessment & Plan:  Foreign body in auricle of left ear, initial encounter  Other infective acute otitis externa of left ear I was able to remove the end of the hearing aid using a pair of alligator forceps.  I will treat the secondary otitis externa with Cortisporin HC otic drops.  Insert 3 drops in the ear 4 times a day for the next 5 days.  This should alleviate the pain and the swelling and the secondary infection.

## 2022-09-03 ENCOUNTER — Encounter: Payer: Self-pay | Admitting: Family Medicine

## 2022-09-03 ENCOUNTER — Ambulatory Visit (INDEPENDENT_AMBULATORY_CARE_PROVIDER_SITE_OTHER): Payer: Medicare Other | Admitting: Family Medicine

## 2022-09-03 VITALS — BP 120/62 | HR 63 | Temp 98.6°F | Ht 74.0 in | Wt 215.0 lb

## 2022-09-03 DIAGNOSIS — J019 Acute sinusitis, unspecified: Secondary | ICD-10-CM

## 2022-09-03 MED ORDER — AMOXICILLIN 875 MG PO TABS: 875.0000 mg | ORAL_TABLET | Freq: Two times a day (BID) | ORAL | 0 refills | Status: AC

## 2022-09-03 NOTE — Progress Notes (Signed)
Subjective:    Patient ID: Jesse Fletcher., male    DOB: 1934/09/17, 88 y.o.   MRN: [id number]  Patient reports a 5-day history of pain in his left frontal sinus.  He states that it started with rhinorrhea and head congestion.  The rhinorrhea has improved.  He denies any pain in his ears.  He denies any sore throat.  He denies any fever.  He denies any cough or shortness of breath or chest pain.  However he reports 6 out of 10 pain in his left frontal sinus.  He also has tenderness to percussion in that area. Past Medical History:  Diagnosis Date   Arthritis    Ascending aorta dilation (HCC)    Blood in stool    Hemorrhoids    History of kidney stones    Hyperlipidemia    Hypertension    Pre-diabetes    Rectal fissure    Rectal pain    Skin cancer    Past Surgical History:  Procedure Laterality Date   HERNIA REPAIR  2000 (approx)   IR RADIOLOGIST EVAL & MGMT  11/09/2021   IR RADIOLOGIST EVAL & MGMT  01/15/2022   lower back surgery  05/1995, 05/1998   RADIOLOGY WITH ANESTHESIA Left 12/20/2021   Procedure: CT WITH ANESTHESIA CRYOABLATION;  Surgeon: Markus Daft, MD;  Location: [facility];  Service: Anesthesiology;  Laterality: Left;   SHOULDER SURGERY  1997   both   Current Outpatient Medications on File Prior to Visit  Medication Sig Dispense Refill   aspirin EC 81 MG tablet Take 81 mg by mouth daily. Swallow whole.     lisinopril-hydrochlorothiazide (ZESTORETIC) 10-12.5 MG tablet Take 1 tablet by mouth twice daily 180 tablet 0   metoprolol tartrate (LOPRESSOR) 25 MG tablet Take 1 tablet by mouth twice daily 180 tablet 0   Multiple Vitamins-Minerals (CENTRUM SILVER ADULT 50+ PO) Take 1 tablet by mouth daily.     neomycin-polymyxin-hydrocortisone (CORTISPORIN) OTIC solution Place 3 drops into the left ear 4 (four) times daily. 10 mL 0   pantoprazole (PROTONIX) 40 MG tablet Take 1 tablet (40 mg total) by mouth daily. 30 tablet 3   simvastatin (ZOCOR) 20 MG tablet TAKE 1 TABLET BY MOUTH  ONCE DAILY AT BEDTIME . APPOINTMENT REQUIRED FOR FUTURE REFILLS 90 tablet 3   No current facility-administered medications on file prior to visit.   Allergies  Allergen Reactions   Doxycycline     GI upset    Social History   Socioeconomic History   Marital status: Widowed    Spouse name: Not on file   Number of children: Not on file   Years of education: Not on file   Highest education level: Not on file  Occupational History   Not on file  Tobacco Use   Smoking status: Never   Smokeless tobacco: Never  Vaping Use   Vaping Use: Never used  Substance and Sexual Activity   Alcohol use: No   Drug use: No   Sexual activity: Not on file  Other Topics Concern   Not on file  Social History Narrative   Not on file   Social Determinants of Health   Financial Resource Strain: Low Risk  (08/14/2022)   Overall Financial Resource Strain (CARDIA)    Difficulty of Paying Living Expenses: Not hard at all  Food Insecurity: No Food Insecurity (08/14/2022)   Hunger Vital Sign    Worried About Running Out of Food in the Last Year: Never true  Ran Out of Food in the Last Year: Never true  Transportation Needs: No Transportation Needs (08/14/2022)   PRAPARE - Hydrologist (Medical): No    Lack of Transportation (Non-Medical): No  Physical Activity: Sufficiently Active (08/14/2022)   Exercise Vital Sign    Days of Exercise per Week: 5 days    Minutes of Exercise per Session: 60 min  Stress: No Stress Concern Present (08/14/2022)   [place]    Feeling of Stress : Not at all  Social Connections: Moderately Integrated (08/14/2022)   Social Connection and Isolation Panel [NHANES]    Frequency of Communication with Friends and Family: More than three times a week    Frequency of Social Gatherings with Friends and Family: Three times a week    Attends Religious Services: More than 4 times per year     Active Member of Clubs or Organizations: Yes    Attends Archivist Meetings: More than 4 times per year    Marital Status: Widowed  Intimate Partner Violence: Not At Risk (08/14/2022)   Humiliation, Afraid, Rape, and Kick questionnaire    Fear of Current or Ex-Partner: No    Emotionally Abused: No    Physically Abused: No    Sexually Abused: No      Review of Systems  All other systems reviewed and are negative.      Objective:   Physical Exam Vitals reviewed.  Constitutional:      Appearance: Normal appearance. He is normal weight.  HENT:     Head: Normocephalic and atraumatic.     Right Ear: Tympanic membrane and ear canal normal. No drainage, swelling or tenderness.     Left Ear: Tympanic membrane and ear canal normal. No drainage, swelling or tenderness.     Nose:     Right Turbinates: Not enlarged or swollen.     Left Turbinates: Not enlarged or swollen.     Right Sinus: No maxillary sinus tenderness or frontal sinus tenderness.     Left Sinus: Frontal sinus tenderness present. No maxillary sinus tenderness.  Cardiovascular:     Rate and Rhythm: Normal rate and regular rhythm.     Heart sounds: Normal heart sounds. No murmur heard. Pulmonary:     Effort: Pulmonary effort is normal. No respiratory distress.     Breath sounds: Normal breath sounds. No wheezing, rhonchi or rales.  Chest:  Breasts:    Right: Normal. No inverted nipple, mass, nipple discharge or skin change.     Left: Normal. No inverted nipple, mass, nipple discharge or skin change.  Abdominal:     Palpations: There is no hepatomegaly.  Neurological:     Mental Status: He is alert.           Assessment & Plan:  Acute rhinosinusitis Begin amoxicillin 875 mg twice a day for 10 days

## 2022-09-12 DIAGNOSIS — D225 Melanocytic nevi of trunk: Secondary | ICD-10-CM | POA: Diagnosis not present

## 2022-09-12 DIAGNOSIS — L570 Actinic keratosis: Secondary | ICD-10-CM | POA: Diagnosis not present

## 2022-09-12 DIAGNOSIS — L821 Other seborrheic keratosis: Secondary | ICD-10-CM | POA: Diagnosis not present

## 2022-09-12 DIAGNOSIS — L538 Other specified erythematous conditions: Secondary | ICD-10-CM | POA: Diagnosis not present

## 2022-09-12 DIAGNOSIS — L814 Other melanin hyperpigmentation: Secondary | ICD-10-CM | POA: Diagnosis not present

## 2022-09-12 DIAGNOSIS — B351 Tinea unguium: Secondary | ICD-10-CM | POA: Diagnosis not present

## 2022-09-12 DIAGNOSIS — D485 Neoplasm of uncertain behavior of skin: Secondary | ICD-10-CM | POA: Diagnosis not present

## 2022-09-13 ENCOUNTER — Other Ambulatory Visit: Payer: Self-pay | Admitting: Family Medicine

## 2022-09-14 NOTE — Telephone Encounter (Signed)
Requested Prescriptions  Pending Prescriptions Disp Refills   simvastatin (ZOCOR) 20 MG tablet [Pharmacy Med Name: Simvastatin 20 MG Oral Tablet] 90 tablet 0    Sig: TAKE 1 TABLET BY MOUTH AT BEDTIME. APPOINTMENT  REQUIRED FOR FUTURE REFILLS.     Cardiovascular:  Antilipid - Statins Failed - 09/13/2022  7:08 PM      Failed - Valid encounter within last 12 months    Recent Outpatient Visits           1 year ago Constipation, unspecified constipation type   [place] Pickard, Cammie Mcgee, MD   1 year ago Constipation, unspecified constipation type   [place] Pickard, Cammie Mcgee, MD   1 year ago Encounter for Medicare annual wellness exam   [place] Susy Frizzle, MD   1 year ago Viral URI   [place] Eulogio Bear, NP   2 years ago Benign essential HTN   [place] Susy Frizzle, MD              Failed - Lipid Panel in normal range within the last 12 months    Cholesterol  Date Value Ref Range Status  08/02/2022 139 <200 mg/dL Final   LDL Cholesterol (Calc)  Date Value Ref Range Status  08/02/2022 62 mg/dL (calc) Final    Comment:    Reference range: <100 . Desirable range <100 mg/dL for primary prevention;   <70 mg/dL for patients with CHD or diabetic patients  with > or = 2 CHD risk factors. Marland Kitchen LDL-C is now calculated using the Martin-Hopkins  calculation, which is a validated novel method providing  better accuracy than the Friedewald equation in the  estimation of LDL-C.  Cresenciano Genre et al. Annamaria Helling. [id number]): 2061-2068  (http://education.[facility].com/faq/FAQ164)    HDL  Date Value Ref Range Status  08/02/2022 63 > OR = 40 mg/dL Final   Triglycerides  Date Value Ref Range Status  08/02/2022 68 <150 mg/dL Final         Passed - Patient is not pregnant       metoprolol tartrate (LOPRESSOR) 25 MG tablet [Pharmacy Med Name: Metoprolol Tartrate 25 MG  Oral Tablet] 180 tablet 0    Sig: TAKE 1 TABLET BY MOUTH TWICE DAILY . APPOINTMENT REQUIRED FOR FUTURE REFILLS     Cardiovascular:  Beta Blockers Failed - 09/13/2022  7:08 PM      Failed - Valid encounter within last 6 months    Recent Outpatient Visits           1 year ago Constipation, unspecified constipation type   [place] Dennard Schaumann, Cammie Mcgee, MD   1 year ago Constipation, unspecified constipation type   [place] Pickard, Cammie Mcgee, MD   1 year ago Encounter for Medicare annual wellness exam   [place] Susy Frizzle, MD   1 year ago Viral URI   [place] Eulogio Bear, NP   2 years ago Benign essential HTN   [place] Pickard, Cammie Mcgee, MD              Passed - Last BP in normal range    BP Readings from Last 1 Encounters:  09/03/22 120/62         Passed - Last Heart Rate in normal range    Pulse Readings from Last 1 Encounters:  09/03/22 63

## 2022-09-18 ENCOUNTER — Other Ambulatory Visit: Payer: Self-pay | Admitting: Family Medicine

## 2022-09-19 NOTE — Telephone Encounter (Signed)
Requested medication (s) are due for refill today: routing for review  Requested medication (s) are on the active medication list: yes  Last refill:  09/03/22  Future visit scheduled: no  Notes to clinic:  Medication not assigned to a protocol, review manually.      Requested Prescriptions  Pending Prescriptions Disp Refills   amoxicillin (AMOXIL) 875 MG tablet [Pharmacy Med Name: Amoxicillin 875 MG Oral Tablet] 20 tablet 0    Sig: Take 1 tablet by mouth twice daily for 10 days     Off-Protocol Failed - 09/18/2022 10:23 AM      Failed - Medication not assigned to a protocol, review manually.      Failed - Valid encounter within last 12 months    Recent Outpatient Visits           1 year ago Constipation, unspecified constipation type   [place] Pickard, Cammie Mcgee, MD   1 year ago Constipation, unspecified constipation type   [place] Pickard, Cammie Mcgee, MD   1 year ago Encounter for Medicare annual wellness exam   [place] Susy Frizzle, MD   1 year ago Viral URI   [place], NP   2 years ago Benign essential HTN   [place] Pickard, Cammie Mcgee, MD

## 2022-09-26 DIAGNOSIS — M545 Low back pain, unspecified: Secondary | ICD-10-CM | POA: Diagnosis not present

## 2022-10-23 ENCOUNTER — Other Ambulatory Visit: Payer: Self-pay | Admitting: Family Medicine

## 2022-10-23 NOTE — Telephone Encounter (Signed)
Requested Prescriptions  Pending Prescriptions Disp Refills   lisinopril-hydrochlorothiazide (ZESTORETIC) 10-12.5 MG tablet [Pharmacy Med Name: Lisinopril-hydroCHLOROthiazide 10-12.5 MG Oral Tablet] 180 tablet 1    Sig: Take 1 tablet by mouth twice daily     Cardiovascular:  ACEI + Diuretic Combos Failed - 10/23/2022  7:03 AM      Failed - Na in normal range and within 180 days    Sodium  Date Value Ref Range Status  08/02/2022 132 (L) 135 - 146 mmol/L Final         Failed - Valid encounter within last 6 months    Recent Outpatient Visits           1 year ago Constipation, unspecified constipation type   [facility] Donita Brooks, MD   1 year ago Constipation, unspecified constipation type   [facility] Donita Brooks, MD   1 year ago Encounter for Medicare annual wellness exam   [facility] Donita Brooks, MD   1 year ago Viral URI   [facility] Valentino Nose, NP   2 years ago Benign essential HTN   [facility] Tanya Nones, Priscille Heidelberg, MD              Passed - K in normal range and within 180 days    Potassium  Date Value Ref Range Status  08/02/2022 4.3 3.5 - 5.3 mmol/L Final         Passed - Cr in normal range and within 180 days    Creat  Date Value Ref Range Status  08/02/2022 1.08 0.70 - 1.22 mg/dL Final         Passed - eGFR is 30 or above and within 180 days    GFR, Est African American  Date Value Ref Range Status  08/16/2020 77 > OR = 60 mL/min/1.28m2 Final   GFR, Est Non African American  Date Value Ref Range Status  08/16/2020 66 > OR = 60 mL/min/1.67m2 Final   GFR, Estimated  Date Value Ref Range Status  12/20/2021 55 (L) >60 mL/min Final    Comment:    (NOTE) Calculated using the CKD-EPI Creatinine Equation (2021)    GFR  Date Value Ref Range Status  09/13/2021 60.05 >60.00 mL/min Final    Comment:    Calculated using the CKD-EPI Creatinine  Equation (2021)   eGFR  Date Value Ref Range Status  08/02/2022 67 > OR = 60 mL/min/1.44m2 Final         Passed - Patient is not pregnant      Passed - Last BP in normal range    BP Readings from Last 1 Encounters:  09/03/22 120/62

## 2022-12-13 DIAGNOSIS — L570 Actinic keratosis: Secondary | ICD-10-CM | POA: Diagnosis not present

## 2022-12-13 DIAGNOSIS — D485 Neoplasm of uncertain behavior of skin: Secondary | ICD-10-CM | POA: Diagnosis not present

## 2022-12-14 ENCOUNTER — Other Ambulatory Visit: Payer: Self-pay | Admitting: Family Medicine

## 2023-02-26 DIAGNOSIS — L821 Other seborrheic keratosis: Secondary | ICD-10-CM | POA: Diagnosis not present

## 2023-02-26 DIAGNOSIS — B001 Herpesviral vesicular dermatitis: Secondary | ICD-10-CM | POA: Diagnosis not present

## 2023-02-26 DIAGNOSIS — L570 Actinic keratosis: Secondary | ICD-10-CM | POA: Diagnosis not present

## 2023-04-12 ENCOUNTER — Other Ambulatory Visit: Payer: Self-pay | Admitting: Family Medicine

## 2023-04-12 NOTE — Telephone Encounter (Signed)
Requested medication (s) are due for refill today: yes  Requested medication (s) are on the active medication list: yes  Last refill:  10/23/22 #180/1  Future visit scheduled: no  Notes to clinic:  Unable to refill per protocol due to failed labs, no updated results.      Requested Prescriptions  Pending Prescriptions Disp Refills   lisinopril-hydrochlorothiazide (ZESTORETIC) 10-12.5 MG tablet [Pharmacy Med Name: Lisinopril-hydroCHLOROthiazide 10-12.5 MG Oral Tablet] 180 tablet 0    Sig: Take 1 tablet by mouth twice daily     Cardiovascular:  ACEI + Diuretic Combos Failed - 04/12/2023  5:40 PM      Failed - Na in normal range and within 180 days    Sodium  Date Value Ref Range Status  08/02/2022 132 (L) 135 - 146 mmol/L Final         Failed - K in normal range and within 180 days    Potassium  Date Value Ref Range Status  08/02/2022 4.3 3.5 - 5.3 mmol/L Final         Failed - Cr in normal range and within 180 days    Creat  Date Value Ref Range Status  08/02/2022 1.08 0.70 - 1.22 mg/dL Final         Failed - eGFR is 30 or above and within 180 days    GFR, Est African American  Date Value Ref Range Status  08/16/2020 77 > OR = 60 mL/min/1.54m2 Final   GFR, Est Non African American  Date Value Ref Range Status  08/16/2020 66 > OR = 60 mL/min/1.63m2 Final   GFR, Estimated  Date Value Ref Range Status  12/20/2021 55 (L) >60 mL/min Final    Comment:    (NOTE) Calculated using the CKD-EPI Creatinine Equation (2021)    GFR  Date Value Ref Range Status  09/13/2021 60.05 >60.00 mL/min Final    Comment:    Calculated using the CKD-EPI Creatinine Equation (2021)   eGFR  Date Value Ref Range Status  08/02/2022 67 > OR = 60 mL/min/1.85m2 Final         Failed - Valid encounter within last 6 months    Recent Outpatient Visits           1 year ago Constipation, unspecified constipation type   [facility] Donita Brooks, MD   1 year ago  Constipation, unspecified constipation type   [facility] Donita Brooks, MD   1 year ago Encounter for Medicare annual wellness exam   [facility] Donita Brooks, MD   1 year ago Viral URI   [facility] Valentino Nose, NP   2 years ago Benign essential HTN   [facility] Donita Brooks, MD              Passed - Patient is not pregnant      Passed - Last BP in normal range    BP Readings from Last 1 Encounters:  09/03/22 120/62

## 2023-04-18 ENCOUNTER — Telehealth: Payer: Self-pay

## 2023-04-18 ENCOUNTER — Other Ambulatory Visit: Payer: Self-pay

## 2023-04-18 DIAGNOSIS — I10 Essential (primary) hypertension: Secondary | ICD-10-CM

## 2023-04-18 MED ORDER — LISINOPRIL-HYDROCHLOROTHIAZIDE 10-12.5 MG PO TABS: 1.0000 | ORAL_TABLET | Freq: Two times a day (BID) | ORAL | 1 refills | Status: DC

## 2023-04-18 NOTE — Telephone Encounter (Signed)
Pt called in to check on status of this refill lisinopril-hydrochlorothiazide (ZESTORETIC) 10-12.5 MG tablet [[id number]]  LOV: 08/21/22  PHARMACY: [id number] - [place] ([place]), [place] - [address] [place] ([place]) [place] [address] Phone: [phone]  Fax: [phone]  CB#: [phone]

## 2023-04-23 DIAGNOSIS — L538 Other specified erythematous conditions: Secondary | ICD-10-CM | POA: Diagnosis not present

## 2023-04-23 DIAGNOSIS — D225 Melanocytic nevi of trunk: Secondary | ICD-10-CM | POA: Diagnosis not present

## 2023-04-23 DIAGNOSIS — L570 Actinic keratosis: Secondary | ICD-10-CM | POA: Diagnosis not present

## 2023-04-23 DIAGNOSIS — L821 Other seborrheic keratosis: Secondary | ICD-10-CM | POA: Diagnosis not present

## 2023-04-23 DIAGNOSIS — L820 Inflamed seborrheic keratosis: Secondary | ICD-10-CM | POA: Diagnosis not present

## 2023-04-24 ENCOUNTER — Ambulatory Visit: Payer: Medicare Other

## 2023-05-02 DIAGNOSIS — K080 Exfoliation of teeth due to systemic causes: Secondary | ICD-10-CM | POA: Diagnosis not present

## 2023-05-15 DIAGNOSIS — N401 Enlarged prostate with lower urinary tract symptoms: Secondary | ICD-10-CM | POA: Diagnosis not present

## 2023-05-15 DIAGNOSIS — Z85528 Personal history of other malignant neoplasm of kidney: Secondary | ICD-10-CM | POA: Diagnosis not present

## 2023-05-15 DIAGNOSIS — R351 Nocturia: Secondary | ICD-10-CM | POA: Diagnosis not present

## 2023-05-15 DIAGNOSIS — N411 Chronic prostatitis: Secondary | ICD-10-CM | POA: Diagnosis not present

## 2023-05-16 ENCOUNTER — Ambulatory Visit (INDEPENDENT_AMBULATORY_CARE_PROVIDER_SITE_OTHER): Payer: Medicare Other | Admitting: Family Medicine

## 2023-05-16 ENCOUNTER — Encounter: Payer: Self-pay | Admitting: Family Medicine

## 2023-05-16 VITALS — BP 120/70 | HR 100 | Temp 97.8°F | Ht 74.0 in | Wt 208.1 lb

## 2023-05-16 DIAGNOSIS — I10 Essential (primary) hypertension: Secondary | ICD-10-CM | POA: Diagnosis not present

## 2023-05-16 DIAGNOSIS — C649 Malignant neoplasm of unspecified kidney, except renal pelvis: Secondary | ICD-10-CM

## 2023-05-16 MED ORDER — LISINOPRIL-HYDROCHLOROTHIAZIDE 10-12.5 MG PO TABS: 1.0000 | ORAL_TABLET | Freq: Two times a day (BID) | ORAL | 3 refills | Status: DC

## 2023-05-16 MED ORDER — SIMVASTATIN 20 MG PO TABS: 20.0000 mg | ORAL_TABLET | Freq: Every day | ORAL | 3 refills | Status: DC

## 2023-05-16 MED ORDER — METOPROLOL TARTRATE 25 MG PO TABS: 25.0000 mg | ORAL_TABLET | Freq: Two times a day (BID) | ORAL | 3 refills | Status: DC

## 2023-05-16 NOTE — Progress Notes (Signed)
Subjective:    Patient ID: Jesse Larsson., male    DOB: 01-16-1935, 88 y.o.   MRN: [id number] Underwent ablation of the left clear cell renal carcinoma last year.  MRI showed no residual lesion in 12/23.  Patient states that he is scheduled to see his urologist in December and at that time they plan to repeat the MRI to monitor the left kidney.  He denies any chest pain.  He denies any shortness of breath.  He denies any dyspnea on exertion.  His blood pressure today is well-controlled.  He denies any abdominal pain.  He denies any cough or hemoptysis. Past Medical History:  Diagnosis Date   Arthritis    Ascending aorta dilation (HCC)    Blood in stool    Hemorrhoids    History of kidney stones    Hyperlipidemia    Hypertension    Pre-diabetes    Rectal fissure    Rectal pain    Skin cancer    Past Surgical History:  Procedure Laterality Date   HERNIA REPAIR  2000 (approx)   IR RADIOLOGIST EVAL & MGMT  11/09/2021   IR RADIOLOGIST EVAL & MGMT  01/15/2022   lower back surgery  05/1995, 05/1998   RADIOLOGY WITH ANESTHESIA Left 12/20/2021   Procedure: CT WITH ANESTHESIA CRYOABLATION;  Surgeon: Richarda Overlie, MD;  Location: [facility];  Service: Anesthesiology;  Laterality: Left;   SHOULDER SURGERY  1997   both   Current Outpatient Medications on File Prior to Visit  Medication Sig Dispense Refill   aspirin EC 81 MG tablet Take 81 mg by mouth daily. Swallow whole.     lisinopril-hydrochlorothiazide (ZESTORETIC) 10-12.5 MG tablet Take 1 tablet by mouth 2 (two) times daily. 60 tablet 1   metoprolol tartrate (LOPRESSOR) 25 MG tablet Take 1 tablet (25 mg total) by mouth 2 (two) times daily. 180 tablet 1   Multiple Vitamins-Minerals (CENTRUM SILVER ADULT 50+ PO) Take 1 tablet by mouth daily.     neomycin-polymyxin-hydrocortisone (CORTISPORIN) OTIC solution Place 3 drops into the left ear 4 (four) times daily. 10 mL 0   pantoprazole (PROTONIX) 40 MG tablet Take 1 tablet (40 mg total) by mouth  daily. 30 tablet 3   simvastatin (ZOCOR) 20 MG tablet Take 1 tablet (20 mg total) by mouth at bedtime. 90 tablet 1   No current facility-administered medications on file prior to visit.   Allergies  Allergen Reactions   Doxycycline     GI upset    Social History   Socioeconomic History   Marital status: Widowed    Spouse name: Not on file   Number of children: Not on file   Years of education: Not on file   Highest education level: Not on file  Occupational History   Not on file  Tobacco Use   Smoking status: Never   Smokeless tobacco: Never  Vaping Use   Vaping status: Never Used  Substance and Sexual Activity   Alcohol use: No   Drug use: No   Sexual activity: Not on file  Other Topics Concern   Not on file  Social History Narrative   Not on file   Social Determinants of Health   Financial Resource Strain: Low Risk  (08/14/2022)   Overall Financial Resource Strain (CARDIA)    Difficulty of Paying Living Expenses: Not hard at all  Food Insecurity: No Food Insecurity (08/14/2022)   Hunger Vital Sign    Worried About Running Out of Food in  the Last Year: Never true    Ran Out of Food in the Last Year: Never true  Transportation Needs: No Transportation Needs (08/14/2022)   PRAPARE - Administrator, Civil Service (Medical): No    Lack of Transportation (Non-Medical): No  Physical Activity: Sufficiently Active (08/14/2022)   Exercise Vital Sign    Days of Exercise per Week: 5 days    Minutes of Exercise per Session: 60 min  Stress: No Stress Concern Present (08/14/2022)   [employer] of Occupational Health - Occupational Stress Questionnaire    Feeling of Stress : Not at all  Social Connections: Moderately Integrated (08/14/2022)   Social Connection and Isolation Panel [NHANES]    Frequency of Communication with Friends and Family: More than three times a week    Frequency of Social Gatherings with Friends and Family: Three times a week    Attends  Religious Services: More than 4 times per year    Active Member of Clubs or Organizations: Yes    Attends Banker Meetings: More than 4 times per year    Marital Status: Widowed  Intimate Partner Violence: Not At Risk (08/14/2022)   Humiliation, Afraid, Rape, and Kick questionnaire    Fear of Current or Ex-Partner: No    Emotionally Abused: No    Physically Abused: No    Sexually Abused: No      Review of Systems  All other systems reviewed and are negative.      Objective:   Physical Exam Vitals reviewed.  Constitutional:      Appearance: Normal appearance. He is normal weight.  HENT:     Head: Normocephalic and atraumatic.  Cardiovascular:     Rate and Rhythm: Normal rate and regular rhythm.     Heart sounds: Normal heart sounds. No murmur heard. Pulmonary:     Effort: Pulmonary effort is normal. No respiratory distress.     Breath sounds: Normal breath sounds. No wheezing, rhonchi or rales.  Chest:  Breasts:    Right: Normal. No inverted nipple, mass, nipple discharge or skin change.     Left: Normal. No inverted nipple, mass, nipple discharge or skin change.  Abdominal:     General: Bowel sounds are normal. There is no distension.     Palpations: Abdomen is soft. There is no mass.     Tenderness: There is no abdominal tenderness. There is no guarding or rebound.     Hernia: No hernia is present.  Neurological:     General: No focal deficit present.     Mental Status: He is alert. Mental status is at baseline.     Cranial Nerves: No cranial nerve deficit.     Sensory: No sensory deficit.     Motor: No weakness.     Coordination: Coordination normal.     Gait: Gait normal.           Assessment & Plan:  Primary hypertension - Plan: CBC with Differential/Platelet, COMPLETE METABOLIC PANEL WITH GFR, Lipid panel, lisinopril-hydrochlorothiazide (ZESTORETIC) 10-12.5 MG tablet  Renal cell cancer, unspecified laterality (HCC) - Plan: CBC with  Differential/Platelet, COMPLETE METABOLIC PANEL WITH GFR, Lipid panel I am very happy with his blood pressure.  Check CBC CMP lipid panel.  Recommended that we monitor his renal function every 6 months.  No changes in his medications today.  Refill his cholesterol medication.  Ideally I like to see his LDL cholesterol less than 782.

## 2023-05-17 LAB — LIPID PANEL
Cholesterol: 142 mg/dL (ref <200)
HDL: 60 mg/dL (ref >=40)
LDL Cholesterol (Calc): 67 mg/dL
Non-HDL Cholesterol (Calc): 82 mg/dL (ref <130)
Total CHOL/HDL Ratio: 2.4 (calc) (ref <5.0)
Triglycerides: 71 mg/dL (ref <150)

## 2023-05-17 LAB — COMPLETE METABOLIC PANEL WITH GFR
AG Ratio: 1.6 (calc) (ref 1.0–2.5)
ALT: 17 U/L (ref 9–46)
AST: 22 U/L (ref 10–35)
Albumin: 4.1 g/dL (ref 3.6–5.1)
Alkaline phosphatase (APISO): 81 U/L (ref 35–144)
BUN: 14 mg/dL (ref 7–25)
CO2: 29 mmol/L (ref 20–32)
Calcium: 9.5 mg/dL (ref 8.6–10.3)
Chloride: 95 mmol/L — ABNORMAL LOW (ref 98–110)
Creat: 0.97 mg/dL (ref 0.70–1.22)
Globulin: 2.5 g/dL (ref 1.9–3.7)
Glucose, Bld: 118 mg/dL — ABNORMAL HIGH (ref 65–99)
Potassium: 4.5 mmol/L (ref 3.5–5.3)
Sodium: 131 mmol/L — ABNORMAL LOW (ref 135–146)
Total Bilirubin: 0.7 mg/dL (ref 0.2–1.2)
Total Protein: 6.6 g/dL (ref 6.1–8.1)
eGFR: 76 mL/min/{1.73_m2} (ref >=60)

## 2023-05-17 LAB — CBC WITH DIFFERENTIAL/PLATELET
Absolute Lymphocytes: 1568 {cells}/uL (ref 850–3900)
Absolute Monocytes: 555 {cells}/uL (ref 200–950)
Basophils Absolute: 98 {cells}/uL (ref 0–200)
Basophils Relative: 1.6 %
Eosinophils Absolute: 390 {cells}/uL (ref 15–500)
Eosinophils Relative: 6.4 %
HCT: 45.7 % (ref 38.5–50.0)
Hemoglobin: 15.1 g/dL (ref 13.2–17.1)
MCH: 29.2 pg (ref 27.0–33.0)
MCHC: 33 g/dL (ref 32.0–36.0)
MCV: 88.2 fL (ref 80.0–100.0)
MPV: 9.4 fL (ref 7.5–12.5)
Monocytes Relative: 9.1 %
Neutro Abs: 3489 {cells}/uL (ref 1500–7800)
Neutrophils Relative %: 57.2 %
Platelets: 361 10*3/uL (ref 140–400)
RBC: 5.18 10*6/uL (ref 4.20–5.80)
RDW: 11.8 % (ref 11.0–15.0)
Total Lymphocyte: 25.7 %
WBC: 6.1 10*3/uL (ref 3.8–10.8)

## 2023-05-23 ENCOUNTER — Other Ambulatory Visit: Payer: Self-pay | Admitting: Diagnostic Radiology

## 2023-05-23 DIAGNOSIS — N2889 Other specified disorders of kidney and ureter: Secondary | ICD-10-CM

## 2023-05-27 ENCOUNTER — Telehealth: Payer: Self-pay

## 2023-05-27 NOTE — Telephone Encounter (Signed)
Copied from CRM [id number]. Topic: Clinical - Lab/Test Results >> May 27, 2023  4:03 PM Jesse Fletcher wrote: Reason for CRM: Patient request to speak with someone regarding his labs.  Informed pt of labs

## 2023-05-27 NOTE — Telephone Encounter (Signed)
Copied from CRM [id number]. Topic: Clinical - Lab/Test Results >> May 27, 2023  3:57 PM Amy B wrote: Reason for CRM: Patient states he received a message regarding lab results.  He requests a call back to [phone].  Informed pt of labs

## 2023-06-12 ENCOUNTER — Ambulatory Visit (HOSPITAL_COMMUNITY)
Admission: RE | Admit: 2023-06-12 | Discharge: 2023-06-12 | Disposition: A | Discharge status: Home / Self Care | Payer: Medicare Other | Source: Ambulatory Visit | Attending: Diagnostic Radiology | Admitting: Diagnostic Radiology

## 2023-06-12 DIAGNOSIS — N2889 Other specified disorders of kidney and ureter: Secondary | ICD-10-CM | POA: Diagnosis not present

## 2023-06-12 DIAGNOSIS — N280 Ischemia and infarction of kidney: Secondary | ICD-10-CM | POA: Diagnosis not present

## 2023-06-12 DIAGNOSIS — I700 Atherosclerosis of aorta: Secondary | ICD-10-CM | POA: Diagnosis not present

## 2023-06-12 MED ORDER — GADOBUTROL 1 MMOL/ML IV SOLN
9.0000 mL | Freq: Once | INTRAVENOUS | Status: AC | PRN
  Administered 2023-06-12: 9 mL via INTRAVENOUS

## 2023-07-04 ENCOUNTER — Other Ambulatory Visit: Payer: Self-pay | Admitting: Diagnostic Radiology

## 2023-07-04 DIAGNOSIS — N2889 Other specified disorders of kidney and ureter: Secondary | ICD-10-CM

## 2023-07-05 ENCOUNTER — Ambulatory Visit
Admission: RE | Admit: 2023-07-05 | Discharge: 2023-07-05 | Disposition: A | Discharge status: Home / Self Care | Payer: Medicare Other | Source: Ambulatory Visit | Attending: Diagnostic Radiology | Admitting: Diagnostic Radiology

## 2023-07-05 DIAGNOSIS — N2889 Other specified disorders of kidney and ureter: Secondary | ICD-10-CM

## 2023-07-05 NOTE — Progress Notes (Signed)
 Chief Complaint: Patient was consulted remotely today (TeleHealth) for follow-up renal cell carcinoma and ablation.  Referring Physician(s): Watt Rush  History of Present Illness: Jesse Bresee. is a 89 y.o. with a biopsy-proven clear cell renal carcinoma in the left kidney.  The left renal lesion was treated with cryoablation on 12/20/2021. He has no complaints at this time.  He continues to follow-up with Dr. Watt for prostate issues.  He denies dysuria and hematuria.  He had abdominal MRI on 06/12/23.  Past Medical History:  Diagnosis Date   Arthritis    Ascending aorta dilation (HCC)    Blood in stool    Hemorrhoids    History of kidney stones    Hyperlipidemia    Hypertension    Pre-diabetes    Rectal fissure    Rectal pain    Skin cancer     Past Surgical History:  Procedure Laterality Date   HERNIA REPAIR  2000 (approx)   IR RADIOLOGIST EVAL & MGMT  11/09/2021   IR RADIOLOGIST EVAL & MGMT  01/15/2022   lower back surgery  05/1995, 05/1998   RADIOLOGY WITH ANESTHESIA Left 12/20/2021   Procedure: CT WITH ANESTHESIA CRYOABLATION;  Surgeon: Philip Cornet, MD;  Location: [facility];  Service: Anesthesiology;  Laterality: Left;   SHOULDER SURGERY  1997   both    Allergies: Doxycycline   Medications: Prior to Admission medications   Medication Sig Start Date End Date Taking? Authorizing Provider  aspirin EC 81 MG tablet Take 81 mg by mouth daily. Swallow whole.    [provider]  lisinopril -hydrochlorothiazide  (ZESTORETIC ) 10-12.5 MG tablet Take 1 tablet by mouth 2 (two) times daily. 05/16/23   [name], MD  metoprolol  tartrate (LOPRESSOR ) 25 MG tablet Take 1 tablet (25 mg total) by mouth 2 (two) times daily. 05/16/23   [name], MD  Multiple Vitamins-Minerals (CENTRUM SILVER ADULT 50+ PO) Take 1 tablet by mouth daily.    [provider]  neomycin -polymyxin-hydrocortisone (CORTISPORIN) OTIC solution Place 3 drops into the left ear  4 (four) times daily. 08/21/22   [name], MD  pantoprazole  (PROTONIX ) 40 MG tablet Take 1 tablet (40 mg total) by mouth daily. 07/16/22   [name], MD  simvastatin  (ZOCOR ) 20 MG tablet Take 1 tablet (20 mg total) by mouth at bedtime. 05/16/23   [name], MD     No family history on file.  Social History   Socioeconomic History   Marital status: Widowed    Spouse name: Not on file   Number of children: Not on file   Years of education: Not on file   Highest education level: Not on file  Occupational History   Not on file  Tobacco Use   Smoking status: Never   Smokeless tobacco: Never  Vaping Use   Vaping status: Never Used  Substance and Sexual Activity   Alcohol use: No   Drug use: No   Sexual activity: Not on file  Other Topics Concern   Not on file  Social History Narrative   Not on file   Social Drivers of Health   Financial Resource Strain: Low Risk  (08/14/2022)   Overall Financial Resource Strain (CARDIA)    Difficulty of Paying Living Expenses: Not hard at all  Food Insecurity: No Food Insecurity (08/14/2022)   Hunger Vital Sign    Worried About Running Out of Food in the Last Year: Never true    Ran Out of  Food in the Last Year: Never true  Transportation Needs: No Transportation Needs (08/14/2022)   PRAPARE - Administrator, Civil Service (Medical): No    Lack of Transportation (Non-Medical): No  Physical Activity: Sufficiently Active (08/14/2022)   Exercise Vital Sign    Days of Exercise per Week: 5 days    Minutes of Exercise per Session: 60 min  Stress: No Stress Concern Present (08/14/2022)   [employer] of Occupational Health - Occupational Stress Questionnaire    Feeling of Stress : Not at all  Social Connections: Moderately Integrated (08/14/2022)   Social Connection and Isolation Panel [NHANES]    Frequency of Communication with Friends and Family: More than three times a week    Frequency of Social Gatherings  with Friends and Family: Three times a week    Attends Religious Services: More than 4 times per year    Active Member of Clubs or Organizations: Yes    Attends Banker Meetings: More than 4 times per year    Marital Status: Widowed    ECOG Status: 1 - Symptomatic but completely ambulatory  Review of Systems  Constitutional: Negative.   Genitourinary:  Negative for dysuria and hematuria.     Physical Exam No direct physical exam was performed Vital Signs: There were no vitals taken for this visit.  Imaging: MR ABDOMEN WWO CONTRAST Result Date: 06/15/2023 CLINICAL DATA:  Left renal mass, follow-up cryoablation EXAM: MRI ABDOMEN WITHOUT AND WITH CONTRAST TECHNIQUE: Multiplanar multisequence MR imaging of the abdomen was performed both before and after the administration of intravenous contrast. CONTRAST:  9mL GADAVIST  GADOBUTROL  1 MMOL/ML IV SOLN COMPARISON:  06/12/2022 FINDINGS: Lower chest: No acute abnormality. Hepatobiliary: No solid liver abnormality is seen. No gallstones, gallbladder wall thickening, or biliary dilatation. Pancreas: Unremarkable. No pancreatic ductal dilatation or surrounding inflammatory changes. Spleen: Normal in size without significant abnormality. Adrenals/Urinary Tract: Adrenal glands are unremarkable. Unchanged appearance of percutaneous cryoablation of the lateral midportion of the left kidney with associated fat necrosis (series 20, image 20). No residual or recurrent contrast enhancement to suggest local recurrence. Small, benign fluid signal right renal cortical cysts, for which no further follow-up or characterization is required Stomach/Bowel: Stomach is within normal limits. No evidence of bowel wall thickening, distention, or inflammatory changes. Vascular/Lymphatic: Aortic atherosclerosis. No enlarged abdominal lymph nodes. Other: No abdominal wall hernia or abnormality. No ascites. Musculoskeletal: No acute or significant osseous findings.  IMPRESSION: 1. Unchanged appearance of percutaneous cryoablation of the lateral midportion of the left kidney with associated fat necrosis. No residual or recurrent contrast enhancement to suggest local recurrence. 2. No evidence of lymphadenopathy or metastatic disease in the abdomen. Aortic Atherosclerosis (ICD10-I70.0). Electronically Signed   By: Marolyn JONETTA Jaksch M.D.   On: 06/15/2023 07:53    Labs:  CBC: Recent Labs    08/02/22 0824 05/16/23 0915  WBC 7.0 6.1  HGB 15.6 15.1  HCT 45.6 45.7  PLT 338 361    COAGS: No results for input(s): INR, APTT in the last 8760 hours.  BMP: Recent Labs    08/02/22 0824 05/16/23 0915  NA 132* 131*  K 4.3 4.5  CL 96* 95*  CO2 29 29  GLUCOSE 114* 118*  BUN 19 14  CALCIUM 9.6 9.5  CREATININE 1.08 0.97    LIVER FUNCTION TESTS: Recent Labs    08/02/22 0824 05/16/23 0915  BILITOT 0.9 0.7  AST 21 22  ALT 22 17  PROT 6.7 6.6    TUMOR  MARKERS: No results for input(s): AFPTM, CEA, CA199, CHROMGRNA in the last 8760 hours.  Assessment and Plan:  89 year old with history of cryoablation to a left renal cell carcinoma in 2023.  Patient is doing well without complaints.  Follow-up MRI from 06/12/2023 was reviewed.  No evidence for recurrent or residual tumor at ablation site.  Ablated lesion measures approximately 1.8 cm and measured 2.3 cm in 2023.  Discussed MR findings with patient.  Will continue with yearly imaging surveillance.  Plan for follow-up MRI and clinic visit in 1 year.    Electronically Signed: Juliene JONELLE Balder 07/05/2023, 2:50 PM   I spent a total of    5 Minutes in remote  clinical consultation, greater than 50% of which was counseling/coordinating care for renal cell carcinoma.    Visit type: Audio only (telephone). Audio (no video) only due to patient preference. Alternative for in-person consultation at [facility].   Patient ID: Jesse Fletcher., male   DOB: Jun 28, 1935, 89 y.o.   MRN: [id number]

## 2023-07-09 DIAGNOSIS — M25571 Pain in right ankle and joints of right foot: Secondary | ICD-10-CM | POA: Diagnosis not present

## 2023-07-18 ENCOUNTER — Ambulatory Visit: Payer: Medicare Other | Admitting: Podiatry

## 2023-07-23 DIAGNOSIS — M1712 Unilateral primary osteoarthritis, left knee: Secondary | ICD-10-CM | POA: Diagnosis not present

## 2023-07-23 DIAGNOSIS — M25571 Pain in right ankle and joints of right foot: Secondary | ICD-10-CM | POA: Diagnosis not present

## 2023-07-29 NOTE — Progress Notes (Unsigned)
Date:  07/30/2023   ID:  Jesse Fletcher., DOB 31-Oct-1934, MRN [id number]  Patient Location:  [address] [place] [facility] [id number]   Provider location:   [facility], [place] office  PCP:  Donita Brooks, MD  Cardiologist:  [facility]  Chief Complaint  Patient presents with   12 month follow up     "Doing well."     History of Present Illness:    Jesse Nigh. is a 89 y.o. 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  Completed left cryoablation for kidney mass June 2023 Follow-up MRI shows no recurrence Aortic atherosclerosis  EKG personally reviewed by myself on todays visit EKG Interpretation Date/Time:  Tuesday July 30 2023 13:58:38 EST Ventricular Rate:  76 PR Interval:  264 QRS Duration:  94 QT Interval:  366 QTC Calculation: 411 R Axis:   33  Text Interpretation: Sinus rhythm with 1st degree A-V block When compared with ECG of 04-Jun-2017 19:05, Questionable change in QRS axis Nonspecific T wave abnormality has replaced inverted T waves in Inferior leads Nonspecific T wave abnormality no longer evident in Lateral leads Confirmed by Julien Nordmann [phone]) on 07/30/2023 2:09:14 PM   Other past medical history reviewed  Discussed previous CT scan January 2019 Ascending Aorta: Ascending aortic root dilatation 4.0 cm with  atherosclerosis Coronary arteries: 3 vessel coronary artery calcium noted Coronary calcium score of 209 . This was 36 th percentile for age and sex matched control.  Echo stress Treadmill showing low exercise tolerance No wall motion abnormality concerning for ischemia Mildly dilated aortic root 38 mm     Past Medical History:  Diagnosis Date   Arthritis    Ascending aorta dilation (HCC)    Blood in stool    Hemorrhoids    History of kidney stones    Hyperlipidemia    Hypertension    Pre-diabetes    Rectal fissure    Rectal pain    Skin cancer    Past Surgical History:  Procedure Laterality Date   HERNIA REPAIR  2000 (approx)   IR RADIOLOGIST EVAL & MGMT  11/09/2021   IR RADIOLOGIST EVAL & MGMT  01/15/2022   lower back surgery  05/1995, 05/1998   RADIOLOGY WITH ANESTHESIA Left 12/20/2021   Procedure: CT WITH ANESTHESIA CRYOABLATION;  Surgeon: Richarda Overlie, MD;  Location: [facility];  Service: Anesthesiology;  Laterality: Left;   SHOULDER SURGERY  1997   both     Current Meds  Medication Sig   aspirin EC 81 MG tablet Take 81 mg by mouth daily. Swallow whole.   lisinopril-hydrochlorothiazide (ZESTORETIC) 10-12.5 MG tablet Take 1 tablet by mouth 2 (two) times daily.   metoprolol tartrate (LOPRESSOR) 25 MG tablet Take 1 tablet (25 mg total) by mouth 2 (two) times daily.   Multiple Vitamins-Minerals (CENTRUM SILVER  ADULT 50+ PO) Take 1 tablet by mouth daily.   neomycin-polymyxin-hydrocortisone (CORTISPORIN) OTIC solution Place 3 drops into the left ear 4 (four) times daily.   simvastatin (ZOCOR) 20 MG tablet Take 1 tablet (20 mg total) by mouth at bedtime.     Allergies:   Doxycycline   Social History   Tobacco Use   Smoking status: Never   Smokeless tobacco: Never  Vaping Use   Vaping status: Never Used  Substance Use Topics   Alcohol use: No   Drug use: No     Current Outpatient Medications on File Prior to Visit  Medication Sig Dispense Refill   aspirin EC 81 MG  tablet Take 81 mg by mouth daily. Swallow whole.     lisinopril-hydrochlorothiazide (ZESTORETIC) 10-12.5 MG tablet Take 1 tablet by mouth 2 (two) times daily. 180 tablet 3   metoprolol tartrate (LOPRESSOR) 25 MG tablet Take 1 tablet (25 mg total) by mouth 2 (two) times daily. 180 tablet 3   Multiple Vitamins-Minerals (CENTRUM SILVER ADULT 50+ PO) Take 1 tablet by mouth daily.     neomycin-polymyxin-hydrocortisone (CORTISPORIN) OTIC solution Place 3 drops into the left ear 4 (four) times daily. 10 mL 0   simvastatin (ZOCOR) 20 MG tablet Take 1 tablet (20 mg total) by mouth at bedtime. 90 tablet 3   No current facility-administered medications on file prior to visit.     Family Hx: The patient's family history is not on file.  ROS:   Please see the history of present illness.    Review of Systems  Constitutional: Negative.   HENT: Negative.    Respiratory: Negative.    Cardiovascular: Negative.   Gastrointestinal: Negative.   Musculoskeletal: Negative.   Neurological: Negative.   Psychiatric/Behavioral: Negative.    All other systems reviewed and are negative.    Labs/Other Tests and Data Reviewed:    Recent Labs: 05/16/2023: ALT 17; BUN 14; Creat 0.97; Hemoglobin 15.1; Platelets 361; Potassium 4.5; Sodium 131   Recent Lipid Panel Lab Results  Component Value Date/Time   CHOL 142 05/16/2023 09:15 AM   TRIG 71 05/16/2023 09:15 AM   HDL 60 05/16/2023 09:15 AM   CHOLHDL 2.4 05/16/2023 09:15 AM   LDLCALC 67 05/16/2023 09:15 AM    Wt Readings from Last 3 Encounters:  07/30/23 219 lb 2 oz (99.4 kg)  05/16/23 208 lb 2 oz (94.4 kg)  09/03/22 215 lb (97.5 kg)     Exam:    BP 124/66 (BP Location: Left Arm, Patient Position: Sitting, Cuff Size: Normal)   Pulse 76   Ht 6\' 2"  (1.88 m)   Wt 219 lb 2 oz (99.4 kg)   SpO2 95%   BMI 28.13 kg/m  Constitutional:  oriented to person, place, and time. No distress.  HENT:  Head: Grossly normal Eyes:  no discharge. No scleral  icterus.  Neck: No JVD, no carotid bruits  Cardiovascular: Regular rate and rhythm, no murmurs appreciated Pulmonary/Chest: Clear to auscultation bilaterally, no wheezes or rails Abdominal: Soft.  no distension.  no tenderness.  Musculoskeletal: Normal range of motion Neurological:  normal muscle tone. Coordination normal. No atrophy Skin: Skin warm and dry Psychiatric: normal affect, pleasant   ASSESSMENT & PLAN:    Ascending aorta dilation (HCC)  4 cm in January 2019, mildly dilated Blood pressure at goal In follow-up consider repeat imaging Given his age, would likely not be a surgical candidate if needed  Agatston coronary artery calcium score between 200 and 399 Cholesterol at  goal, non-smoker, no significant diabetes No further workup at this time  chest pain, unspecified type Denies chest pain concerning for angina Active, has a walking program  dyspnea on exertion Reports good exercise tolerance, walks several days a week in his neighborhood 45 minutes  Pure hypercholesterolemia Cholesterol is at goal on the current lipid regimen. No changes to the medications were made.  Essential hypertension Blood pressure is well controlled on today's visit. No changes made to the medications.   Signed, Julien Nordmann, MD  07/30/2023 2:09 PM    [facility] [address], [place], [place] [address]

## 2023-07-30 ENCOUNTER — Encounter: Payer: Self-pay | Admitting: Cardiovascular Disease

## 2023-07-30 ENCOUNTER — Ambulatory Visit: Payer: Medicare Other | Attending: Cardiovascular Disease | Admitting: Cardiovascular Disease

## 2023-07-30 VITALS — BP 124/66 | HR 76 | Ht 74.0 in | Wt 219.1 lb

## 2023-07-30 DIAGNOSIS — E7800 Pure hypercholesterolemia, unspecified: Secondary | ICD-10-CM

## 2023-07-30 DIAGNOSIS — R0609 Other forms of dyspnea: Secondary | ICD-10-CM | POA: Diagnosis not present

## 2023-07-30 DIAGNOSIS — I10 Essential (primary) hypertension: Secondary | ICD-10-CM

## 2023-07-30 DIAGNOSIS — R931 Abnormal findings on diagnostic imaging of heart and coronary circulation: Secondary | ICD-10-CM

## 2023-07-30 DIAGNOSIS — R079 Chest pain, unspecified: Secondary | ICD-10-CM

## 2023-07-30 DIAGNOSIS — I77810 Thoracic aortic ectasia: Secondary | ICD-10-CM | POA: Diagnosis not present

## 2023-07-30 NOTE — Patient Instructions (Signed)
Medication Instructions:  No changes  If you need a refill on your cardiac medications before your next appointment, please call your pharmacy.   Lab work: No new labs needed  Testing/Procedures: No new testing needed  Follow-Up: At [facility], you and your health needs are our priority.  As part of our continuing mission to provide you with exceptional heart care, we have created designated Provider Care Teams.  These Care Teams include your primary Cardiologist (physician) and Advanced Practice Providers (APPs -  Physician Assistants and Nurse Practitioners) who all work together to provide you with the care you need, when you need it.  You will need a follow up appointment in 12 months  Providers on your designated Care Team:   Nicolasa Ducking, NP Eula Listen, PA-C Cadence Fransico Michael, [place]  COVID-19 Vaccine Information can be found at: [email or web address] For questions related to vaccine distribution or appointments, please email [email or web address] or call [phone].

## 2023-08-29 DIAGNOSIS — L720 Epidermal cyst: Secondary | ICD-10-CM | POA: Diagnosis not present

## 2023-08-29 DIAGNOSIS — L814 Other melanin hyperpigmentation: Secondary | ICD-10-CM | POA: Diagnosis not present

## 2023-08-29 DIAGNOSIS — L218 Other seborrheic dermatitis: Secondary | ICD-10-CM | POA: Diagnosis not present

## 2023-08-29 DIAGNOSIS — L821 Other seborrheic keratosis: Secondary | ICD-10-CM | POA: Diagnosis not present

## 2023-08-29 DIAGNOSIS — L570 Actinic keratosis: Secondary | ICD-10-CM | POA: Diagnosis not present

## 2023-09-03 ENCOUNTER — Ambulatory Visit (INDEPENDENT_AMBULATORY_CARE_PROVIDER_SITE_OTHER)

## 2023-09-03 ENCOUNTER — Ambulatory Visit (INDEPENDENT_AMBULATORY_CARE_PROVIDER_SITE_OTHER): Payer: Medicare Other | Admitting: Podiatry

## 2023-09-03 ENCOUNTER — Encounter: Payer: Self-pay | Admitting: Podiatry

## 2023-09-03 DIAGNOSIS — M79672 Pain in left foot: Secondary | ICD-10-CM

## 2023-09-03 DIAGNOSIS — M21619 Bunion of unspecified foot: Secondary | ICD-10-CM

## 2023-09-03 DIAGNOSIS — M79675 Pain in left toe(s): Secondary | ICD-10-CM | POA: Diagnosis not present

## 2023-09-03 DIAGNOSIS — M79671 Pain in right foot: Secondary | ICD-10-CM

## 2023-09-03 DIAGNOSIS — B351 Tinea unguium: Secondary | ICD-10-CM

## 2023-09-03 DIAGNOSIS — M79674 Pain in right toe(s): Secondary | ICD-10-CM

## 2023-09-03 DIAGNOSIS — M2011 Hallux valgus (acquired), right foot: Secondary | ICD-10-CM

## 2023-09-03 NOTE — Progress Notes (Signed)
 Subjective:   Patient ID: Jesse Fletcher., male   DOB: 89 y.o.   MRN: [id number]   HPI Chief Complaint  Patient presents with   Foot Pain    RM#14 Bilateral foot pain hurting for a long time but has worsened in the last 3 months also needs nail trim.   89 year old male presents after the above concerns.  States his nails are thickened elongated he cannot trim them himself.  He also previously had pain on the right foot plantar wart on second metatarsal.  He was seen at orthopedics for this and placed into a cam boot.  He states he stay active and walk.  He started wearing orthotics which seems to help.  The pain in the area is not currently present.  No injuries that he reports.  No open lesions.   Review of Systems  All other systems reviewed and are negative.  Past Medical History:  Diagnosis Date   Arthritis    Ascending aorta dilation (HCC)    Blood in stool    Hemorrhoids    History of kidney stones    Hyperlipidemia    Hypertension    Pre-diabetes    Rectal fissure    Rectal pain    Skin cancer     Past Surgical History:  Procedure Laterality Date   HERNIA REPAIR  2000 (approx)   IR RADIOLOGIST EVAL & MGMT  11/09/2021   IR RADIOLOGIST EVAL & MGMT  01/15/2022   lower back surgery  05/1995, 05/1998   RADIOLOGY WITH ANESTHESIA Left 12/20/2021   Procedure: CT WITH ANESTHESIA CRYOABLATION;  Surgeon: Richarda Overlie, MD;  Location: [facility];  Service: Anesthesiology;  Laterality: Left;   SHOULDER SURGERY  1997   both     Current Outpatient Medications:    aspirin EC 81 MG tablet, Take 81 mg by mouth daily. Swallow whole., Disp: , Rfl:    lisinopril-hydrochlorothiazide (ZESTORETIC) 10-12.5 MG tablet, Take 1 tablet by mouth 2 (two) times daily., Disp: 180 tablet, Rfl: 3   metoprolol tartrate (LOPRESSOR) 25 MG tablet, Take 1 tablet (25 mg total) by mouth 2 (two) times daily., Disp: 180 tablet, Rfl: 3   Multiple Vitamins-Minerals (CENTRUM SILVER ADULT 50+ PO), Take 1 tablet by  mouth daily., Disp: , Rfl:    simvastatin (ZOCOR) 20 MG tablet, Take 1 tablet (20 mg total) by mouth at bedtime., Disp: 90 tablet, Rfl: 3   neomycin-polymyxin-hydrocortisone (CORTISPORIN) OTIC solution, Place 3 drops into the left ear 4 (four) times daily. (Patient not taking: Reported on 09/03/2023), Disp: 10 mL, Rfl: 0  Allergies  Allergen Reactions   Doxycycline     GI upset           Objective:  Physical Exam  General: AAO x3, NAD  Dermatological: Nails are hypertrophic, dystrophic, brittle, discolored, elongated 10. No surrounding redness or drainage. Tenderness nails 1-5 bilaterally. No open lesions or pre-ulcerative lesions are identified today.  Hyperkeratotic lesions submetatarsal 5 as well as medial hallux without any underlying ulceration, drainage or signs of infection.  Vascular: Dorsalis Pedis artery and Posterior Tibial artery pedal pulses are 2/4 bilateral with immedate capillary fill time.  There is no pain with calf compression, swelling, warmth, erythema.   Neruologic: Grossly intact via light touch bilateral.   Musculoskeletal: I am not currently able to appreciate any area pinpoint tenderness particular in the right second metatarsal head distally.  There are significant bunions present bilaterally.  There is no pain with MPJ range of motion.  Ankle, subtalar range of motion intact.  MMT 5/5. Marland Kitchen  Gait: Unassisted, Nonantalgic.       Assessment:   89 year old male with symptomatic onychomycosis, bunion deformity with history of right foot pain     Plan:  -Treatment options discussed including all alternatives, risks, and complications -Etiology of symptoms were discussed -X-rays were obtained reviewed bilaterally.  3 views of bilateral feet were obtained.  Significant bunion is present.  No evidence of acute fracture. -Right foot pain has resolved.  Would recommend continue shoes, good arch support, orthotics he currently is wearing particular when he walks to  help decrease pressure.  Monitoring reoccurrence and should this recur to let me know. -Sharply debrided nails x 10 without any complications or bleeding.  I have ordered a compound medication through [facility] to help with this.  -Serpe debrided hyperkeratotic lesions x 2 without any complications or bleeding as a courtesy as they are quite minimal.    No follow-ups on file.  Vivi Barrack DPM

## 2023-09-19 ENCOUNTER — Telehealth: Payer: Self-pay | Admitting: Podiatry

## 2023-09-19 NOTE — Telephone Encounter (Signed)
 Patient called and said that he was supposed to get a prescription medication for his toes, and he has yet to receive the medication. He didn't remember the name of the medicine, but he mentioned it being a cream or a gel. Thank you.

## 2023-09-20 NOTE — Telephone Encounter (Signed)
 Called and left patient a voicemail that the prescription was sent into the pharmacy.

## 2023-10-10 ENCOUNTER — Ambulatory Visit: Payer: Medicare Other | Admitting: *Deleted

## 2023-10-10 DIAGNOSIS — Z0000 Encounter for general adult medical examination without abnormal findings: Secondary | ICD-10-CM

## 2023-10-10 NOTE — Patient Instructions (Signed)
 [name] , Thank you for taking time to come for your Medicare Wellness Visit. I appreciate your ongoing commitment to your health goals. Please review the following plan we discussed and let me know if I can assist you in the future.   Screening recommendations/referrals:  Recommended yearly ophthalmology/optometry visit for glaucoma screening and checkup Recommended yearly dental visit for hygiene and checkup  Vaccinations: Influenza vaccine:  Pneumococcal vaccine:  Tdap vaccine:  Shingles vaccine:    Preventive Care 65 Years and Older, Male Preventive care refers to lifestyle choices and visits with your health care provider that can promote health and wellness. What does preventive care include? A yearly physical exam. This is also called an annual well check. Dental exams once or twice a year. Routine eye exams. Ask your health care provider how often you should have your eyes checked. Personal lifestyle choices, including: Daily care of your teeth and gums. Regular physical activity. Eating a healthy diet. Avoiding tobacco and drug use. Limiting alcohol use. Practicing safe sex. Taking low doses of aspirin every day. Taking vitamin and mineral supplements as recommended by your health care provider. What happens during an annual well check? The services and screenings done by your health care provider during your annual well check will depend on your age, overall health, lifestyle risk factors, and family history of disease. Counseling  Your health care provider may ask you questions about your: Alcohol use. Tobacco use. Drug use. Emotional well-being. Home and relationship well-being. Sexual activity. Eating habits. History of falls. Memory and ability to understand (cognition). Work and work Astronomer. Screening  You may have the following tests or measurements: Height, weight, and BMI. Blood pressure. Lipid and cholesterol levels. These may be checked every  5 years, or more frequently if you are over 92 years old. Skin check. Lung cancer screening. You may have this screening every year starting at age 58 if you have a 30-pack-year history of smoking and currently smoke or have quit within the past 15 years. Fecal occult blood test (FOBT) of the stool. You may have this test every year starting at age 68. Flexible sigmoidoscopy or colonoscopy. You may have a sigmoidoscopy every 5 years or a colonoscopy every 10 years starting at age 1. Prostate cancer screening. Recommendations will vary depending on your family history and other risks. Hepatitis C blood test. Hepatitis B blood test. Sexually transmitted disease (STD) testing. Diabetes screening. This is done by checking your blood sugar (glucose) after you have not eaten for a while (fasting). You may have this done every 1-3 years. Abdominal aortic aneurysm (AAA) screening. You may need this if you are a current or former smoker. Osteoporosis. You may be screened starting at age 68 if you are at high risk. Talk with your health care provider about your test results, treatment options, and if necessary, the need for more tests. Vaccines  Your health care provider may recommend certain vaccines, such as: Influenza vaccine. This is recommended every year. Tetanus, diphtheria, and acellular pertussis (Tdap, Td) vaccine. You may need a Td booster every 10 years. Zoster vaccine. You may need this after age 75. Pneumococcal 13-valent conjugate (PCV13) vaccine. One dose is recommended after age 33. Pneumococcal polysaccharide (PPSV23) vaccine. One dose is recommended after age 28. Talk to your health care provider about which screenings and vaccines you need and how often you need them. This information is not intended to replace advice given to you by your health care provider. Make sure you  discuss any questions you have with your health care provider. Document Released: 07/15/2015 Document Revised:  03/07/2016 Document Reviewed: 04/19/2015 Elsevier Interactive Patient Education  2017 [employer].  Fall Prevention in the Home Falls can cause injuries. They can happen to people of all ages. There are many things you can do to make your home safe and to help prevent falls. What can I do on the outside of my home? Regularly fix the edges of walkways and driveways and fix any cracks. Remove anything that might make you trip as you walk through a door, such as a raised step or threshold. Trim any bushes or trees on the path to your home. Use bright outdoor lighting. Clear any walking paths of anything that might make someone trip, such as rocks or tools. Regularly check to see if handrails are loose or broken. Make sure that both sides of any steps have handrails. Any raised decks and porches should have guardrails on the edges. Have any leaves, snow, or ice cleared regularly. Use sand or salt on walking paths during winter. Clean up any spills in your garage right away. This includes oil or grease spills. What can I do in the bathroom? Use night lights. Install grab bars by the toilet and in the tub and shower. Do not use towel bars as grab bars. Use non-skid mats or decals in the tub or shower. If you need to sit down in the shower, use a plastic, non-slip stool. Keep the floor dry. Clean up any water that spills on the floor as soon as it happens. Remove soap buildup in the tub or shower regularly. Attach bath mats securely with double-sided non-slip rug tape. Do not have throw rugs and other things on the floor that can make you trip. What can I do in the bedroom? Use night lights. Make sure that you have a light by your bed that is easy to reach. Do not use any sheets or blankets that are too big for your bed. They should not hang down onto the floor. Have a firm chair that has side arms. You can use this for support while you get dressed. Do not have throw rugs and other things  on the floor that can make you trip. What can I do in the kitchen? Clean up any spills right away. Avoid walking on wet floors. Keep items that you use a lot in easy-to-reach places. If you need to reach something above you, use a strong step stool that has a grab bar. Keep electrical cords out of the way. Do not use floor polish or wax that makes floors slippery. If you must use wax, use non-skid floor wax. Do not have throw rugs and other things on the floor that can make you trip. What can I do with my stairs? Do not leave any items on the stairs. Make sure that there are handrails on both sides of the stairs and use them. Fix handrails that are broken or loose. Make sure that handrails are as long as the stairways. Check any carpeting to make sure that it is firmly attached to the stairs. Fix any carpet that is loose or worn. Avoid having throw rugs at the top or bottom of the stairs. If you do have throw rugs, attach them to the floor with carpet tape. Make sure that you have a light switch at the top of the stairs and the bottom of the stairs. If you do not have them, ask someone to  add them for you. What else can I do to help prevent falls? Wear shoes that: Do not have high heels. Have rubber bottoms. Are comfortable and fit you well. Are closed at the toe. Do not wear sandals. If you use a stepladder: Make sure that it is fully opened. Do not climb a closed stepladder. Make sure that both sides of the stepladder are locked into place. Ask someone to hold it for you, if possible. Clearly mark and make sure that you can see: Any grab bars or handrails. First and last steps. Where the edge of each step is. Use tools that help you move around (mobility aids) if they are needed. These include: Canes. Walkers. Scooters. Crutches. Turn on the lights when you go into a dark area. Replace any light bulbs as soon as they burn out. Set up your furniture so you have a clear path. Avoid  moving your furniture around. If any of your floors are uneven, fix them. If there are any pets around you, be aware of where they are. Review your medicines with your doctor. Some medicines can make you feel dizzy. This can increase your chance of falling. Ask your doctor what other things that you can do to help prevent falls. This information is not intended to replace advice given to you by your health care provider. Make sure you discuss any questions you have with your health care provider. Document Released: 04/14/2009 Document Revised: 11/24/2015 Document Reviewed: 07/23/2014 Elsevier Interactive Patient Education  2017 [employer].

## 2023-10-10 NOTE — Progress Notes (Signed)
 Subjective:   Jesse Fletcher. is a 89 y.o. male who presents for Medicare Annual/Subsequent preventive examination.  Visit Complete: Virtual I connected with  Jesse Fletcher. on 10/10/23 by a audio enabled telemedicine application and verified that I am speaking with the correct person using two identifiers.  Patient Location: Home  Provider Location: Home Office  I discussed the limitations of evaluation and management by telemedicine. The patient expressed understanding and agreed to proceed.  Vital Signs: Because this visit was a virtual/telehealth visit, some criteria may be missing or patient reported. Any vitals not documented were not able to be obtained and vitals that have been documented are patient reported.   Cardiac Risk Factors include: advanced age (>73men, >71 women);male gender     Objective:    There were no vitals filed for this visit. There is no height or weight on file to calculate BMI.     10/10/2023    4:19 PM 08/14/2022    2:49 PM 12/20/2021    7:55 AM 12/07/2021   11:10 AM 04/21/2021   11:17 AM 03/10/2020   10:33 AM  Advanced Directives  Does Patient Have a Medical Advance Directive? Yes Yes No No No Yes  Type of Diplomatic Services operational officer Living will;Healthcare Power of [employer] Power of [place];Living will  Does patient want to make changes to medical advance directive?  No - Patient declined      Copy of Healthcare Power of Attorney in Chart?  No - copy requested      Would patient like information on creating a medical advance directive?    No - Patient declined No - Patient declined     Current Medications (verified) Outpatient Encounter Medications as of 10/10/2023  Medication Sig   aspirin EC 81 MG tablet Take 81 mg by mouth daily. Swallow whole.   lisinopril-hydrochlorothiazide (ZESTORETIC) 10-12.5 MG tablet Take 1 tablet by mouth 2 (two) times daily.   metoprolol tartrate (LOPRESSOR) 25 MG tablet Take 1  tablet (25 mg total) by mouth 2 (two) times daily.   Multiple Vitamins-Minerals (CENTRUM SILVER ADULT 50+ PO) Take 1 tablet by mouth daily.   neomycin-polymyxin-hydrocortisone (CORTISPORIN) OTIC solution Place 3 drops into the left ear 4 (four) times daily. (Patient not taking: Reported on 09/03/2023)   simvastatin (ZOCOR) 20 MG tablet Take 1 tablet (20 mg total) by mouth at bedtime.   No facility-administered encounter medications on file as of 10/10/2023.    Allergies (verified) Doxycycline   History: Past Medical History:  Diagnosis Date   Arthritis    Ascending aorta dilation (HCC)    Blood in stool    Hemorrhoids    History of kidney stones    Hyperlipidemia    Hypertension    Pre-diabetes    Rectal fissure    Rectal pain    Skin cancer    Past Surgical History:  Procedure Laterality Date   HERNIA REPAIR  2000 (approx)   IR RADIOLOGIST EVAL & MGMT  11/09/2021   IR RADIOLOGIST EVAL & MGMT  01/15/2022   lower back surgery  05/1995, 05/1998   RADIOLOGY WITH ANESTHESIA Left 12/20/2021   Procedure: CT WITH ANESTHESIA CRYOABLATION;  Surgeon: Richarda Overlie, MD;  Location: [facility];  Service: Anesthesiology;  Laterality: Left;   SHOULDER SURGERY  1997   both   No family history on file. Social History   Socioeconomic History   Marital status: Widowed    Spouse name:  Not on file   Number of children: Not on file   Years of education: Not on file   Highest education level: Not on file  Occupational History   Not on file  Tobacco Use   Smoking status: Never   Smokeless tobacco: Never  Vaping Use   Vaping status: Never Used  Substance and Sexual Activity   Alcohol use: No   Drug use: No   Sexual activity: Not on file  Other Topics Concern   Not on file  Social History Narrative   Not on file   Social Drivers of Health   Financial Resource Strain: Low Risk  (10/10/2023)   Overall Financial Resource Strain (CARDIA)    Difficulty of Paying Living Expenses: Not hard at all   Food Insecurity: No Food Insecurity (10/10/2023)   Hunger Vital Sign    Worried About Running Out of Food in the Last Year: Never true    Ran Out of Food in the Last Year: Never true  Transportation Needs: No Transportation Needs (10/10/2023)   PRAPARE - Administrator, Civil Service (Medical): No    Lack of Transportation (Non-Medical): No  Physical Activity: Sufficiently Active (10/10/2023)   Exercise Vital Sign    Days of Exercise per Week: 5 days    Minutes of Exercise per Session: 40 min  Stress: No Stress Concern Present (10/10/2023)   [employer] of Occupational Health - Occupational Stress Questionnaire    Feeling of Stress : Not at all  Social Connections: Moderately Integrated (10/10/2023)   Social Connection and Isolation Panel [NHANES]    Frequency of Communication with Friends and Family: Twice a week    Frequency of Social Gatherings with Friends and Family: More than three times a week    Attends Religious Services: More than 4 times per year    Active Member of [employer] or Organizations: No    Attends Engineer, structural: More than 4 times per year    Marital Status: Divorced    Tobacco Counseling Counseling given: Not Answered   Clinical Intake:  Pre-visit preparation completed: Yes  Pain : No/denies pain     Diabetes: No  How often do you need to have someone help you when you read instructions, pamphlets, or other written materials from your doctor or pharmacy?: 1 - Never  Interpreter Needed?: No  Information entered by :: Remi Haggard LPN   Activities of Daily Living    10/10/2023    4:11 PM  In your present state of health, do you have any difficulty performing the following activities:  Hearing? 1  Vision? 0  Difficulty concentrating or making decisions? 1  Walking or climbing stairs? 1  Dressing or bathing? 0  Doing errands, shopping? 0  Preparing Food and eating ? N  Using the Toilet? N  In the past six months, have  you accidently leaked urine? N  Do you have problems with loss of bowel control? N  Managing your Medications? N  Managing your Finances? N  Housekeeping or managing your Housekeeping? N    Patient Care Team: Donita Brooks, MD as PCP - General (Family Medicine) Antonieta Iba, MD as PCP - Cardiology (Cardiology) Bjorn Pippin, MD as Attending Physician (Urology)  Indicate any recent Medical Services you may have received from other than [facility] providers in the past year (date may be approximate).     Assessment:   This is a routine wellness examination for Jesse Fletcher.  Hearing/Vision screen  Hearing Screening - Comments:: Bilateral hearing aids Vision Screening - Comments:: Not up to date   Goals Addressed   None    Depression Screen    10/10/2023    4:12 PM 05/16/2023    9:01 AM 08/21/2022   11:02 AM 08/14/2022    2:48 PM 08/09/2022    4:26 PM 04/30/2022    3:29 PM 08/10/2021   10:27 AM  PHQ 2/9 Scores  PHQ - 2 Score 1 0 0 0 0 0 0  PHQ- 9 Score 5      0    Fall Risk    10/10/2023    4:07 PM 05/16/2023    9:01 AM 08/21/2022   11:02 AM 08/14/2022    2:47 PM 08/09/2022    4:26 PM  Fall Risk   Falls in the past year? 1 1 0 0 1  Number falls in past yr: 0 1 0 0 0  Injury with Fall? 0 0 0 0 0  Risk for fall due to :   No Fall Risks No Fall Risks History of fall(s);Impaired balance/gait  Follow up Falls evaluation completed;Education provided;Falls prevention discussed  Falls prevention discussed Falls prevention discussed;Education provided;Falls evaluation completed Education provided;Falls prevention discussed    MEDICARE RISK AT HOME: Medicare Risk at Home Any stairs in or around the home?: No If so, are there any without handrails?: No Home free of loose throw rugs in walkways, pet beds, electrical cords, etc?: Yes Adequate lighting in your home to reduce risk of falls?: Yes Life alert?: No Use of a cane, walker or w/c?: No Grab bars in the bathroom?: Yes Shower  chair or bench in shower?: No Elevated toilet seat or a handicapped toilet?: No  TIMED UP AND GO:  Was the test performed?  No    Cognitive Function:        10/10/2023    4:09 PM 08/14/2022    2:50 PM 04/21/2021   11:20 AM 03/10/2020   10:35 AM  6CIT Screen  What Year? 4 points 0 points  0 points  What month? 0 points 0 points  0 points  What time? 0 points 0 points 0 points 0 points  Count back from 20 0 points 0 points 0 points 0 points  Months in reverse  0 points 0 points 0 points  Repeat phrase 0 points 0 points 2 points 0 points  Total Score  0 points  0 points    Immunizations Immunization History  Administered Date(s) Administered   Fluad Quad(high Dose 65+) 03/06/2019, 03/10/2020, 03/28/2021   Influenza, High Dose Seasonal PF 04/19/2017   Influenza,inj,Quad PF,6+ Mos 04/08/2013, 03/29/2014, 03/25/2015, 04/13/2016, 05/01/2018   Influenza-Unspecified 04/06/2022, 04/15/2023   PFIZER(Purple Top)SARS-COV-2 Vaccination 07/21/2019, 08/10/2019   Pneumococcal Conjugate-13 10/30/2013   Pneumococcal Polysaccharide-23 11/10/2010   Tdap 01/12/2022    TDAP status: Up to date  Flu Vaccine status: Up to date  Pneumococcal vaccine status: Up to date  Covid-19 vaccine status: Information provided on how to obtain vaccines.   Qualifies for Shingles Vaccine? Yes   Zostavax completed No   Shingrix Completed?: No.    Education has been provided regarding the importance of this vaccine. Patient has been advised to call insurance company to determine out of pocket expense if they have not yet received this vaccine. Advised may also receive vaccine at local pharmacy or Health Dept. Verbalized acceptance and understanding.  Screening Tests Health Maintenance  Topic Date Due   Zoster Vaccines- Shingrix (1 of 2)  Never done   COVID-19 Vaccine (3 - Pfizer risk series) 09/07/2019   INFLUENZA VACCINE  01/31/2024   Medicare Annual Wellness (AWV)  10/09/2024   DTaP/Tdap/Td (2 - Td or  Tdap) 01/13/2032   Pneumonia Vaccine 58+ Years old  Completed   HPV VACCINES  Aged Out   Meningococcal B Vaccine  Aged Out    Health Maintenance  Health Maintenance Due  Topic Date Due   Zoster Vaccines- Shingrix (1 of 2) Never done   COVID-19 Vaccine (3 - Pfizer risk series) 09/07/2019    Colorectal cancer screening: No longer required.   Lung Cancer Screening: (Low Dose CT Chest recommended if Age 79-80 years, 20 pack-year currently smoking OR have quit w/in 15years.) does not qualify.   Lung Cancer Screening Referral:   Additional Screening:  Hepatitis C Screening: does not qualify; 2  Vision Screening: Recommended annual ophthalmology exams for early detection of glaucoma and other disorders of the eye. Is the patient up to date with their annual eye exam?  no Who is the provider or what is the name of the office in which the patient attends annual eye exams?  If pt is not established with a provider, would they like to be referred to a provider to establish care? No .   Dental Screening: Recommended annual dental exams for proper oral hygiene    Community Resource Referral / Chronic Care Management: CRR required this visit?  No   CCM required this visit?  No     Plan:     I have personally reviewed and noted the following in the patient's chart:   Medical and social history Use of alcohol, tobacco or illicit drugs  Current medications and supplements including opioid prescriptions. Patient is not currently taking opioid prescriptions. Functional ability and status Nutritional status Physical activity Advanced directives List of other physicians Hospitalizations, surgeries, and ER visits in previous 12 months Vitals Screenings to include cognitive, depression, and falls Referrals and appointments  In addition, I have reviewed and discussed with patient certain preventive protocols, quality metrics, and best practice recommendations. A written personalized  care plan for preventive services as well as general preventive health recommendations were provided to patient.     Remi Haggard, LPN   1/61/0960   After Visit Summary: (MyChart) Due to this being a telephonic visit, the after visit summary with patients personalized plan was offered to patient via MyChart   Nurse Notes:

## 2023-10-31 DIAGNOSIS — K080 Exfoliation of teeth due to systemic causes: Secondary | ICD-10-CM | POA: Diagnosis not present

## 2023-11-05 ENCOUNTER — Encounter: Payer: Self-pay | Admitting: Podiatry

## 2023-11-05 ENCOUNTER — Ambulatory Visit: Admitting: Podiatry

## 2023-11-05 DIAGNOSIS — B351 Tinea unguium: Secondary | ICD-10-CM

## 2023-11-05 DIAGNOSIS — M21619 Bunion of unspecified foot: Secondary | ICD-10-CM | POA: Diagnosis not present

## 2023-11-05 NOTE — Patient Instructions (Signed)
   Choose a moisturizer from  the list below:  For normal skin: Moisturize feet once daily; do not apply between toes A.  CeraVe Daily Moisturizing Lotion B.  Lubriderm Advanced Therapy Lotion or Lubriderm Intense Skin Repair Lotion C.  Aquaphor Intensive Repair Lotion D.  Gold Bond Ultimate Diabetic Foot Lotion E.  Eucerin Intensive Repair Moisturizing Lotion  For extremely dry, cracked feet: moisturize feet once daily; do not apply between toes A. CeraVe Healing Ointment B. Eucerin Aquaphor Repairing Ointment (may be labeled Aquaphor Healing Ointment) C. Vaseline Petroleum Healing Jelly   If you have problems reaching your feet: apply to feet once daily; do not apply between toes A.  Eucerin Aquaphor Ointment Body Spray  B.  Vaseline Intensive Care Spray Moisturizer (Unscented,  Cocoa Radiant Spray or Aloe Smooth Spray)

## 2023-11-06 NOTE — Progress Notes (Signed)
 Subjective:   Patient ID: Lockie Rima., male   DOB: 89 y.o.   MRN: [id number]   HPI Chief Complaint  Patient presents with   Foot Pain    RM#13 Follow up foot pain patient states has purchased new shoes as directed doing well.    89 year old male presents after the above concerns.  States that overall he is doing well.  He did purchase new shoes and his feet been feeling better.  Does not report any open lesions.  He is also using the compound cream through [employer].  No other concerns.   Review of Systems  All other systems reviewed and are negative.        Objective:  Physical Exam  General: AAO x3, NAD  Dermatological: Plus continue mildly hypertrophic, dystrophic.  They are not significantly elongated today.  No open lesions identified bilaterally.    Vascular: Dorsalis Pedis artery and Posterior Tibial artery pedal pulses are 2/4 bilateral with immedate capillary fill time.  There is no pain with calf compression, swelling, warmth, erythema.   Neruologic: Grossly intact via light touch bilateral.   Musculoskeletal: I am not currently able to appreciate any area pinpoint tenderness today.  Bunions are present.  Occasionally will get some discomfort the dorsal aspect of the feet bilaterally but are not able to appreciate any tenderness today.  No edema, erythema.  Flexor, extensor tendons appear to be intact.  Gait: Unassisted, Nonantalgic.       Assessment:   Bunion deformity with improved foot pain since changing shoes    Plan:  -Treatment options discussed including all alternatives, risks, and complications -Etiology of symptoms were discussed -Overall doing better.  Continue shoes, good support.  Discussed walking to try to help with circulation.  Moisturizer for skin and continue with the [facility] ointment for the nails.  As a courtesy I debrided the nails with any complications or bleeding as they are quite minimal.  Return in about  3 months (around 02/05/2024), or if symptoms worsen or fail to improve, for nail fungus, calluses.  Charity Conch DPM

## 2024-01-28 DIAGNOSIS — C44329 Squamous cell carcinoma of skin of other parts of face: Secondary | ICD-10-CM | POA: Diagnosis not present

## 2024-01-28 DIAGNOSIS — D485 Neoplasm of uncertain behavior of skin: Secondary | ICD-10-CM | POA: Diagnosis not present

## 2024-01-28 DIAGNOSIS — L578 Other skin changes due to chronic exposure to nonionizing radiation: Secondary | ICD-10-CM | POA: Diagnosis not present

## 2024-01-28 DIAGNOSIS — L570 Actinic keratosis: Secondary | ICD-10-CM | POA: Diagnosis not present

## 2024-02-26 DIAGNOSIS — Z08 Encounter for follow-up examination after completed treatment for malignant neoplasm: Secondary | ICD-10-CM | POA: Diagnosis not present

## 2024-02-26 DIAGNOSIS — Z85828 Personal history of other malignant neoplasm of skin: Secondary | ICD-10-CM | POA: Diagnosis not present

## 2024-02-26 DIAGNOSIS — L578 Other skin changes due to chronic exposure to nonionizing radiation: Secondary | ICD-10-CM | POA: Diagnosis not present

## 2024-02-26 DIAGNOSIS — L570 Actinic keratosis: Secondary | ICD-10-CM | POA: Diagnosis not present

## 2024-02-26 DIAGNOSIS — C44329 Squamous cell carcinoma of skin of other parts of face: Secondary | ICD-10-CM | POA: Diagnosis not present

## 2024-04-03 ENCOUNTER — Other Ambulatory Visit: Payer: Self-pay | Admitting: Family Medicine

## 2024-04-06 ENCOUNTER — Other Ambulatory Visit: Payer: Self-pay | Admitting: Family Medicine

## 2024-04-06 NOTE — Telephone Encounter (Signed)
 Requested Prescriptions  Pending Prescriptions Disp Refills   simvastatin  (ZOCOR ) 20 MG tablet [Pharmacy Med Name: Simvastatin  20 MG Oral Tablet] 90 tablet 0    Sig: TAKE 1 TABLET BY MOUTH AT BEDTIME     Cardiovascular:  Antilipid - Statins Failed - 04/06/2024 10:50 AM      Failed - Lipid Panel in normal range within the last 12 months    Cholesterol  Date Value Ref Range Status  05/16/2023 142 <200 mg/dL Final   LDL Cholesterol (Calc)  Date Value Ref Range Status  05/16/2023 67 mg/dL (calc) Final    Comment:    Reference range: <100 . Desirable range <100 mg/dL for primary prevention;   <70 mg/dL for patients with CHD or diabetic patients  with > or = 2 CHD risk factors. SABRA LDL-C is now calculated using the Martin-Hopkins  calculation, which is a validated novel method providing  better accuracy than the Friedewald equation in the  estimation of LDL-C.  Gladis APPLETHWAITE et al. SANDREA. 7986;689(80): 2061-2068  (http://education.[facility].com/faq/FAQ164)    HDL  Date Value Ref Range Status  05/16/2023 60 > OR = 40 mg/dL Final   Triglycerides  Date Value Ref Range Status  05/16/2023 71 <150 mg/dL Final         Passed - Patient is not pregnant      Passed - Valid encounter within last 12 months    Recent Outpatient Visits           10 months ago Primary hypertension   [place] [facility] Pickard, Butler DASEN, MD   1 year ago Acute rhinosinusitis   [place] [facility] Duanne, Butler DASEN, MD   1 year ago Foreign body in auricle of left ear, initial encounter   [place] [facility] [name], MD   1 year ago Epigastric pain   [place] [facility] [name], MD   1 year ago Epigastric pain    [facility] Pickard, Butler DASEN, MD

## 2024-04-22 DIAGNOSIS — M25552 Pain in left hip: Secondary | ICD-10-CM | POA: Diagnosis not present

## 2024-04-22 DIAGNOSIS — M5416 Radiculopathy, lumbar region: Secondary | ICD-10-CM | POA: Diagnosis not present

## 2024-04-29 DIAGNOSIS — D225 Melanocytic nevi of trunk: Secondary | ICD-10-CM | POA: Diagnosis not present

## 2024-04-29 DIAGNOSIS — L570 Actinic keratosis: Secondary | ICD-10-CM | POA: Diagnosis not present

## 2024-04-29 DIAGNOSIS — L578 Other skin changes due to chronic exposure to nonionizing radiation: Secondary | ICD-10-CM | POA: Diagnosis not present

## 2024-04-29 DIAGNOSIS — L821 Other seborrheic keratosis: Secondary | ICD-10-CM | POA: Diagnosis not present

## 2024-04-29 DIAGNOSIS — L814 Other melanin hyperpigmentation: Secondary | ICD-10-CM | POA: Diagnosis not present

## 2024-05-06 DIAGNOSIS — M5416 Radiculopathy, lumbar region: Secondary | ICD-10-CM | POA: Diagnosis not present

## 2024-05-07 DIAGNOSIS — K080 Exfoliation of teeth due to systemic causes: Secondary | ICD-10-CM | POA: Diagnosis not present

## 2024-05-10 ENCOUNTER — Other Ambulatory Visit: Payer: Self-pay | Admitting: Family Medicine

## 2024-05-10 DIAGNOSIS — I10 Essential (primary) hypertension: Secondary | ICD-10-CM

## 2024-05-12 NOTE — Telephone Encounter (Signed)
 Requested medications are due for refill today.  yes  Requested medications are on the active medications list.  yes  Last refill. 05/16/2023 #180 0 rf  Future visit scheduled.   Next year  Notes to clinic.  Labs are expired.    Requested Prescriptions  Pending Prescriptions Disp Refills   lisinopril -hydrochlorothiazide  (ZESTORETIC ) 10-12.5 MG tablet [Pharmacy Med Name: Lisinopril -hydroCHLOROthiazide  10-12.5 MG Oral Tablet] 180 tablet 0    Sig: Take 1 tablet by mouth twice daily     Cardiovascular:  ACEI + Diuretic Combos Failed - 05/12/2024 11:46 AM      Failed - Na in normal range and within 180 days    Sodium  Date Value Ref Range Status  05/16/2023 131 (L) 135 - 146 mmol/L Final         Failed - K in normal range and within 180 days    Potassium  Date Value Ref Range Status  05/16/2023 4.5 3.5 - 5.3 mmol/L Final         Failed - Cr in normal range and within 180 days    Creat  Date Value Ref Range Status  05/16/2023 0.97 0.70 - 1.22 mg/dL Final         Failed - eGFR is 30 or above and within 180 days    GFR, Est African American  Date Value Ref Range Status  08/16/2020 77 > OR = 60 mL/min/1.4m2 Final   GFR, Est Non African American  Date Value Ref Range Status  08/16/2020 66 > OR = 60 mL/min/1.65m2 Final   GFR, Estimated  Date Value Ref Range Status  12/20/2021 55 (L) >60 mL/min Final    Comment:    (NOTE) Calculated using the CKD-EPI Creatinine Equation (2021)    GFR  Date Value Ref Range Status  09/13/2021 60.05 >60.00 mL/min Final    Comment:    Calculated using the CKD-EPI Creatinine Equation (2021)   eGFR  Date Value Ref Range Status  05/16/2023 76 > OR = 60 mL/min/1.67m2 Final         Failed - Valid encounter within last 6 months    Recent Outpatient Visits           12 months ago Primary hypertension   [place] [facility] [name], MD   1 year ago Acute rhinosinusitis   [place] [facility] [name], MD   1 year ago Foreign body in auricle of left ear, initial encounter   [place] [facility] [name], MD   1 year ago Epigastric pain   [place] [facility] [name], MD   1 year ago Epigastric pain   [place] [facility] [name], MD              Passed - Patient is not pregnant      Passed - Last BP in normal range    BP Readings from Last 1 Encounters:  07/30/23 124/66

## 2024-05-14 DIAGNOSIS — M5416 Radiculopathy, lumbar region: Secondary | ICD-10-CM | POA: Diagnosis not present

## 2024-06-08 ENCOUNTER — Other Ambulatory Visit: Payer: Self-pay | Admitting: Diagnostic Radiology

## 2024-06-08 DIAGNOSIS — N2889 Other specified disorders of kidney and ureter: Secondary | ICD-10-CM

## 2024-07-01 ENCOUNTER — Other Ambulatory Visit: Payer: Self-pay | Admitting: Diagnostic Radiology

## 2024-07-01 DIAGNOSIS — N2889 Other specified disorders of kidney and ureter: Secondary | ICD-10-CM

## 2024-07-08 ENCOUNTER — Other Ambulatory Visit: Payer: Self-pay | Admitting: Family Medicine

## 2024-07-13 ENCOUNTER — Encounter: Payer: Self-pay | Admitting: Family Medicine

## 2024-07-13 ENCOUNTER — Ambulatory Visit: Admitting: Family Medicine

## 2024-07-13 VITALS — BP 124/70 | HR 67 | Ht 74.0 in | Wt 211.6 lb

## 2024-07-13 DIAGNOSIS — R6889 Other general symptoms and signs: Secondary | ICD-10-CM | POA: Diagnosis not present

## 2024-07-13 DIAGNOSIS — J209 Acute bronchitis, unspecified: Secondary | ICD-10-CM

## 2024-07-13 DIAGNOSIS — B351 Tinea unguium: Secondary | ICD-10-CM

## 2024-07-13 DIAGNOSIS — H6121 Impacted cerumen, right ear: Secondary | ICD-10-CM

## 2024-07-13 LAB — INFLUENZA A AND B AG, IMMUNOASSAY
INFLUENZA A ANTIGEN: NOT DETECTED
INFLUENZA B ANTIGEN: NOT DETECTED

## 2024-07-13 MED ORDER — BENZONATATE 100 MG PO CAPS: 100.0000 mg | ORAL_CAPSULE | Freq: Three times a day (TID) | ORAL | 0 refills | Status: DC | PRN

## 2024-07-13 MED ORDER — AMOXICILLIN 875 MG PO TABS: 875.0000 mg | ORAL_TABLET | Freq: Two times a day (BID) | ORAL | 0 refills | Status: DC

## 2024-07-13 NOTE — Progress Notes (Signed)
 "  Patient Office Visit  Assessment & Plan:  Acute bronchitis, unspecified organism -     Amoxicillin ; Take 1 tablet (875 mg total) by mouth 2 (two) times daily for 10 days.  Dispense: 20 tablet; Refill: 0 -     Benzonatate ; Take 1 capsule (100 mg total) by mouth 3 (three) times daily as needed.  Dispense: 30 capsule; Refill: 0  Flu-like symptoms -     Influenza A and B Ag, Immunoassay  Impacted cerumen of right ear -     Ear Lavage  Fungal toenail infection   Assessment and Plan    Acute flu-like illness Symptoms suggest influenza due to recent exposure and current prevalence. - Ordered influenza test.  Impacted cerumen, right ear Right ear discomfort due to cerumen impaction. Partial removal achieved. - Performed ear lavage for right ear.  Calluses and possible tinea pedis Calluses softened with topical antifungal. No infection signs. Podiatrist appointment scheduled. - Follow up with podiatrist in three weeks.      Medications sent to the pharmacy.  Patient has upcoming podiatry appointment.  If worsening symptoms he is to notify us  and return for reevaluation.    No follow-ups on file.   Subjective:    Patient ID: Jesse KANDICE Claudene Mickey., male    DOB: 1935-05-26  Age: 89 y.o. MRN: [id number]  Chief Complaint  Patient presents with   Nasal Congestion    Pt states he has had a cough and nasal congestion for 1x week.     HPI Discussed the use of AI scribe software for clinical note transcription with the patient, who gave verbal consent to proceed.  History of Present Illness        History of Present Illness Jesse Saunders. is an 89 year old male who presents with worsening respiratory symptoms and concerns about pneumonia, right ear feels clogged and his feet are getting worse  He has been experiencing worsening respiratory symptoms over the past several days. Initially, he had fever and chills, but these have resolved. He is concerned about the possibility of  pneumonia, especially since his son, who visited from [place]  last week, was ill with a cough and runny nose. He has received both flu shots this season but has not had a recent pneumonia vaccination.  He has been taking Tylenol  for symptom relief, which provides some relief, but he cannot take Advil due to stomach issues. He tried one dose of Corsetia, which did not help and caused urinary retention. No recent changes in his medication regimen.  He reports postnasal drip and a sensation of congestion in his face, though it is not painful. He also notes that his heart rate seems to increase with activity. He reports that he has not had pneumonia or bronchitis in the past.  He mentions issues with his feet, describing them as having rough spots and calluses. He has been applying a topical treatment for almost a year, which has softened the skin, allowing him to trim his nails, which were previously curled. He is scheduled to see a podiatrist in three weeks.  He lives nearby, across the road from the clinic, and drove himself to the appointment.  Physical Exam HEENT: Postnasal drip in throat. Right ear with cerumen, not completely clogged. Left ear normal. CARDIOVASCULAR: Tachycardia. EXTREMITIES: Callus and fungal infection on foot.  Results Cerumen removal, right ear (ear lavage and manual extraction) Ear lavage performed on right ear. Partial removal of cerumen; residual cerumen present but  canal improved. No significant pain or complications during procedure.  Assessment and Plan Acute flu-like illness Symptoms suggest influenza due to recent exposure and current prevalence. - Ordered influenza test- negative   Impacted cerumen, right ear Right ear discomfort due to cerumen impaction. Partial removal achieved. - Performed ear lavage for right ear.  Calluses and possible tinea pedis Calluses softened with topical antifungal. No infection signs. Podiatrist appointment  scheduled. - Follow up with podiatrist in three weeks.    The ASCVD Risk score (Arnett DK, et al., 2019) failed to calculate for the following reasons:   The 2019 ASCVD risk score is only valid for ages 34 to 57   * - Cholesterol units were assumed  Past Medical History:  Diagnosis Date   Arthritis    Ascending aorta dilation    Blood in stool    Hemorrhoids    History of kidney stones    Hyperlipidemia    Hypertension    Pre-diabetes    Rectal fissure    Rectal pain    Skin cancer    Past Surgical History:  Procedure Laterality Date   HERNIA REPAIR  2000 (approx)   IR RADIOLOGIST EVAL & MGMT  11/09/2021   IR RADIOLOGIST EVAL & MGMT  01/15/2022   lower back surgery  05/1995, 05/1998   RADIOLOGY WITH ANESTHESIA Left 12/20/2021   Procedure: CT WITH ANESTHESIA CRYOABLATION;  Surgeon: Philip Cornet, MD;  Location: [facility];  Service: Anesthesiology;  Laterality: Left;   SHOULDER SURGERY  1997   both   Social History[1] History reviewed. No pertinent family history. Allergies[2]  ROS    Objective:    BP 124/70 (BP Location: Left Arm, Patient Position: Sitting, Cuff Size: Normal)   Pulse 67   Ht 6' 2 (1.88 m)   Wt 211 lb 9.6 oz (96 kg)   SpO2 98%   BMI 27.17 kg/m  BP Readings from Last 3 Encounters:  07/13/24 124/70  07/30/23 124/66  05/16/23 120/70   Wt Readings from Last 3 Encounters:  07/13/24 211 lb 9.6 oz (96 kg)  07/30/23 219 lb 2 oz (99.4 kg)  05/16/23 208 lb 2 oz (94.4 kg)    Physical Exam Vitals and nursing note reviewed.  Constitutional:      General: He is not in acute distress.    Appearance: Normal appearance. He is not ill-appearing.  HENT:     Head: Normocephalic.     Comments: After ear lavage right ear had remaining cerumen which was manually extracted with curette (most removed). Patient tolerated the procedure    Right Ear: There is impacted cerumen.     Left Ear: Tympanic membrane, ear canal and external ear normal. There is no impacted  cerumen.     Nose: Congestion present.     Mouth/Throat:     Pharynx: No oropharyngeal exudate or posterior oropharyngeal erythema.  Eyes:     Extraocular Movements: Extraocular movements intact.     Pupils: Pupils are equal, round, and reactive to light.  Cardiovascular:     Rate and Rhythm: Regular rhythm. Tachycardia present.     Heart sounds: Normal heart sounds.  Pulmonary:     Effort: Pulmonary effort is normal.     Breath sounds: Normal breath sounds. No wheezing or rhonchi.  Musculoskeletal:     Right lower leg: No edema.     Left lower leg: No edema.     Left foot: Deformity and bunion present.     Comments: Patient has callus  along the greater toe and also laterally 5th toe, no skin breakdown, has discoloration and thickened toenails  Neurological:     General: No focal deficit present.     Mental Status: He is alert and oriented to person, place, and time.  Psychiatric:        Mood and Affect: Mood normal.        Behavior: Behavior normal.      Results for orders placed or performed in visit on 07/13/24  Influenza A and B Ag, Immunoassay  Result Value Ref Range   Source: NASAL    INFLUENZA A ANTIGEN NOT DETECTED NOT DETECTED   INFLUENZA B ANTIGEN NOT DETECTED NOT DETECTED            [1]  Social History Tobacco Use   Smoking status: Never   Smokeless tobacco: Never  Vaping Use   Vaping status: Never Used  Substance Use Topics   Alcohol use: No   Drug use: No  [2]  Allergies Allergen Reactions   Doxycycline      GI upset    "

## 2024-07-14 ENCOUNTER — Ambulatory Visit: Payer: Self-pay | Admitting: *Deleted

## 2024-07-14 ENCOUNTER — Ambulatory Visit: Admitting: Family Medicine

## 2024-07-14 ENCOUNTER — Encounter: Payer: Self-pay | Admitting: Family Medicine

## 2024-07-14 VITALS — BP 122/60 | HR 70 | Temp 98.0°F | Ht 74.0 in | Wt 211.1 lb

## 2024-07-14 DIAGNOSIS — R630 Anorexia: Secondary | ICD-10-CM | POA: Diagnosis not present

## 2024-07-14 DIAGNOSIS — J209 Acute bronchitis, unspecified: Secondary | ICD-10-CM

## 2024-07-14 DIAGNOSIS — R1111 Vomiting without nausea: Secondary | ICD-10-CM

## 2024-07-14 LAB — CBC WITH DIFFERENTIAL/PLATELET
Absolute Lymphocytes: 970 {cells}/uL (ref 850–3900)
Absolute Monocytes: 1552 {cells}/uL — ABNORMAL HIGH (ref 200–950)
Basophils Absolute: 39 {cells}/uL (ref 0–200)
Basophils Relative: 0.2 %
Eosinophils Absolute: 58 {cells}/uL (ref 15–500)
Eosinophils Relative: 0.3 %
HCT: 45.1 % (ref 39.4–51.1)
Hemoglobin: 15.2 g/dL (ref 13.2–17.1)
MCH: 29.9 pg (ref 27.0–33.0)
MCHC: 33.7 g/dL (ref 31.6–35.4)
MCV: 88.6 fL (ref 81.4–101.7)
MPV: 9.8 fL (ref 7.5–12.5)
Monocytes Relative: 8 %
Neutro Abs: 16781 {cells}/uL — ABNORMAL HIGH (ref 1500–7800)
Neutrophils Relative %: 86.5 %
Platelets: 392 Thousand/uL (ref 140–400)
RBC: 5.09 Million/uL (ref 4.20–5.80)
RDW: 12.9 % (ref 11.0–15.0)
Total Lymphocyte: 5 %
WBC: 19.4 Thousand/uL — ABNORMAL HIGH (ref 3.8–10.8)

## 2024-07-14 LAB — COMPREHENSIVE METABOLIC PANEL WITH GFR
AG Ratio: 1.3 (calc) (ref 1.0–2.5)
ALT: 28 U/L (ref 9–46)
AST: 53 U/L — ABNORMAL HIGH (ref 10–35)
Albumin: 3.8 g/dL (ref 3.6–5.1)
Alkaline phosphatase (APISO): 92 U/L (ref 35–144)
BUN: 14 mg/dL (ref 7–25)
CO2: 28 mmol/L (ref 20–32)
Calcium: 9.4 mg/dL (ref 8.6–10.3)
Chloride: 84 mmol/L — ABNORMAL LOW (ref 98–110)
Creat: 0.88 mg/dL (ref 0.70–1.22)
Globulin: 2.9 g/dL (ref 1.9–3.7)
Glucose, Bld: 141 mg/dL — ABNORMAL HIGH (ref 65–99)
Potassium: 4.5 mmol/L (ref 3.5–5.3)
Sodium: 122 mmol/L — ABNORMAL LOW (ref 135–146)
Total Bilirubin: 0.8 mg/dL (ref 0.2–1.2)
Total Protein: 6.7 g/dL (ref 6.1–8.1)
eGFR: 83 mL/min/1.73m2

## 2024-07-14 LAB — POC COVID19 BINAXNOW: SARS Coronavirus 2 Ag: NEGATIVE

## 2024-07-14 LAB — TSH: TSH: 1.96 m[IU]/L (ref 0.40–4.50)

## 2024-07-14 MED ORDER — AZITHROMYCIN 250 MG PO TABS: ORAL_TABLET | ORAL | 0 refills | Status: DC

## 2024-07-14 MED ORDER — ONDANSETRON HCL 4 MG PO TABS: 4.0000 mg | ORAL_TABLET | Freq: Three times a day (TID) | ORAL | 0 refills | Status: AC | PRN

## 2024-07-14 NOTE — Progress Notes (Signed)
 "  Patient Office Visit  Assessment & Plan:  Acute bronchitis, unspecified organism -     Azithromycin ; Take 2 tablets (500 mg) PO today, then 1 tablet (250 mg) PO daily x4 days.  Dispense: 6 tablet; Refill: 0  No appetite -     POC COVID-19 BinaxNow -     CBC with Differential/Platelet -     Comprehensive metabolic panel with GFR -     TSH  Vomiting without nausea, unspecified vomiting type -     POC COVID-19 BinaxNow  Other orders -     Ondansetron  HCl; Take 1 tablet (4 mg total) by mouth every 8 (eight) hours as needed for nausea or vomiting.  Dispense: 20 tablet; Refill: 0   Assessment and Plan    Acute bronchitis Persistent cough and headache. Negative for influenza and COVID-19. Differential includes viral infection or walking pneumonia. Concern for potential bacterial infection due to age and symptoms. - Switched antibiotic to Z-Pak (azithromycin ). - Ordered blood work for anemia and thyroid  function. - Encouraged hydration with fluids.  Vomiting and decreased appetite Likely secondary to antibiotic reaction. No nausea reported, but risk of vomiting persists. - Prescribed Zofran  for nausea as needed. - Encouraged hydration.     If not improving will order chest x-ray. Follow up on lab work and notify patient Told patient I was concerned about him becoming dehydrated. If worsening symptoms may need to go to ED for further evaluation Return if symptoms worsen or fail to improve.   Subjective:    Patient ID: Jesse KANDICE Claudene Mickey., male    DOB: 15-Sep-1934  Age: 89 y.o. MRN: [id number]  Chief Complaint  Patient presents with   Medication Reaction    Vomiting/no appetite after taking amoxicillin .     HPI Discussed the use of AI scribe software for clinical note transcription with the patient, who gave verbal consent to proceed.  History of Present Illness        History of Present Illness Jesse Fletcher. is an 89 year old male who presents with worsening cough and  gastrointestinal symptoms after starting antibiotics.  He has experienced a worsening cough, nausea, and vomiting after starting antibiotics, which he has taken before without issues. He has been unable to eat since yesterday morning due to nausea and vomiting.  He describes a headache and a severe cough but denies wheezing or significant shortness of breath. He has not slept well, estimating only about an hour of sleep last night. He reports a lack of appetite over the past week and has lost a pound since yesterday.  He has been checked for flu and COVID-19, both of which were negative. He mentions that his heart rate was elevated yesterday but fluctuated during the day.  He lives alone and receives calls from his daughter every evening.  Physical Exam VITALS: SaO2- 97% HEENT: Ears and throat normal. CHEST: Lungs clear to auscultation.  Results Labs Influenza A and B (07/13/2024): Negative COVID-19 (07/14/2024): Negative  Assessment and Plan Acute bronchitis Persistent cough and headache. Negative for influenza and COVID-19. Differential includes viral infection or walking pneumonia. Concern for potential bacterial infection due to age and symptoms. - Switched antibiotic to Z-Pak (azithromycin ). - Ordered blood work for anemia and thyroid  function. - Encouraged hydration with fluids.  Vomiting and decreased appetite Likely secondary to antibiotic reaction. No nausea reported but does feel queasy, but risk of vomiting persists. - Prescribed Zofran  for nausea as needed. - Encouraged hydration.  The ASCVD Risk score (Arnett DK, et al., 2019) failed to calculate for the following reasons:   The 2019 ASCVD risk score is only valid for ages 39 to 64   * - Cholesterol units were assumed  Past Medical History:  Diagnosis Date   Arthritis    Ascending aorta dilation    Blood in stool    Hemorrhoids    History of kidney stones    Hyperlipidemia    Hypertension    Pre-diabetes     Rectal fissure    Rectal pain    Skin cancer    Past Surgical History:  Procedure Laterality Date   HERNIA REPAIR  2000 (approx)   IR RADIOLOGIST EVAL & MGMT  11/09/2021   IR RADIOLOGIST EVAL & MGMT  01/15/2022   lower back surgery  05/1995, 05/1998   RADIOLOGY WITH ANESTHESIA Left 12/20/2021   Procedure: CT WITH ANESTHESIA CRYOABLATION;  Surgeon: Philip Cornet, MD;  Location: [facility];  Service: Anesthesiology;  Laterality: Left;   SHOULDER SURGERY  1997   both   Social History[1] History reviewed. No pertinent family history. Allergies[2]  ROS    Objective:    BP 122/60   Pulse 70   Temp 98 F (36.7 C)   Ht 6' 2 (1.88 m)   Wt 211 lb 2 oz (95.8 kg)   SpO2 97%   BMI 27.11 kg/m  BP Readings from Last 3 Encounters:  07/14/24 122/60  07/13/24 124/70  07/30/23 124/66   Wt Readings from Last 3 Encounters:  07/14/24 211 lb 2 oz (95.8 kg)  07/13/24 211 lb 9.6 oz (96 kg)  07/30/23 219 lb 2 oz (99.4 kg)    Physical Exam Vitals and nursing note reviewed.  Constitutional:      General: He is not in acute distress.    Appearance: Normal appearance.  HENT:     Head: Normocephalic.     Right Ear: Tympanic membrane, ear canal and external ear normal.     Left Ear: Tympanic membrane, ear canal and external ear normal.  Eyes:     Extraocular Movements: Extraocular movements intact.     Pupils: Pupils are equal, round, and reactive to light.  Cardiovascular:     Rate and Rhythm: Regular rhythm. Tachycardia present.     Heart sounds: Normal heart sounds.  Pulmonary:     Effort: Pulmonary effort is normal.     Breath sounds: Normal breath sounds.  Musculoskeletal:     Right lower leg: No edema.     Left lower leg: No edema.  Neurological:     General: No focal deficit present.     Mental Status: He is alert and oriented to person, place, and time.  Psychiatric:        Mood and Affect: Mood normal.        Behavior: Behavior normal.      Results for orders placed or  performed in visit on 07/14/24  POC COVID-19  Result Value Ref Range   SARS Coronavirus 2 Ag Negative Negative            [1]  Social History Tobacco Use   Smoking status: Never   Smokeless tobacco: Never  Vaping Use   Vaping status: Never Used  Substance Use Topics   Alcohol use: No   Drug use: No  [2]  Allergies Allergen Reactions   Doxycycline      GI upset    "

## 2024-07-14 NOTE — Telephone Encounter (Signed)
 FYI Only or Action Required?: FYI only for provider: appointment scheduled on 1/13.  Patient was last seen in primary care on 07/13/2024 by Aletha Bene, MD.  Called Nurse Triage reporting Medication Reaction.  Symptoms began yesterday.  Interventions attempted: Nothing.  Symptoms are: gradually worsening.  Triage Disposition: Call PCP Now  Patient/caregiver understands and will follow disposition?: yes  Copied from CRM #[id number]. Topic: Clinical - Red Word Triage >> Jul 14, 2024  8:04 AM Antwanette L wrote: Red Word that prompted transfer to Nurse Triage:  he patient was prescribed a medication( amoxicillin  (AMOXIL ) 875 MG tablet)that caused significant stomach upset. After attempting to eat, the patient vomited and experienced abdominal pain. Reason for Disposition  [1] Caller has URGENT medicine question about med that primary care doctor (or NP/PA) or specialist prescribed AND [2] triager unable to answer question  Answer Assessment - Initial Assessment Questions 1. NAME of MEDICINE: What medicine(s) are you calling about?      amoxicillin  (AMOXIL ) 875 MG tablet) 2. QUESTION: What is your question? (e.g., double dose of medicine, side effect)     Patient is requesting alternative medictaion 3. PRESCRIBER: Who prescribed the medicine? Reason: if prescribed by specialist, call should be referred to that group.     PCP 4. SYMPTOMS: Do you have any symptoms? If Yes, ask: What symptoms are you having?  How bad are the symptoms (e.g., mild, moderate, severe)     After first dose patient stomach stated rolling/ really upset- this morning patient vomited. Patient only took 1 dose. Patient is requesting something to settle his stomach- and possibly another antibiotic.  Protocols used: Medication Question Call-A-AH

## 2024-07-15 ENCOUNTER — Inpatient Hospital Stay (HOSPITAL_BASED_OUTPATIENT_CLINIC_OR_DEPARTMENT_OTHER)
Admission: EM | Admit: 2024-07-15 | Discharge: 2024-07-18 | DRG: 194 | Disposition: A | Discharge status: Home / Self Care | Source: Ambulatory Visit | Attending: Internal Medicine | Admitting: Internal Medicine

## 2024-07-15 ENCOUNTER — Ambulatory Visit (HOSPITAL_COMMUNITY)
Admission: RE | Admit: 2024-07-15 | Discharge: 2024-07-15 | Disposition: A | Source: Ambulatory Visit | Attending: Diagnostic Radiology | Admitting: Diagnostic Radiology

## 2024-07-15 ENCOUNTER — Other Ambulatory Visit: Payer: Self-pay

## 2024-07-15 ENCOUNTER — Ambulatory Visit: Payer: Self-pay | Admitting: Family Medicine

## 2024-07-15 ENCOUNTER — Emergency Department (HOSPITAL_BASED_OUTPATIENT_CLINIC_OR_DEPARTMENT_OTHER): Admitting: Radiology

## 2024-07-15 DIAGNOSIS — R0602 Shortness of breath: Secondary | ICD-10-CM | POA: Diagnosis present

## 2024-07-15 DIAGNOSIS — Z7982 Long term (current) use of aspirin: Secondary | ICD-10-CM

## 2024-07-15 DIAGNOSIS — Z85828 Personal history of other malignant neoplasm of skin: Secondary | ICD-10-CM | POA: Diagnosis not present

## 2024-07-15 DIAGNOSIS — A419 Sepsis, unspecified organism: Secondary | ICD-10-CM | POA: Diagnosis present

## 2024-07-15 DIAGNOSIS — M6282 Rhabdomyolysis: Secondary | ICD-10-CM | POA: Diagnosis present

## 2024-07-15 DIAGNOSIS — R7401 Elevation of levels of liver transaminase levels: Secondary | ICD-10-CM | POA: Diagnosis present

## 2024-07-15 DIAGNOSIS — Z85528 Personal history of other malignant neoplasm of kidney: Secondary | ICD-10-CM | POA: Diagnosis not present

## 2024-07-15 DIAGNOSIS — Z1152 Encounter for screening for COVID-19: Secondary | ICD-10-CM | POA: Diagnosis not present

## 2024-07-15 DIAGNOSIS — J9601 Acute respiratory failure with hypoxia: Secondary | ICD-10-CM | POA: Diagnosis present

## 2024-07-15 DIAGNOSIS — E878 Other disorders of electrolyte and fluid balance, not elsewhere classified: Secondary | ICD-10-CM | POA: Diagnosis present

## 2024-07-15 DIAGNOSIS — I10 Essential (primary) hypertension: Secondary | ICD-10-CM | POA: Diagnosis present

## 2024-07-15 DIAGNOSIS — E7800 Pure hypercholesterolemia, unspecified: Secondary | ICD-10-CM | POA: Diagnosis not present

## 2024-07-15 DIAGNOSIS — I2510 Atherosclerotic heart disease of native coronary artery without angina pectoris: Secondary | ICD-10-CM | POA: Diagnosis present

## 2024-07-15 DIAGNOSIS — E871 Hypo-osmolality and hyponatremia: Secondary | ICD-10-CM | POA: Diagnosis present

## 2024-07-15 DIAGNOSIS — E860 Dehydration: Secondary | ICD-10-CM | POA: Diagnosis present

## 2024-07-15 DIAGNOSIS — N2889 Other specified disorders of kidney and ureter: Secondary | ICD-10-CM

## 2024-07-15 DIAGNOSIS — N289 Disorder of kidney and ureter, unspecified: Secondary | ICD-10-CM

## 2024-07-15 DIAGNOSIS — J189 Pneumonia, unspecified organism: Secondary | ICD-10-CM | POA: Diagnosis present

## 2024-07-15 DIAGNOSIS — E785 Hyperlipidemia, unspecified: Secondary | ICD-10-CM | POA: Diagnosis present

## 2024-07-15 DIAGNOSIS — Z79899 Other long term (current) drug therapy: Secondary | ICD-10-CM | POA: Diagnosis not present

## 2024-07-15 LAB — COMPREHENSIVE METABOLIC PANEL WITH GFR
ALT: 53 U/L — ABNORMAL HIGH (ref 0–44)
AST: 116 U/L — ABNORMAL HIGH (ref 15–41)
Albumin: 3.6 g/dL (ref 3.5–5.0)
Alkaline Phosphatase: 98 U/L (ref 38–126)
Anion gap: 13 (ref 5–15)
BUN: 19 mg/dL (ref 8–23)
CO2: 25 mmol/L (ref 22–32)
Calcium: 9.7 mg/dL (ref 8.9–10.3)
Chloride: 82 mmol/L — ABNORMAL LOW (ref 98–111)
Creatinine, Ser: 1.12 mg/dL (ref 0.61–1.24)
GFR, Estimated: >60 mL/min
Glucose, Bld: 144 mg/dL — ABNORMAL HIGH (ref 70–99)
Potassium: 4 mmol/L (ref 3.5–5.1)
Sodium: 120 mmol/L — ABNORMAL LOW (ref 135–145)
Total Bilirubin: 0.6 mg/dL (ref 0.0–1.2)
Total Protein: 7 g/dL (ref 6.5–8.1)

## 2024-07-15 LAB — RESPIRATORY PANEL BY PCR

## 2024-07-15 LAB — CBC WITH DIFFERENTIAL/PLATELET
Abs Immature Granulocytes: 0.06 K/uL (ref 0.00–0.07)
Basophils Absolute: 0 K/uL (ref 0.0–0.1)
Basophils Relative: 0 %
Eosinophils Absolute: 0 K/uL (ref 0.0–0.5)
Eosinophils Relative: 0 %
HCT: 40.9 % (ref 39.0–52.0)
Hemoglobin: 14.4 g/dL (ref 13.0–17.0)
Immature Granulocytes: 0 %
Lymphocytes Relative: 6 %
Lymphs Abs: 1 K/uL (ref 0.7–4.0)
MCH: 29.8 pg (ref 26.0–34.0)
MCHC: 35.2 g/dL (ref 30.0–36.0)
MCV: 84.7 fL (ref 80.0–100.0)
Monocytes Absolute: 1.3 K/uL — ABNORMAL HIGH (ref 0.1–1.0)
Monocytes Relative: 8 %
Neutro Abs: 14.2 K/uL — ABNORMAL HIGH (ref 1.7–7.7)
Neutrophils Relative %: 86 %
Platelets: 395 K/uL (ref 150–400)
RBC: 4.83 MIL/uL (ref 4.22–5.81)
RDW: 13.1 % (ref 11.5–15.5)
WBC: 16.6 K/uL — ABNORMAL HIGH (ref 4.0–10.5)
nRBC: 0 % (ref 0.0–0.2)

## 2024-07-15 LAB — RESP PANEL BY RT-PCR (RSV, FLU A&B, COVID)  RVPGX2
Influenza A by PCR: NEGATIVE
Influenza B by PCR: NEGATIVE
Resp Syncytial Virus by PCR: NEGATIVE
SARS Coronavirus 2 by RT PCR: NEGATIVE

## 2024-07-15 LAB — LACTIC ACID, PLASMA
Lactic Acid, Venous: 1.2 mmol/L (ref 0.5–1.9)
Lactic Acid, Venous: 1.4 mmol/L (ref 0.5–1.9)

## 2024-07-15 LAB — URINALYSIS, W/ REFLEX TO CULTURE (INFECTION SUSPECTED)
Bacteria, UA: NONE SEEN
Bilirubin Urine: NEGATIVE
Cellular Cast, UA: 7
Glucose, UA: NEGATIVE mg/dL
Ketones, ur: 15 mg/dL — AB
Leukocytes,Ua: NEGATIVE
Nitrite: NEGATIVE
Protein, ur: 30 mg/dL — AB
Specific Gravity, Urine: 1.028 (ref 1.005–1.030)
pH: 6.5 (ref 5.0–8.0)

## 2024-07-15 LAB — TROPONIN T, HIGH SENSITIVITY
Troponin T High Sensitivity: <15 ng/L (ref 0–19)
Troponin T High Sensitivity: <15 ng/L (ref 0–19)

## 2024-07-15 LAB — PRO BRAIN NATRIURETIC PEPTIDE: Pro Brain Natriuretic Peptide: 538 pg/mL — ABNORMAL HIGH

## 2024-07-15 LAB — PROTIME-INR
INR: 1 (ref 0.8–1.2)
Prothrombin Time: 13.8 s (ref 11.4–15.2)

## 2024-07-15 MED ORDER — SODIUM CHLORIDE 0.9 % IV SOLN
1.0000 g | Freq: Once | INTRAVENOUS | Status: AC
  Administered 2024-07-15: 1 g via INTRAVENOUS
  Filled 2024-07-15: qty 10

## 2024-07-15 MED ORDER — SODIUM CHLORIDE 0.9 % IV SOLN: INTRAVENOUS | Status: DC

## 2024-07-15 MED ORDER — AZITHROMYCIN 250 MG PO TABS
500.0000 mg | ORAL_TABLET | Freq: Every day | ORAL | Status: DC
  Administered 2024-07-15: 500 mg via ORAL
  Filled 2024-07-15: qty 2

## 2024-07-15 MED ORDER — GADOBUTROL 1 MMOL/ML IV SOLN
9.0000 mL | Freq: Once | INTRAVENOUS | Status: AC | PRN
  Administered 2024-07-15: 9 mL via INTRAVENOUS

## 2024-07-15 MED ORDER — IPRATROPIUM-ALBUTEROL 0.5-2.5 (3) MG/3ML IN SOLN
3.0000 mL | Freq: Once | RESPIRATORY_TRACT | Status: AC
  Administered 2024-07-15: 3 mL via RESPIRATORY_TRACT
  Filled 2024-07-15: qty 3

## 2024-07-15 MED ORDER — ACETAMINOPHEN 500 MG PO TABS
1000.0000 mg | ORAL_TABLET | Freq: Once | ORAL | Status: AC
  Administered 2024-07-15: 1000 mg via ORAL
  Filled 2024-07-15: qty 2

## 2024-07-15 NOTE — ED Provider Notes (Signed)
 " [place] [facility] AT [facility] Provider Note   CSN: [id number] Arrival date & time: 07/15/24  1109     Patient presents with: Abnormal Lab   Jesse Fletcher. is a 89 y.o. male.   HPI Patient where she had a very bad cough for a week.  He reports his cough so hard he thinks it is muscles in his abdomen are sore.  Patient reports has had decreased appetite.  He has not had any vomiting or abdominal pain however.  No diarrhea.  Patient does report that 2 days ago he was put on an antibiotic and he feels that it really made his stomach worse.  MR shows patient had PCP visit 1\12\26 and was started on amoxicillin  twice daily.  Patient reports he has also had a lot of sinus or nasal congestion.  He denies chest pain with coughing or shortness of breath.  He does report over the past week however he felt very generally fatigued.  Patient lab work done by his PCP a couple of days ago and they called him today with concerns for elevated white blood cell count and recommended coming to the emergency department for further evaluation.  Patient denies lower extremity swelling calf pain.  Denies skin rash.    Prior to Admission medications  Medication Sig Start Date End Date Taking? Authorizing Provider  amoxicillin  (AMOXIL ) 875 MG tablet Take 1 tablet (875 mg total) by mouth 2 (two) times daily for 10 days. 07/13/24 07/23/24  Aletha Bene, MD  aspirin EC 81 MG tablet Take 81 mg by mouth daily. Swallow whole.    [provider]  azithromycin  (ZITHROMAX  Z-PAK) 250 MG tablet Take 2 tablets (500 mg) PO today, then 1 tablet (250 mg) PO daily x4 days. 07/14/24   Aletha Bene, MD  benzonatate  (TESSALON ) 100 MG capsule Take 1 capsule (100 mg total) by mouth 3 (three) times daily as needed. 07/13/24   Aletha Bene, MD  lisinopril -hydrochlorothiazide  (ZESTORETIC ) 10-12.5 MG tablet Take 1 tablet by mouth twice daily 05/15/24   [name], MD  metoprolol  tartrate  (LOPRESSOR ) 25 MG tablet Take 1 tablet by mouth twice daily 07/08/24   [name], MD  Multiple Vitamins-Minerals (CENTRUM SILVER ADULT 50+ PO) Take 1 tablet by mouth daily.    [provider]  neomycin -polymyxin-hydrocortisone (CORTISPORIN) OTIC solution Place 3 drops into the left ear 4 (four) times daily. 08/21/22   [name], MD  ondansetron  (ZOFRAN ) 4 MG tablet Take 1 tablet (4 mg total) by mouth every 8 (eight) hours as needed for nausea or vomiting. 07/14/24   Aletha Bene, MD  simvastatin  (ZOCOR ) 20 MG tablet TAKE 1 TABLET BY MOUTH AT BEDTIME 07/08/24   [name], MD    Allergies: Doxycycline     Review of Systems  Updated Vital Signs BP (!) 151/78   Pulse (!) 104   Temp 98.9 F (37.2 C) (Oral)   Resp (!) 21   Ht 6' 2 (1.88 m)   Wt 95.7 kg   SpO2 94%   BMI 27.09 kg/m   Physical Exam Constitutional:      Comments: Patient is alert nontoxic.  Clear mental status.  No respiratory distress at rest.  Occasional wet sounding cough.  HENT:     Head: Normocephalic and atraumatic.     Mouth/Throat:     Pharynx: Oropharynx is clear.  Cardiovascular:     Rate and Rhythm: Normal rate and regular rhythm.  Pulmonary:  Comments: Respiratory distress.  Intermittent wet cough.  Crackles at left lung base.  Right lung clear. Abdominal:     General: There is no distension.     Palpations: Abdomen is soft.     Tenderness: There is no abdominal tenderness. There is no guarding.  Musculoskeletal:        General: No swelling or tenderness. Normal range of motion.     Right lower leg: No edema.     Left lower leg: No edema.  Skin:    General: Skin is warm and dry.  Neurological:     General: No focal deficit present.     Mental Status: He is oriented to person, place, and time.     Motor: No weakness.     Coordination: Coordination normal.  Psychiatric:        Mood and Affect: Mood normal.     (all labs ordered are listed, but only abnormal  results are displayed) Labs Reviewed  CBC WITH DIFFERENTIAL/PLATELET - Abnormal; Notable for the following components:      Result Value   WBC 16.6 (*)    Neutro Abs 14.2 (*)    Monocytes Absolute 1.3 (*)    All other components within normal limits  COMPREHENSIVE METABOLIC PANEL WITH GFR - Abnormal; Notable for the following components:   Sodium 120 (*)    Chloride 82 (*)    Glucose, Bld 144 (*)    AST 116 (*)    ALT 53 (*)    All other components within normal limits  PRO BRAIN NATRIURETIC PEPTIDE - Abnormal; Notable for the following components:   Pro Brain Natriuretic Peptide 538.0 (*)    All other components within normal limits  URINALYSIS, W/ REFLEX TO CULTURE (INFECTION SUSPECTED) - Abnormal; Notable for the following components:   Hgb urine dipstick MODERATE (*)    Ketones, ur 15 (*)    Protein, ur 30 (*)    All other components within normal limits  RESP PANEL BY RT-PCR (RSV, FLU A&B, COVID)  RVPGX2  CULTURE, BLOOD (ROUTINE X 2)  CULTURE, BLOOD (ROUTINE X 2)  RESPIRATORY PANEL BY PCR  LACTIC ACID, PLASMA  PROTIME-INR  LACTIC ACID, PLASMA  LEGIONELLA PNEUMOPHILA SEROGP 1 UR AG  TROPONIN T, HIGH SENSITIVITY  TROPONIN T, HIGH SENSITIVITY    EKG: EKG Interpretation Date/Time:  Wednesday July 15 2024 11:27:18 EST Ventricular Rate:  111 PR Interval:  218 QRS Duration:  90 QT Interval:  292 QTC Calculation: 397 R Axis:   207  Text Interpretation: Sinus tachycardia with 1st degree A-V block Right superior axis deviation Anterior infarct , age undetermined Abnormal ECG When compared with ECG of 30-Jul-2023 13:58, poor anterior r wave progression Confirmed by Armenta Canning [id number]) on 07/15/2024 12:27:52 PM  Radiology: [facility] Chest 2 View Result Date: 07/15/2024 EXAM: PA AND LATERAL (2) VIEW(S) XRAY OF THE CHEST 07/15/2024 12:06:54 PM COMPARISON: PA and lateral radiographs of the chest dated 01/23/2018. CLINICAL HISTORY: FINDINGS: LUNGS AND PLEURA: Patchy airspace  opacity at left base, possibly representing atelectasis or pneumonia. No pleural effusion. No pneumothorax. HEART AND MEDIASTINUM: Aortic atherosclerosis. No acute abnormality of the cardiac and mediastinal silhouettes. BONES AND SOFT TISSUES: No acute osseous abnormality. IMPRESSION: 1. Patchy airspace opacity at the left base, possibly representing atelectasis or pneumonia. 2. Aortic atherosclerosis. Electronically signed by: Evalene Coho MD 07/15/2024 12:43 PM EST RP Workstation: [id number]   MR ABDOMEN WWO CONTRAST Result Date: 07/15/2024 CLINICAL DATA:  Left renal mass. EXAM: MRI ABDOMEN WITHOUT  AND WITH CONTRAST TECHNIQUE: Multiplanar multisequence MR imaging of the abdomen was performed both before and after the administration of intravenous contrast. CONTRAST:  9mL GADAVIST  GADOBUTROL  1 MMOL/ML IV SOLN COMPARISON:  MRI abdomen from 06/12/2023. FINDINGS: The technologist noted Patient wears hearing aids. Patient states he is unable to hear the breathing instructions even after adjusting volume. Best images obtained Lower chest: There are heterogeneous opacities in the bilateral lower lobes, left more than right, concerning for pneumonitis. Correlate clinically. No pleural effusion. Top-normal heart size. No pericardial effusion. Hepatobiliary: The liver is mildly enlarged in size measuring up to 16.5 cm in length. Noncirrhotic configuration. No suspicious liver lesion. No intrahepatic or extrahepatic bile duct dilatation. No choledocholithiasis. Unremarkable gallbladder. Pancreas: No mass, inflammatory changes or other parenchymal abnormality identified. No main pancreatic duct dilation. Spleen: Mildly enlarged measuring upto 6.3 x 12.9 cm orthogonally on coronal plane. No focal mass. Adrenals/Urinary Tract: Unremarkable adrenal glands. No hydroureteronephrosis. There are several scattered simple cysts throughout bilateral kidneys with largest arising from the right kidney lower pole, anteriorly  measuring up to 1.2 x 1.5 cm. Redemonstration of post ablation cavity in the left kidney interpolar region, laterally measuring approximately 2.0 x 2.8 cm. The cavity is Iso to slightly hyperintense on T1 weighted images, hypointense on T2 weighted images it does not exhibit significant contrast enhancement on subtraction/postcontrast images. Findings favor satisfactory response of cryoablation. No residual/recurrent viable tumor seen. Stomach/Bowel: Visualized portions within the abdomen are unremarkable. No disproportionate dilation of bowel loops. Vascular/Lymphatic: No pathologically enlarged lymph nodes identified. No abdominal aortic aneurysm demonstrated. No ascites. Other:  None. Musculoskeletal: No suspicious bone lesions identified. IMPRESSION: 1. Redemonstration of post ablation changes in the left kidney interpolar region. No residual/recurrent viable tumor seen. No metastatic disease identified within the abdomen. 2. There are heterogeneous opacities in the bilateral lower lobes, left more than right, concerning for pneumonitis. Correlate clinically. 3. Otherwise essentially unremarkable exam, as described above. Electronically Signed   By: Ree Molt M.D.   On: 07/15/2024 11:27     Procedures   Medications Ordered in the ED  0.9 %  sodium chloride  infusion ( Intravenous New Bag/Given 07/15/24 1348)  azithromycin  (ZITHROMAX ) tablet 500 mg (500 mg Oral Given 07/15/24 1349)  ipratropium-albuterol  (DUONEB) 0.5-2.5 (3) MG/3ML nebulizer solution 3 mL (3 mLs Nebulization Given 07/15/24 1334)  cefTRIAXone  (ROCEPHIN ) 1 g in sodium chloride  0.9 % 100 mL IVPB (0 g Intravenous Stopped 07/15/24 1418)                                    Medical Decision Making Amount and/or Complexity of Data Reviewed Labs: ordered. Radiology: ordered.  Risk Prescription drug management. Decision regarding hospitalization.   Patient presents as outlined with signs of respiratory illness over the past week.   Findings suggestive of pneumonia with productive cough and leukocytosis from outpatient workup.  Also, patient had routine MRI for monitoring of prior renal mass that showed some infiltrate at lung bases left greater than right concerning for infectious etiology.  I have fairly high suspicion for pneumonia in the setting of patient's symptoms, leukocytosis and physical exam positive for left lung base crackle.  Will proceed with sepsis workup.  Patient's mental status is clear.  He is not exhibiting respiratory distress.  First blood pressures are somewhat soft at 96/63 and heart rate of 111.  No fever documented in the ED.  Will continue to monitor vital  signs closely.  White count 16,000 H&H normal 14 and 40 platelets 395 neutrophils 14.2 sodium 120 AST 116 ALT 53 GFR greater than 60   Consult: [facility] hospital[facility] Dr. Jackalyn for admission.  Patient presents as outlined.  He does have positive findings of pneumonia on chest x-ray and also previously scheduled MRI that was done for surveillance of treated kidney tumor earlier today.  Patient is significantly hyponatremic at 120.  The patient lives alone independently.  At this time with significant hyponatremia and pneumonia, will plan for admission.  Patient is alert.  His mental status is clear.  Blood pressures are stable.    Final diagnoses:  Pneumonia of left lower lobe due to infectious organism  Hyponatremia    ED Discharge Orders     None          Armenta Canning, MD 07/15/24 1529  "

## 2024-07-15 NOTE — Telephone Encounter (Signed)
 Aletha Bene, MD  Angelena Ronal Slater [name], LPN Please see message I sent to [facility]. He needs to go to ED - may have sepsis/pneumonia. WBC 19.4, Sodium 122.   Pt advised and verbalized understanding of all. Mjp,lpn

## 2024-07-15 NOTE — Subjective & Objective (Signed)
 Patient presents with persistent cough for a week headache flulike symptoms was seen by his primary care provider on the 12th COVID and flu was negative diagnosed with acute bronchitis initially was started on amoxicillin  but that has caused some abdominal discomfort and then switched to Z-Pak Patient has been having significant nausea and vomiting had labs done that showed leukocytosis 19.4 and hyponatremia 122 and he was told to go to emergency department Patient presented to [facility] sodium was found to be 120 white blood cell count down to 16.6 Chest x-ray worrisome for pneumonia Patient noted to be hypoxic while sleeping and started on 2 L Of note patient was supposed to undergo MRI for surveillance of treatment kidney tumor

## 2024-07-15 NOTE — ED Notes (Signed)
 Patient placed on 2LNC d/t desaturations while sleeping.

## 2024-07-15 NOTE — ED Triage Notes (Signed)
 Pt referred from PCP for abnormal WBC (19.4). Pt states he was seen each of the past two days for fatigue and loss of appetite. Reports one week of symptoms. Had neg flu test and routine blood work.

## 2024-07-16 ENCOUNTER — Inpatient Hospital Stay (HOSPITAL_COMMUNITY)

## 2024-07-16 ENCOUNTER — Encounter (HOSPITAL_COMMUNITY): Payer: Self-pay | Admitting: Internal Medicine

## 2024-07-16 DIAGNOSIS — E7800 Pure hypercholesterolemia, unspecified: Secondary | ICD-10-CM | POA: Diagnosis not present

## 2024-07-16 DIAGNOSIS — E871 Hypo-osmolality and hyponatremia: Secondary | ICD-10-CM | POA: Diagnosis present

## 2024-07-16 DIAGNOSIS — R7401 Elevation of levels of liver transaminase levels: Secondary | ICD-10-CM | POA: Diagnosis present

## 2024-07-16 DIAGNOSIS — A419 Sepsis, unspecified organism: Secondary | ICD-10-CM | POA: Diagnosis not present

## 2024-07-16 DIAGNOSIS — J189 Pneumonia, unspecified organism: Secondary | ICD-10-CM

## 2024-07-16 DIAGNOSIS — J9601 Acute respiratory failure with hypoxia: Secondary | ICD-10-CM | POA: Diagnosis not present

## 2024-07-16 DIAGNOSIS — I10 Essential (primary) hypertension: Secondary | ICD-10-CM | POA: Diagnosis not present

## 2024-07-16 DIAGNOSIS — N289 Disorder of kidney and ureter, unspecified: Secondary | ICD-10-CM

## 2024-07-16 LAB — EXPECTORATED SPUTUM ASSESSMENT W GRAM STAIN, RFLX TO RESP C

## 2024-07-16 LAB — PROCALCITONIN: Procalcitonin: 0.34 ng/mL

## 2024-07-16 LAB — CBC
HCT: 39 % (ref 39.0–52.0)
Hemoglobin: 13.5 g/dL (ref 13.0–17.0)
MCH: 29.9 pg (ref 26.0–34.0)
MCHC: 34.6 g/dL (ref 30.0–36.0)
MCV: 86.5 fL (ref 80.0–100.0)
Platelets: 325 K/uL (ref 150–400)
RBC: 4.51 MIL/uL (ref 4.22–5.81)
RDW: 13.2 % (ref 11.5–15.5)
WBC: 13.7 K/uL — ABNORMAL HIGH (ref 4.0–10.5)
nRBC: 0 % (ref 0.0–0.2)

## 2024-07-16 LAB — PREALBUMIN: Prealbumin: 7 mg/dL — ABNORMAL LOW (ref 18–38)

## 2024-07-16 LAB — COMPREHENSIVE METABOLIC PANEL WITH GFR
ALT: 59 U/L — ABNORMAL HIGH (ref 0–44)
AST: 134 U/L — ABNORMAL HIGH (ref 15–41)
Albumin: 3.2 g/dL — ABNORMAL LOW (ref 3.5–5.0)
Alkaline Phosphatase: 89 U/L (ref 38–126)
Anion gap: 10 (ref 5–15)
BUN: 16 mg/dL (ref 8–23)
CO2: 23 mmol/L (ref 22–32)
Calcium: 8.7 mg/dL — ABNORMAL LOW (ref 8.9–10.3)
Chloride: 90 mmol/L — ABNORMAL LOW (ref 98–111)
Creatinine, Ser: 0.89 mg/dL (ref 0.61–1.24)
GFR, Estimated: >60 mL/min
Glucose, Bld: 122 mg/dL — ABNORMAL HIGH (ref 70–99)
Potassium: 3.9 mmol/L (ref 3.5–5.1)
Sodium: 124 mmol/L — ABNORMAL LOW (ref 135–145)
Total Bilirubin: 0.6 mg/dL (ref 0.0–1.2)
Total Protein: 6.3 g/dL — ABNORMAL LOW (ref 6.5–8.1)

## 2024-07-16 LAB — MAGNESIUM: Magnesium: 2 mg/dL (ref 1.7–2.4)

## 2024-07-16 LAB — HEPATITIS PANEL, ACUTE
HCV Ab: NONREACTIVE
Hep A IgM: NONREACTIVE
Hep B C IgM: NONREACTIVE
Hepatitis B Surface Ag: NONREACTIVE

## 2024-07-16 LAB — STREP PNEUMONIAE URINARY ANTIGEN: Strep Pneumo Urinary Antigen: NEGATIVE

## 2024-07-16 LAB — SODIUM, URINE, RANDOM: Sodium, Ur: 31 mmol/L

## 2024-07-16 LAB — TSH: TSH: 1.33 u[IU]/mL (ref 0.350–4.500)

## 2024-07-16 LAB — CK
Total CK: 1653 U/L — ABNORMAL HIGH (ref 49–397)
Total CK: 1345 U/L — ABNORMAL HIGH (ref 49–397)

## 2024-07-16 LAB — PHOSPHORUS: Phosphorus: 3.1 mg/dL (ref 2.5–4.6)

## 2024-07-16 LAB — OSMOLALITY, URINE: Osmolality, Ur: 530 mosm/kg (ref 300–900)

## 2024-07-16 LAB — OSMOLALITY: Osmolality: 259 mosm/kg — ABNORMAL LOW (ref 275–295)

## 2024-07-16 LAB — CREATININE, URINE, RANDOM: Creatinine, Urine: 112 mg/dL

## 2024-07-16 MED ORDER — METOPROLOL TARTRATE 25 MG PO TABS
25.0000 mg | ORAL_TABLET | Freq: Two times a day (BID) | ORAL | Status: DC
  Administered 2024-07-16 – 2024-07-18 (×6): 25 mg via ORAL
  Filled 2024-07-16 (×6): qty 1

## 2024-07-16 MED ORDER — ACETAMINOPHEN 650 MG RE SUPP: 650.0000 mg | Freq: Four times a day (QID) | RECTAL | Status: DC | PRN

## 2024-07-16 MED ORDER — SIMVASTATIN 20 MG PO TABS
20.0000 mg | ORAL_TABLET | Freq: Every day | ORAL | Status: DC
  Administered 2024-07-16: 20 mg via ORAL
  Filled 2024-07-16: qty 1

## 2024-07-16 MED ORDER — ACETAMINOPHEN 325 MG PO TABS: 650.0000 mg | ORAL_TABLET | Freq: Four times a day (QID) | ORAL | Status: DC | PRN

## 2024-07-16 MED ORDER — ONDANSETRON HCL 4 MG/2ML IJ SOLN: 4.0000 mg | Freq: Four times a day (QID) | INTRAMUSCULAR | Status: DC | PRN

## 2024-07-16 MED ORDER — GUAIFENESIN ER 600 MG PO TB12
600.0000 mg | ORAL_TABLET | Freq: Two times a day (BID) | ORAL | Status: DC
  Administered 2024-07-16 – 2024-07-18 (×6): 600 mg via ORAL
  Filled 2024-07-16 (×6): qty 1

## 2024-07-16 MED ORDER — ALBUTEROL SULFATE (2.5 MG/3ML) 0.083% IN NEBU: 2.5000 mg | INHALATION_SOLUTION | RESPIRATORY_TRACT | Status: DC | PRN

## 2024-07-16 MED ORDER — SODIUM CHLORIDE 0.9 % IV SOLN: INTRAVENOUS | Status: AC

## 2024-07-16 MED ORDER — ADULT MULTIVITAMIN W/MINERALS CH
1.0000 | ORAL_TABLET | Freq: Every day | ORAL | Status: DC
  Administered 2024-07-16 – 2024-07-18 (×3): 1 via ORAL
  Filled 2024-07-16 (×3): qty 1

## 2024-07-16 MED ORDER — AZITHROMYCIN 250 MG PO TABS
250.0000 mg | ORAL_TABLET | Freq: Every day | ORAL | Status: DC
  Administered 2024-07-16 – 2024-07-18 (×3): 250 mg via ORAL
  Filled 2024-07-16 (×3): qty 1

## 2024-07-16 MED ORDER — ONDANSETRON HCL 4 MG PO TABS: 4.0000 mg | ORAL_TABLET | Freq: Four times a day (QID) | ORAL | Status: DC | PRN

## 2024-07-16 MED ORDER — HYDROCODONE-ACETAMINOPHEN 5-325 MG PO TABS: 1.0000 | ORAL_TABLET | ORAL | Status: DC | PRN

## 2024-07-16 MED ORDER — SODIUM CHLORIDE 0.9 % IV SOLN
2.0000 g | INTRAVENOUS | Status: DC
  Administered 2024-07-16 – 2024-07-18 (×3): 2 g via INTRAVENOUS
  Filled 2024-07-16 (×3): qty 20

## 2024-07-16 NOTE — Progress Notes (Signed)
 No charge note.  Patient seen and examined this morning, admitted overnight, H&P reviewed and I agree with the assessment and plan.  89 year old male with HTN, HLD, renal mass, comes into the hospital with cough, fever for about a week.  He has been having flulike symptoms.  He was seen by his PCP 3 days prior to admission on January 12th, and at that time COVID, flu were negative.  He was started on some amoxicillin , but had abdominal discomfort and was switched to azithromycin .  He has been having significant nausea, vomiting and eventually had repeat blood work done that showed a white count of 19K, sodium of 122 and he was directed to the ED.  Chest x-ray in the ED was concerning for pneumonia, he was placed on antibiotics and admitted to the hospital  Principal problem Community-acquired pneumonia -he presented to the hospital productive cough, chest x-ray infiltrate, symptoms.  He was started on ceftriaxone  and azithromycin , continue.  He is negative for flu, COVID.  He is on room air this morning  Active problems Hypochloremic hyponatremia -he is likely dehydrated from poor p.o. intake.  Sodium improved with fluids, continue for now  Elevated LFTs -possibly in the setting of acute illness, he also has a degree of mild rhabdomyolysis which can contribute.  Right upper quadrant ultrasound unremarkable  History of renal cell carcinoma-initially diagnosed in 2023, status post ablation.  Repeat MRI during this admission without recurrence or any concerning local issues  Hyperlipidemia-statin on hold due to elevated LFTs  Essential hypertension-normotensive, continue metoprolol   Scheduled Meds:  azithromycin   250 mg Oral Daily   guaiFENesin   600 mg Oral BID   metoprolol  tartrate  25 mg Oral BID   Continuous Infusions:  sodium chloride  75 mL/hr at 07/16/24 1023   cefTRIAXone  (ROCEPHIN )  IV 2 g (07/16/24 1023)   PRN Meds:.acetaminophen  **OR** acetaminophen , albuterol ,  HYDROcodone -acetaminophen , ondansetron  **OR** ondansetron  (ZOFRAN ) IV  Akram Kissick M. Trixie, MD, PhD [facility]  Between 7 am - 7 pm you can contact me via Amion (for emergencies) or Securechat (non urgent matters).  I am not available 7 pm - 7 am, please contact night coverage MD/APP via Amion

## 2024-07-16 NOTE — H&P (Signed)
 "    Jesse Fletcher. FMW:[id number] DOB: December 10, 1934 DOA: 07/15/2024     PCP: [name], MD   Outpatient Specialists:   CARDS:  Dr. [name], MD   Patient arrived to ER on 07/15/24 at 1109 Referred by Attending [name], MD   Patient coming from:    home Lives alone,      Chief Complaint:   Chief Complaint  Patient presents with   Abnormal Lab    HPI: Jesse Fletcher. is a 89 y.o. male with medical history significant of HTN, HLD, renal mass, Ascending aorta dilation     Presented with  cough, fever Patient presents with persistent cough for a week headache flulike symptoms was seen by his primary care provider on the 12th COVID and flu was negative diagnosed with acute bronchitis initially was started on amoxicillin  but that has caused some abdominal discomfort and then switched to Z-Pak Patient has been having significant nausea and vomiting had labs done that showed leukocytosis 19.4 and hyponatremia 122 and he was told to go to emergency department Patient presented to [facility] sodium was found to be 120 white blood cell count down to 16.6 Chest x-ray worrisome for pneumonia Patient noted to be hypoxic while sleeping and started on 2 L Of note patient was supposed to undergo MRI for surveillance of treatment kidney tumor Patient at baseline continues to stay very 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 intake And has hardly anything to eat or drink for past 24 hours  Denies significant ETOH intake   Does not smoke     Regarding pertinent Chronic problems:    Hyperlipidemia -  on statins Zocor  (simvastatin )  Lipid Panel     Component Value Date/Time   CHOL 142 05/16/2023 0915   TRIG 71 05/16/2023 0915   HDL 60 05/16/2023 0915   CHOLHDL 2.4 05/16/2023 0915   VLDL 19 01/04/2017 0758   LDLCALC 67 05/16/2023 0915      HTN on lisinopril /hydrochlorothiazide , metoprolol     Increased calcium score 209 in January 2019  - On Aspirin, statin, betablocker, Plavix                 -  followed by cardiology                        Completed left cryoablation for kidney mass June 2023 Follow-up MRI shows no recurrence     While in ER:    CXR - PNA MRI -  Redemonstration of post ablation changes in the left kidney interpolar region. No residual/recurrent viable tumor seen. No metastatic disease identified within the abdomen.    Lab Orders         Blood Culture (routine x 2)         Resp panel by RT-PCR (RSV, Flu A&B, Covid) Anterior Nasal Swab         Respiratory (~20 pathogens) panel by PCR         CBC with Differential         Comprehensive metabolic panel         Lactic acid, plasma         Protime-INR         Pro Brain natriuretic peptide         Urinalysis, w/ Reflex to Culture (  Infection Suspected) -Urine, Clean Catch         Legionella Pneumophila Serogp 1 Ur Ag        CXR -  Patchy airspace opacity at the left base, possibly representing atelectasis or pneumonia.    Following Medications were ordered in ER: Medications  0.9 %  sodium chloride  infusion ( Intravenous New Bag/Given 07/15/24 2359)  azithromycin  (ZITHROMAX ) tablet 500 mg (500 mg Oral Given 07/15/24 1349)  ipratropium-albuterol  (DUONEB) 0.5-2.5 (3) MG/3ML nebulizer solution 3 mL (3 mLs Nebulization Given 07/15/24 1334)  cefTRIAXone  (ROCEPHIN ) 1 g in sodium chloride  0.9 % 100 mL IVPB (0 g Intravenous Stopped 07/15/24 1418)  acetaminophen  (TYLENOL ) tablet 1,000 mg (1,000 mg Oral Given 07/15/24 1817)        ED Triage Vitals  Encounter Vitals Group     BP 07/15/24 1125 96/63     Girls Systolic BP Percentile --      Girls Diastolic BP Percentile --      Boys Systolic BP Percentile --      Boys Diastolic BP Percentile --      Pulse Rate 07/15/24 1125 (!) 111     Resp 07/15/24 1125 16     Temp 07/15/24 1125 98 F (36.7 C)      Temp Source 07/15/24 1125 Oral     SpO2 07/15/24 1125 92 %     Weight 07/15/24 1127 211 lb (95.7 kg)     Height 07/15/24 1127 6' 2 (1.88 m)     Head Circumference --      Peak Flow --      Pain Score 07/15/24 1127 0     Pain Loc --      Pain Education --      Exclude from Growth Chart --   UFJK(75)@     _________________________________________ Significant initial  Findings: Abnormal Labs Reviewed  CBC WITH DIFFERENTIAL/PLATELET - Abnormal; Notable for the following components:      Result Value   WBC 16.6 (*)    Neutro Abs 14.2 (*)    Monocytes Absolute 1.3 (*)    All other components within normal limits  COMPREHENSIVE METABOLIC PANEL WITH GFR - Abnormal; Notable for the following components:   Sodium 120 (*)    Chloride 82 (*)    Glucose, Bld 144 (*)    AST 116 (*)    ALT 53 (*)    All other components within normal limits  PRO BRAIN NATRIURETIC PEPTIDE - Abnormal; Notable for the following components:   Pro Brain Natriuretic Peptide 538.0 (*)    All other components within normal limits  URINALYSIS, W/ REFLEX TO CULTURE (INFECTION SUSPECTED) - Abnormal; Notable for the following components:   Hgb urine dipstick MODERATE (*)    Ketones, ur 15 (*)    Protein, ur 30 (*)    All other components within normal limits        ECG: Ordered Personally reviewed and interpreted by me showing: HR : 111 Rhythm:  Sinus tachycardia with 1st degree A-V block Right superior axis deviation Anterior infarct , age undetermined Abnormal ECG When compared with ECG of 30-Jul-2023 13:58, poor anterior r wave progression QTC 397  ____ This patient meets SIRS Criteria and may be septic.   The recent clinical data is shown below. Vitals:   07/15/24 2230 07/15/24 2300 07/15/24 2357 07/15/24 2359  BP: (!) 145/77 (!) 143/71  (!) 151/79  Pulse: 98 88  (!) 102  Resp: 15 15  20   Temp:  98.6 F (37 C)  TempSrc:    Oral  SpO2: 97% 95%  96%  Weight:   98.2 kg   Height:   6' 2 (1.88  m)      WBC     Component Value Date/Time   WBC 16.6 (H) 07/15/2024 1129   LYMPHSABS 1.0 07/15/2024 1129   MONOABS 1.3 (H) 07/15/2024 1129   EOSABS 0.0 07/15/2024 1129   BASOSABS 0.0 07/15/2024 1129     Lactic Acid, Venous    Component Value Date/Time   LATICACIDVEN 1.4 07/15/2024 1457    Procalcitonin    Ordered      UA   no evidence of UTI   Urine analysis:    Component Value Date/Time   COLORURINE YELLOW 07/15/2024 1415   APPEARANCEUR CLEAR 07/15/2024 1415   APPEARANCEUR Clear 10/17/2021 1417   LABSPEC 1.028 07/15/2024 1415   PHURINE 6.5 07/15/2024 1415   GLUCOSEU NEGATIVE 07/15/2024 1415   HGBUR MODERATE (A) 07/15/2024 1415   BILIRUBINUR NEGATIVE 07/15/2024 1415   BILIRUBINUR Negative 10/17/2021 1417   KETONESUR 15 (A) 07/15/2024 1415   PROTEINUR 30 (A) 07/15/2024 1415   NITRITE NEGATIVE 07/15/2024 1415   LEUKOCYTESUR NEGATIVE 07/15/2024 1415    Results for orders placed or performed during the hospital encounter of 07/15/24  Resp panel by RT-PCR (RSV, Flu A&B, Covid) Anterior Nasal Swab     Status: None   Collection Time: 07/15/24 12:37 PM   Specimen: Anterior Nasal Swab  Result Value Ref Range Status   SARS Coronavirus 2 by RT PCR NEGATIVE NEGATIVE Final         Influenza A by PCR NEGATIVE NEGATIVE Final   Influenza B by PCR NEGATIVE NEGATIVE Final         Resp Syncytial Virus by PCR NEGATIVE NEGATIVE Final        Blood Culture (routine x 2)     Status: None (Preliminary result)   Collection Time: 07/15/24  1:20 PM   Specimen: BLOOD LEFT HAND  Result Value Ref Range Status   Specimen Description   Final    BLOOD LEFT HAND Performed at [facility], [address] [place], [place] [address]    Special Requests   Final    BOTTLES DRAWN AEROBIC AND ANAEROBIC Blood Culture adequate volume Performed at [facility], [address] [place], [place] [address]    Culture PENDING  Incomplete   Report Status PENDING  Incomplete   Respiratory (~20 pathogens) panel by PCR     Status: None   Collection Time: 07/15/24  1:36 PM   Specimen: Anterior Nasal Swab; Respiratory  Result Value Ref Range Status   Adenovirus NOT DETECTED NOT DETECTED Final   Coronavirus 229E NOT DETECTED NOT DETECTED Final    Comment: (NOTE) The Coronavirus on the Respiratory Panel, DOES NOT test for the novel  Coronavirus (2019 nCoV)    Coronavirus HKU1 NOT DETECTED NOT DETECTED Final   Coronavirus NL63 NOT DETECTED NOT DETECTED Final   Coronavirus OC43 NOT DETECTED NOT DETECTED Final   Metapneumovirus NOT DETECTED NOT DETECTED Final   Rhinovirus / Enterovirus NOT DETECTED NOT DETECTED Final   Influenza A NOT DETECTED NOT DETECTED Final   Influenza B NOT DETECTED NOT DETECTED Final   Parainfluenza Virus 1 NOT DETECTED NOT DETECTED Final   Parainfluenza Virus 2 NOT DETECTED NOT DETECTED Final   Parainfluenza Virus 3 NOT DETECTED NOT DETECTED Final   Parainfluenza Virus 4 NOT DETECTED NOT DETECTED Final  Respiratory Syncytial Virus NOT DETECTED NOT DETECTED Final   Bordetella pertussis NOT DETECTED NOT DETECTED Final   Bordetella Parapertussis NOT DETECTED NOT DETECTED Final   Chlamydophila pneumoniae NOT DETECTED NOT DETECTED Final   Mycoplasma pneumoniae NOT DETECTED NOT DETECTED Final    Comment: Performed at [facility], [address], [place], [place] [address]    ABX started Antibiotics Given (last 72 hours)     Date/Time Action Medication Dose Rate   07/15/24 1348 New Bag/Given   cefTRIAXone  (ROCEPHIN ) 1 g in sodium chloride  0.9 % 100 mL IVPB 1 g 200 mL/hr   07/15/24 1349 Given   azithromycin  (ZITHROMAX ) tablet 500 mg 500 mg          __________________________________________________________ Recent Labs  Lab 07/14/24 1050 07/15/24 1129  NA 122* 120*  K 4.5 4.0  CO2 28 25  GLUCOSE 141* 144*  BUN 14 19  CREATININE 0.88 1.12  CALCIUM 9.4 9.7    Cr     Up from baseline see below Lab Results  Component  Value Date   CREATININE 1.12 07/15/2024   CREATININE 0.88 07/14/2024   CREATININE 0.97 05/16/2023    Recent Labs  Lab 07/14/24 1050 07/15/24 1129  AST 53* 116*  ALT 28 53*  ALKPHOS  --  98  BILITOT 0.8 0.6  PROT 6.7 7.0  ALBUMIN  --  3.6   Lab Results  Component Value Date   CALCIUM 9.7 07/15/2024    Plt: Lab Results  Component Value Date   PLT 395 07/15/2024       Recent Labs  Lab 07/14/24 1050 07/15/24 1129  WBC 19.4* 16.6*  NEUTROABS 16,781* 14.2*  HGB 15.2 14.4  HCT 45.1 40.9  MCV 88.6 84.7  PLT 392 395    HG/HCT  stable,       Component Value Date/Time   HGB 14.4 07/15/2024 1129   HCT 40.9 07/15/2024 1129   MCV 84.7 07/15/2024 1129    _______________________________________________ Hospitalist was called for admission for   Pneumonia of left lower lobe due to infectious organism    Hyponatremia   The following Work up has been ordered so far:  Orders Placed This Encounter  Procedures   Blood Culture (routine x 2)   Resp panel by RT-PCR (RSV, Flu A&B, Covid) Anterior Nasal Swab   Respiratory (~20 pathogens) panel by PCR   DG Chest 2 View   CBC with Differential   Comprehensive metabolic panel   Lactic acid, plasma   Protime-INR   Pro Brain natriuretic peptide   Urinalysis, w/ Reflex to Culture (Infection Suspected) -Urine, Clean Catch   Legionella Pneumophila Serogp 1 Ur Ag   Nursing communication   Document height and weight   Assess and Document Glasgow Coma Scale   Document vital signs within 1-hour of fluid bolus completion.  Notify provider of abnormal vital signs despite fluid resuscitation.   Refer to Sidebar Report: Sepsis Bundle ED/IP   Notify provider for difficulties obtaining IV access   Initiate Carrier Fluid Protocol   Cardiac Monitoring - Continuous Indefinite   Consult to hospitalist   Droplet precaution   Oxygen therapy Mode or (Route): Nasal cannula   ED EKG   EKG   EKG   Insert peripheral IV X 1   Admit to  Inpatient (patient's expected length of stay will be greater than 2 midnights or inpatient only procedure)     OTHER Significant initial  Findings:  labs showing:  DM  labs:  HbA1C: No results for input(s): HGBA1C in the last 8760 hours.     CBG (last 3)  No results for input(s): GLUCAP in the last 72 hours.        Cultures:    Component Value Date/Time   SDES  07/15/2024 1320    BLOOD LEFT HAND Performed at [facility], [address] [place], [place] [address]    [facility]  07/15/2024 1320    BOTTLES DRAWN AEROBIC AND ANAEROBIC Blood Culture adequate volume Performed at [facility], [address], [place], [place] [address]    CULT PENDING 07/15/2024 1320   REPTSTATUS PENDING 07/15/2024 1320     Radiological Exams on Admission: DG Chest 2 View Result Date: 07/15/2024 EXAM: PA AND LATERAL (2) VIEW(S) XRAY OF THE CHEST 07/15/2024 12:06:54 PM COMPARISON: PA and lateral radiographs of the chest dated 01/23/2018. CLINICAL HISTORY: FINDINGS: LUNGS AND PLEURA: Patchy airspace opacity at left base, possibly representing atelectasis or pneumonia. No pleural effusion. No pneumothorax. HEART AND MEDIASTINUM: Aortic atherosclerosis. No acute abnormality of the cardiac and mediastinal silhouettes. BONES AND SOFT TISSUES: No acute osseous abnormality. IMPRESSION: 1. Patchy airspace opacity at the left base, possibly representing atelectasis or pneumonia. 2. Aortic atherosclerosis. Electronically signed by: Evalene Coho MD 07/15/2024 12:43 PM EST RP Workstation: [id number]   MR ABDOMEN WWO CONTRAST Result Date: 07/15/2024 CLINICAL DATA:  Left renal mass. EXAM: MRI ABDOMEN WITHOUT AND WITH CONTRAST TECHNIQUE: Multiplanar multisequence MR imaging of the abdomen was performed both before and after the administration of intravenous contrast. CONTRAST:  9mL GADAVIST  GADOBUTROL  1 MMOL/ML IV SOLN COMPARISON:  MRI abdomen from 06/12/2023. FINDINGS: The  technologist noted Patient wears hearing aids. Patient states he is unable to hear the breathing instructions even after adjusting volume. Best images obtained Lower chest: There are heterogeneous opacities in the bilateral lower lobes, left more than right, concerning for pneumonitis. Correlate clinically. No pleural effusion. Top-normal heart size. No pericardial effusion. Hepatobiliary: The liver is mildly enlarged in size measuring up to 16.5 cm in length. Noncirrhotic configuration. No suspicious liver lesion. No intrahepatic or extrahepatic bile duct dilatation. No choledocholithiasis. Unremarkable gallbladder. Pancreas: No mass, inflammatory changes or other parenchymal abnormality identified. No main pancreatic duct dilation. Spleen: Mildly enlarged measuring upto 6.3 x 12.9 cm orthogonally on coronal plane. No focal mass. Adrenals/Urinary Tract: Unremarkable adrenal glands. No hydroureteronephrosis. There are several scattered simple cysts throughout bilateral kidneys with largest arising from the right kidney lower pole, anteriorly measuring up to 1.2 x 1.5 cm. Redemonstration of post ablation cavity in the left kidney interpolar region, laterally measuring approximately 2.0 x 2.8 cm. The cavity is Iso to slightly hyperintense on T1 weighted images, hypointense on T2 weighted images it does not exhibit significant contrast enhancement on subtraction/postcontrast images. Findings favor satisfactory response of cryoablation. No residual/recurrent viable tumor seen. Stomach/Bowel: Visualized portions within the abdomen are unremarkable. No disproportionate dilation of bowel loops. Vascular/Lymphatic: No pathologically enlarged lymph nodes identified. No abdominal aortic aneurysm demonstrated. No ascites. Other:  None. Musculoskeletal: No suspicious bone lesions identified. IMPRESSION: 1. Redemonstration of post ablation changes in the left kidney interpolar region. No residual/recurrent viable tumor seen.  No metastatic disease identified within the abdomen. 2. There are heterogeneous opacities in the bilateral lower lobes, left more than right, concerning for pneumonitis. Correlate clinically. 3. Otherwise essentially unremarkable exam, as described above. Electronically Signed   By: Ree Molt M.D.   On: 07/15/2024 11:27   _______________________________________________________________________________________________________ Latest  Blood  pressure (!) 151/79, pulse (!) 102, temperature 98.6 F (37 C), temperature source Oral, resp. rate 20, height 6' 2 (1.88 m), weight 98.2 kg, SpO2 96%.   Vitals  labs and radiology finding personally reviewed  Review of Systems:    Pertinent positives include:  Fevers, chills, fatigue,  Constitutional:  No weight loss, night sweats,  weight loss  HEENT:  No headaches, Difficulty swallowing,Tooth/dental problems,Sore throat,  No sneezing, itching, ear ache, nasal congestion, post nasal drip,  Cardio-vascular:  No chest pain, Orthopnea, PND, anasarca, dizziness, palpitations.no Bilateral lower extremity swelling  GI:  No heartburn, indigestion, abdominal pain, nausea, vomiting, diarrhea, change in bowel habits, loss of appetite, melena, blood in stool, hematemesis Resp:  no shortness of breath at rest. No dyspnea on exertion, No excess mucus, no productive cough, No non-productive cough, No coughing up of blood.No change in color of mucus.No wheezing. Skin:  no rash or lesions. No jaundice GU:  no dysuria, change in color of urine, no urgency or frequency. No straining to urinate.  No flank pain.  Musculoskeletal:  No joint pain or no joint swelling. No decreased range of motion. No back pain.  Psych:  No change in mood or affect. No depression or anxiety. No memory loss.  Neuro: no localizing neurological complaints, no tingling, no weakness, no double vision, no gait abnormality, no slurred speech, no confusion  All systems reviewed and apart  from HOPI all are negative _______________________________________________________________________________________________ Past Medical History:   Past Medical History:  Diagnosis Date   Arthritis    Ascending aorta dilation    Blood in stool    Hemorrhoids    History of kidney stones    Hyperlipidemia    Hypertension    Pre-diabetes    Rectal fissure    Rectal pain    Skin cancer       Past Surgical History:  Procedure Laterality Date   HERNIA REPAIR  2000 (approx)   IR RADIOLOGIST EVAL & MGMT  11/09/2021   IR RADIOLOGIST EVAL & MGMT  01/15/2022   lower back surgery  05/1995, 05/1998   RADIOLOGY WITH ANESTHESIA Left 12/20/2021   Procedure: CT WITH ANESTHESIA CRYOABLATION;  Surgeon: Philip Cornet, MD;  Location: [facility];  Service: Anesthesiology;  Laterality: Left;   SHOULDER SURGERY  1997   both    Social History:  Ambulatory   independently      reports that he has never smoked. He has never used smokeless tobacco. He reports that he does not drink alcohol and does not use drugs.   Family History:   No family history on file. ______________________________________________________________________________________________ Allergies: Allergies[1]   Prior to Admission medications  Medication Sig Start Date End Date Taking? Authorizing Provider  amoxicillin  (AMOXIL ) 875 MG tablet Take 1 tablet (875 mg total) by mouth 2 (two) times daily for 10 days. 07/13/24 07/23/24  Aletha Bene, MD  aspirin EC 81 MG tablet Take 81 mg by mouth daily. Swallow whole.    [provider]  azithromycin  (ZITHROMAX  Z-PAK) 250 MG tablet Take 2 tablets (500 mg) PO today, then 1 tablet (250 mg) PO daily x4 days. 07/14/24   Aletha Bene, MD  benzonatate  (TESSALON ) 100 MG capsule Take 1 capsule (100 mg total) by mouth 3 (three) times daily as needed. 07/13/24   Aletha Bene, MD  lisinopril -hydrochlorothiazide  (ZESTORETIC ) 10-12.5 MG tablet Take 1 tablet by mouth twice daily 05/15/24    [name], MD  metoprolol  tartrate (LOPRESSOR ) 25 MG tablet Take 1 tablet  by mouth twice daily 07/08/24   [name], MD  Multiple Vitamins-Minerals (CENTRUM SILVER ADULT 50+ PO) Take 1 tablet by mouth daily.    [provider]  neomycin -polymyxin-hydrocortisone (CORTISPORIN) OTIC solution Place 3 drops into the left ear 4 (four) times daily. 08/21/22   [name], MD  ondansetron  (ZOFRAN ) 4 MG tablet Take 1 tablet (4 mg total) by mouth every 8 (eight) hours as needed for nausea or vomiting. 07/14/24   Aletha Bene, MD  simvastatin  (ZOCOR ) 20 MG tablet TAKE 1 TABLET BY MOUTH AT BEDTIME 07/08/24   [name], MD    ___________________________________________________________________________________________________ Physical Exam:    07/15/2024   11:59 PM 07/15/2024   11:57 PM 07/15/2024   11:00 PM  Vitals with BMI  Height  6' 2   Weight  216 lbs 8 oz   BMI  27.78   Systolic 151  143  Diastolic 79  71  Pulse 102  88    1. General:  in No  Acute distress   Chronically ill   -appearing 2. Psychological: Alert and   Oriented 3. Head/ENT:    Dry Mucous Membranes                          Head Non traumatic, neck supple                         poor Dentition 4. SKIN:  decreased Skin turgor,  Skin clean Dry and intact no rash    5. Heart: Regular rate and rhythm no  Murmur, no Rub or gallop 6. Lungs:  no wheezes or crackles   7. Abdomen: Soft,  non-tender, Non distended bowel sounds present 8. Lower extremities: no clubbing, cyanosis, no  edema 9. Neurologically Grossly intact, moving all 4 extremities equally  10. MSK: Normal range of motion    Chart has been reviewed  ______________________________________________________________________________________________  Assessment/Plan 89 y.o. male with medical history significant of HTN, HLD, renal mass, Ascending aorta dilation   Admitted for   Pneumonia of left lower lobe due to infectious organism,     Hyponatremia     Present on Admission:  CAP (community acquired pneumonia)  Hyperlipidemia  Hypertension  Sepsis (HCC)  Acute respiratory failure with hypoxia (HCC)  Hyponatremia  Transaminitis     CAP (community acquired pneumonia)  - -Patient presenting with  productive cough,   hypoxia  , and infiltrate in   lower lobe on chest x-ray -Infiltrate on CXR and 2-3 characteristics (fever, leukocytosis, purulent sputum) are consistent with pneumonia. -This appears to be most likely community-acquired pneumonia.      will admit for treatment of CAP will start on appropriate antibiotic coverage. - Rocephin /azithromycin    Obtain:  sputum cultures,          influenza serologies negative                  COVID PCR negative                     blood cultures and sputum cultures ordered                   strep pneumo UA antigen,                 check for Legionella antigen.                Provide oxygen  as needed.    Hyperlipidemia Hold Zocor  20 mg a day  Hypertension No noted bump in creatinine hold lisinopril  and hydrochlorothiazide  if blood pressure allows would continue metoprolol  25 mg twice daily  Sepsis (HCC)  -SIRS criteria met with  elevated white blood cell count,       Component Value Date/Time   WBC 16.6 (H) 07/15/2024 1129   LYMPHSABS 1.0 07/15/2024 1129     tachycardia   ,   fever   RR >20 Today's Vitals   07/15/24 2300 07/15/24 2357 07/15/24 2359 07/16/24 0000  BP: (!) 143/71  (!) 151/79   Pulse: 88  (!) 102   Resp: 15  20   Temp:   98.6 F (37 C)   TempSrc:   Oral   SpO2: 95%  96%   Weight:  98.2 kg    Height:  6' 2 (1.88 m)    PainSc:    0-No pain    The recent clinical data is shown below. Vitals:   07/15/24 2230 07/15/24 2300 07/15/24 2357 07/15/24 2359  BP: (!) 145/77 (!) 143/71  (!) 151/79  Pulse: 98 88  (!) 102  Resp: 15 15  20   Temp:    98.6 F (37 C)  TempSrc:    Oral  SpO2: 97% 95%  96%  Weight:   98.2 kg   Height:   6' 2 (1.88 m)        -Most likely source being:  pulmonary     - Obtain serial lactic acid and procalcitonin level.  - Initiated IV antibiotics in ER: Antibiotics Given (last 72 hours)     Date/Time Action Medication Dose Rate   07/15/24 1348 New Bag/Given   cefTRIAXone  (ROCEPHIN ) 1 g in sodium chloride  0.9 % 100 mL IVPB 1 g 200 mL/hr   07/15/24 1349 Given   azithromycin  (ZITHROMAX ) tablet 500 mg 500 mg        Will continue  on : rocephin  azithro   - await results of blood and urine culture  - Rehydrate aggressively  Intravenous fluids were administered,    12:22 AM   Renal lesion MRi showing no recurrence   Acute respiratory failure with hypoxia (HCC)  this patient has acute respiratory failure with Hypoxia   as documented by the presence of following: O2 saturatio< 90% on RA  Likely due to:   Pneumonia,   Provide O2 therapy and titrate as needed  Continuous pulse ox   check Pulse ox with ambulation prior to discharge   may need  TC consult for home O2 set up    flutter valve ordered   Hyponatremia Patient arrives from [facility] on IV fluids at normal saline at 150 mL an hour  - order urine electrolytes, Gently rehydrate with normal saline   Obtain BMP now -Frequent labs Check TSH Hold hydrochlorothiazide    Transaminitis Possibly secondary to dehydration will rehydrate check CK given persistent elevation order right upper quad ultrasound Hold statin for tonight   Other plan as per orders.  DVT prophylaxis:  SCD     Code Status:    Code Status: Not on file FULL CODE  as per patient   I had personally discussed CODE STATUS with patient and family*  ACP   none     Family Communication:   Family not at  Bedside    Diet heart healthy   Disposition Plan:      To home once workup is complete and patient is stable  Following barriers for discharge:                                                       Electrolytes corrected                                                           Afebrile, white count improving able to transition to PO antibiotics                                                        Work of breathing improves       Consult Orders  (From admission, onward)           Start     Ordered   07/15/24 1457  Consult to hospitalist  Florence Bare at [facility] for hosp consult 15:16-TC  Once       Provider:  (Not yet assigned)  Question Answer Comment  Place call to: [facility]   Reason for Consult Admit      07/15/24 1456                               Would benefit from PT/OT eval prior to DC  Ordered                 Consults called:    NONE   Admission status:  ED Disposition     ED Disposition  Admit   Condition  --   Comment  Hospital Area[facility] [[id number]]  Level of Care: Telemetry [5]  Admit to tele based on following criteria: Monitor QTC interval  May admit patient to [facility] or [facility] if equivalent level of care is available:: Yes  Interfacility transfer: Yes  Diagnosis: Pneumonia [227785]  Admitting Physician: [name] [[id number]]  Attending Physician: [name] [[id number]]  Certification:: I certify this patient will need inpatient services for at least 2 midnights  Expected Medical Readiness: 07/17/2024           inpatient     I Expect 2 midnight stay secondary to severity of patient's current illness need for inpatient interventions justified by the following:  hemodynamic instability despite optimal treatment (tachycardia  hypoxia,  )   Severe lab/radiological/exam abnormalities including:    Pneumonia of left lower lobe due to infectious organism    Hyponatremia    and extensive comorbidities including: CAD  CKD   That are currently affecting medical management.   I expect  patient to be hospitalized for 2 midnights requiring inpatient medical care.  Patient is at high risk for adverse outcome (such as loss of life or disability) if not  treated.  Indication for inpatient stay as follows:   New or worsening hypoxia    Need for IV antibiotics, IV fluids,,   Frequent labs    Level of care     tele  For 24H  Marielle Mantione 07/16/2024, 2:23 AM    [facility]     after 2 AM please page floor coverage   If 7AM-7PM, please contact the day team taking care of the patient using [email or web address]        [1]  Allergies Allergen Reactions   Doxycycline      GI upset    "

## 2024-07-16 NOTE — Assessment & Plan Note (Signed)
" -  SIRS criteria met with  elevated white blood cell count,       Component Value Date/Time   WBC 16.6 (H) 07/15/2024 1129   LYMPHSABS 1.0 07/15/2024 1129     tachycardia   ,   fever   RR >20 Today's Vitals   07/15/24 2300 07/15/24 2357 07/15/24 2359 07/16/24 0000  BP: (!) 143/71  (!) 151/79   Pulse: 88  (!) 102   Resp: 15  20   Temp:   98.6 F (37 C)   TempSrc:   Oral   SpO2: 95%  96%   Weight:  98.2 kg    Height:  6' 2 (1.88 m)    PainSc:    0-No pain    The recent clinical data is shown below. Vitals:   07/15/24 2230 07/15/24 2300 07/15/24 2357 07/15/24 2359  BP: (!) 145/77 (!) 143/71  (!) 151/79  Pulse: 98 88  (!) 102  Resp: 15 15  20   Temp:    98.6 F (37 C)  TempSrc:    Oral  SpO2: 97% 95%  96%  Weight:   98.2 kg   Height:   6' 2 (1.88 m)       -Most likely source being:  pulmonary     - Obtain serial lactic acid and procalcitonin level.  - Initiated IV antibiotics in ER: Antibiotics Given (last 72 hours)     Date/Time Action Medication Dose Rate   07/15/24 1348 New Bag/Given   cefTRIAXone  (ROCEPHIN ) 1 g in sodium chloride  0.9 % 100 mL IVPB 1 g 200 mL/hr   07/15/24 1349 Given   azithromycin  (ZITHROMAX ) tablet 500 mg 500 mg        Will continue  on : rocephin  azithro   - await results of blood and urine culture  - Rehydrate aggressively  Intravenous fluids were administered,    12:22 AM  "

## 2024-07-16 NOTE — Assessment & Plan Note (Signed)
 No noted bump in creatinine hold lisinopril  and hydrochlorothiazide  if blood pressure allows would continue metoprolol  25 mg twice daily

## 2024-07-16 NOTE — Plan of Care (Signed)
  Problem: Activity: Goal: Risk for activity intolerance will decrease Outcome: Progressing   Problem: Coping: Goal: Level of anxiety will decrease Outcome: Progressing   Problem: Elimination: Goal: Will not experience complications related to urinary retention Outcome: Progressing   Problem: Safety: Goal: Ability to remain free from injury will improve Outcome: Progressing   

## 2024-07-16 NOTE — Assessment & Plan Note (Signed)
 [facility] IV fluids at normal saline at 150 mL an hour  - order urine electrolytes, Gently rehydrate with normal saline   Obtain BMP now -Frequent labs Check TSH Hold hydrochlorothiazide 

## 2024-07-16 NOTE — Assessment & Plan Note (Signed)
 Possibly secondary to dehydration will rehydrate check CK given persistent elevation order right upper quad ultrasound Hold statin for tonight

## 2024-07-16 NOTE — Assessment & Plan Note (Signed)
 this patient has acute respiratory failure with Hypoxia  as documented by the presence of following: O2 saturatio< 90% on RA   Likely due to:   Pneumonia,   Provide O2 therapy and titrate as needed  Continuous pulse ox   check Pulse ox with ambulation prior to discharge   may need  TC consult for home O2 set up    flutter valve ordered

## 2024-07-16 NOTE — Progress Notes (Signed)
 Initial Nutrition Assessment  INTERVENTION:   -Multivitamin with minerals daily  -Encourage PO intakes  NUTRITION DIAGNOSIS:   Increased nutrient needs related to acute illness as evidenced by estimated needs.  GOAL:   Patient will meet greater than or equal to 90% of their needs  MONITOR:   PO intake  REASON FOR ASSESSMENT:   Consult Assessment of nutrition requirement/status  ASSESSMENT:   89 year old male with HTN, HLD, renal mass, comes into the hospital with cough, fever for about a week. Admitted for pneumonia.  Patient in room, visitor at bedside. Pt HOH, did not have his hearing aids in. Pt reports good appetite typically, eats a lot per visitor. He started to eat less a week ago when he became sick with flu like symptoms. Pt is very active and works outside a lot.  Denies any issues with swallowing, chewing or taste.  Pt feeling much better today. Ate cheerios with milk this morning. Lunch is on it's way. Pt feels hungry. Does not want protein shakes at this time. Takes a daily MVI at home so will resume that here.  Per patient UBW ~212 lbs. Current weight: 216 lbs.  Medications reviewed.  Labs reviewed: Low sodium   NUTRITION - FOCUSED PHYSICAL EXAM:  Flowsheet Row Most Recent Value  Orbital Region Moderate depletion  Upper Arm Region No depletion  Thoracic and Lumbar Region No depletion  Buccal Region No depletion  Temple Region No depletion  Clavicle Bone Region No depletion  Clavicle and Acromion Bone Region No depletion  Scapular Bone Region No depletion  Dorsal Hand No depletion  Patellar Region No depletion  Anterior Thigh Region No depletion  Posterior Calf Region No depletion  Edema (RD Assessment) None  Hair Reviewed  Eyes Reviewed  Mouth Reviewed  Skin Reviewed  Nails Reviewed    Diet Order:   Diet Order             Diet Heart Room service appropriate? Yes; Fluid consistency: Thin  Diet effective now                    EDUCATION NEEDS:   No education needs have been identified at this time  Skin:  Skin Assessment: Reviewed RN Assessment  Last BM:  PTA  Height:   Ht Readings from Last 1 Encounters:  07/15/24 6' 2 (1.88 m)    Weight:   Wt Readings from Last 1 Encounters:  07/15/24 98.2 kg   BMI:  Body mass index is 27.8 kg/m.  Estimated Nutritional Needs:   Kcal:  2100-2300  Protein:  100-110g  Fluid:  2.1L/day   Morna Lee, MS, RD, LDN Inpatient Clinical Dietitian Contact via Secure chat

## 2024-07-16 NOTE — Assessment & Plan Note (Signed)
 MRi showing no recurrence

## 2024-07-16 NOTE — Assessment & Plan Note (Signed)
- -  Patient presenting with  productive cough,    hypoxia  , and infiltrate in   lower lobe on chest x-ray -Infiltrate on CXR and 2-3 characteristics (fever, leukocytosis, purulent sputum) are consistent with pneumonia. -This appears to be most likely community-acquired pneumonia.    Marland Kitchen   will admit for treatment of CAP will start on appropriate antibiotic coverage. - Rocephin/azithromycin   Obtain:  sputum cultures,                                  influenza serologies negative                  COVID PCR negative                     blood cultures and sputum cultures ordered                   strep pneumo UA antigen,                    check for Legionella antigen.                Provide oxygen as needed.

## 2024-07-16 NOTE — Plan of Care (Signed)

## 2024-07-16 NOTE — Progress Notes (Signed)
 Notified by lab sputum sample is unable to be used due to sample contamination. Will attempt to recollect.

## 2024-07-16 NOTE — Assessment & Plan Note (Addendum)
 Hold Zocor  20 mg a day

## 2024-07-17 DIAGNOSIS — J189 Pneumonia, unspecified organism: Secondary | ICD-10-CM | POA: Diagnosis not present

## 2024-07-17 LAB — COMPREHENSIVE METABOLIC PANEL WITH GFR
ALT: 70 U/L — ABNORMAL HIGH (ref 0–44)
AST: 123 U/L — ABNORMAL HIGH (ref 15–41)
Albumin: 3.1 g/dL — ABNORMAL LOW (ref 3.5–5.0)
Alkaline Phosphatase: 95 U/L (ref 38–126)
Anion gap: 11 (ref 5–15)
BUN: 11 mg/dL (ref 8–23)
CO2: 23 mmol/L (ref 22–32)
Calcium: 9 mg/dL (ref 8.9–10.3)
Chloride: 94 mmol/L — ABNORMAL LOW (ref 98–111)
Creatinine, Ser: 0.76 mg/dL (ref 0.61–1.24)
GFR, Estimated: >60 mL/min
Glucose, Bld: 131 mg/dL — ABNORMAL HIGH (ref 70–99)
Potassium: 3.3 mmol/L — ABNORMAL LOW (ref 3.5–5.1)
Sodium: 129 mmol/L — ABNORMAL LOW (ref 135–145)
Total Bilirubin: 0.5 mg/dL (ref 0.0–1.2)
Total Protein: 6.4 g/dL — ABNORMAL LOW (ref 6.5–8.1)

## 2024-07-17 LAB — CBC
HCT: 40.7 % (ref 39.0–52.0)
Hemoglobin: 14.3 g/dL (ref 13.0–17.0)
MCH: 30.2 pg (ref 26.0–34.0)
MCHC: 35.1 g/dL (ref 30.0–36.0)
MCV: 85.9 fL (ref 80.0–100.0)
Platelets: 349 K/uL (ref 150–400)
RBC: 4.74 MIL/uL (ref 4.22–5.81)
RDW: 13 % (ref 11.5–15.5)
WBC: 8.2 K/uL (ref 4.0–10.5)
nRBC: 0 % (ref 0.0–0.2)

## 2024-07-17 LAB — MAGNESIUM: Magnesium: 2.2 mg/dL (ref 1.7–2.4)

## 2024-07-17 MED ORDER — PHENOL 1.4 % MT LIQD
1.0000 | OROMUCOSAL | Status: DC | PRN
  Administered 2024-07-17: 1 via OROMUCOSAL
  Filled 2024-07-17: qty 177

## 2024-07-17 MED ORDER — GUAIFENESIN-DM 100-10 MG/5ML PO SYRP
5.0000 mL | ORAL_SOLUTION | ORAL | Status: DC | PRN
  Administered 2024-07-17 (×2): 5 mL via ORAL
  Filled 2024-07-17 (×2): qty 5

## 2024-07-17 MED ORDER — POTASSIUM CHLORIDE CRYS ER 20 MEQ PO TBCR
40.0000 meq | EXTENDED_RELEASE_TABLET | Freq: Once | ORAL | Status: AC
  Administered 2024-07-17: 40 meq via ORAL
  Filled 2024-07-17: qty 2

## 2024-07-17 NOTE — Progress Notes (Signed)
 Mobility Specialist Progress Note:   07/17/24 1334  Mobility  Activity Ambulated independently  Level of Assistance Standby assist, set-up cues, supervision of patient - no hands on  Assistive Device None  Distance Ambulated (ft) 300 ft  Activity Response Tolerated well  Mobility Referral Yes  Mobility visit 1 Mobility  Mobility Specialist Start Time (ACUTE ONLY) 1246  Mobility Specialist Stop Time (ACUTE ONLY) 1255  Mobility Specialist Time Calculation (min) (ACUTE ONLY) 9 min   Pt was received in recliner and eager to ambulate. Pt stated slight dizziness but quickly subsided. Returned to recliner with all needs met. Call bell in reach.  [employer] - Mobility Specialist

## 2024-07-17 NOTE — Progress Notes (Signed)
 " PROGRESS NOTE  Jesse Fletcher Jesse Fletcher. FMW:[id number] DOB: 1935/01/26 DOA: 07/15/2024 PCP: [name], MD   LOS: 2 days   Brief Narrative / Interim history: 89 year old male with HTN, HLD, renal mass, comes into the hospital with cough, fever for about a week.  He has been having flulike symptoms.  He was seen by his PCP 3 days prior to admission on January 12th, and at that time COVID, flu were negative.  He was started on some amoxicillin , but had abdominal discomfort and was switched to azithromycin .  He has been having significant nausea, vomiting and eventually had repeat blood work done that showed a white count of 19K, sodium of 122 and he was directed to the ED.  Chest x-ray in the ED was concerning for pneumonia, he was placed on antibiotics and admitted to the hospital   Subjective / 24h Interval events: He continues to complain of a cough, his main complaint though is that he has not been able to get up and walk.  Has been having a lot of mucus and sputum production  Assesement and Plan: Principal problem Community-acquired pneumonia -he presented to the hospital productive cough, chest x-ray infiltrate, symptoms.  He was started on ceftriaxone  and azithromycin , continue.  He is negative for flu, COVID.  He is on room air this morning - Have asked the patient and the RN to encourage more ambulation, he needs to walk in the hallway -Continue supportive care   Active problems Hypochloremic hyponatremia -he is likely dehydrated from poor p.o. intake.  Sodium improved, 129 today.  Will hold off further fluids and allow p.o. intake.   Elevated LFTs -possibly in the setting of acute illness, he also has a degree of mild rhabdomyolysis which can contribute.  Right upper quadrant ultrasound unremarkable, acute hepatitis panel negative   History of renal cell carcinoma-initially diagnosed in 2023, status post ablation.  Repeat MRI during this admission without recurrence or any concerning  local issues   Hyperlipidemia-statin on hold due to elevated LFTs   Essential hypertension-blood pressure overall acceptable, continue home metoprolol   Scheduled Meds:  azithromycin   250 mg Oral Daily   guaiFENesin   600 mg Oral BID   metoprolol  tartrate  25 mg Oral BID   multivitamin with minerals  1 tablet Oral Daily   Continuous Infusions:  cefTRIAXone  (ROCEPHIN )  IV 2 g (07/17/24 0902)   PRN Meds:.acetaminophen  **OR** acetaminophen , albuterol , guaiFENesin -dextromethorphan , HYDROcodone -acetaminophen , ondansetron  **OR** ondansetron  (ZOFRAN ) IV, phenol  Current Outpatient Medications  Medication Instructions   amoxicillin  (AMOXIL ) 875 mg, Oral, 2 times daily   aspirin EC 81 mg, Oral, Daily, Swallow whole.   azithromycin  (ZITHROMAX  Z-PAK) 250 MG tablet Take 2 tablets (500 mg) PO today, then 1 tablet (250 mg) PO daily x4 days.   benzonatate  (TESSALON ) 100 mg, Oral, 3 times daily PRN   lisinopril -hydrochlorothiazide  (ZESTORETIC ) 10-12.5 MG tablet 1 tablet, Oral, 2 times daily   metoprolol  tartrate (LOPRESSOR ) 25 mg, Oral, 2 times daily   Multiple Vitamins-Minerals (CENTRUM SILVER ADULT 50+ PO) 1 tablet, Oral, Daily   neomycin -polymyxin-hydrocortisone  (CORTISPORIN) OTIC solution 3 drops, Left EAR, 4 times daily   ondansetron  (ZOFRAN ) 4 mg, Oral, Every 8 hours PRN   simvastatin  (ZOCOR ) 20 mg, Oral, Daily at bedtime    Diet Orders (From admission, onward)     Start     Ordered   07/16/24 0020  Diet Heart Room service appropriate? Yes; Fluid consistency: Thin  Diet effective now       Question  Answer Comment  Room service appropriate? Yes   Fluid consistency: Thin      07/16/24 0020            DVT prophylaxis: SCDs Start: 07/16/24 0020   Lab Results  Component Value Date   PLT 349 07/17/2024      Code Status: Full Code  Family Communication: No family at bedside  Status is: Inpatient Remains inpatient appropriate because: Severity of illness  Level of care:  Telemetry  Consultants:  none  Objective: Vitals:   07/16/24 1426 07/16/24 2210 07/16/24 2219 07/17/24 0510  BP: 127/69 136/71 136/71 (!) 160/78  Pulse: 83 (!) 107 (!) 105 82  Resp: 16  20 (!) 22  Temp: 97.6 F (36.4 C)  98 F (36.7 C) 97.7 F (36.5 C)  TempSrc: Oral  Oral   SpO2: 95%  94% 92%  Weight:      Height:        Intake/Output Summary (Last 24 hours) at 07/17/2024 1110 Last data filed at 07/17/2024 0755 Gross per 24 hour  Intake 1675 ml  Output 1200 ml  Net 475 ml   Wt Readings from Last 3 Encounters:  07/15/24 98.2 kg  07/14/24 95.8 kg  07/13/24 96 kg    Examination:  Constitutional: NAD Eyes: no scleral icterus ENMT: Mucous membranes are moist.  Neck: normal, supple Respiratory: No wheezing, no crackles.  Normal respiratory effort Cardiovascular: Regular rate and rhythm, no murmurs / rubs / gallops. No LE edema.  Abdomen: non distended, no tenderness. Bowel sounds positive.  Musculoskeletal: no clubbing / cyanosis.   Data Reviewed: I have independently reviewed following labs and imaging studies   CBC Recent Labs  Lab 07/14/24 1050 07/15/24 1129 07/16/24 0100 07/17/24 0752  WBC 19.4* 16.6* 13.7* 8.2  HGB 15.2 14.4 13.5 14.3  HCT 45.1 40.9 39.0 40.7  PLT 392 395 325 349  MCV 88.6 84.7 86.5 85.9  MCH 29.9 29.8 29.9 30.2  MCHC 33.7 35.2 34.6 35.1  RDW 12.9 13.1 13.2 13.0  LYMPHSABS  --  1.0  --   --   MONOABS  --  1.3*  --   --   EOSABS 58 0.0  --   --   BASOSABS 39 0.0  --   --     Recent Labs  Lab 07/14/24 1050 07/15/24 1129 07/15/24 1236 07/15/24 1457 07/16/24 0100 07/16/24 0101 07/17/24 0752  NA 122* 120*  --   --  124*  --  129*  K 4.5 4.0  --   --  3.9  --  3.3*  CL 84* 82*  --   --  90*  --  94*  CO2 28 25  --   --  23  --  23  GLUCOSE 141* 144*  --   --  122*  --  131*  BUN 14 19  --   --  16  --  11  CREATININE 0.88 1.12  --   --  0.89  --  0.76  CALCIUM 9.4 9.7  --   --  8.7*  --  9.0  AST 53* 116*  --   --  134*  --   123*  ALT 28 53*  --   --  59*  --  70*  ALKPHOS  --  98  --   --  89  --  95  BILITOT 0.8 0.6  --   --  0.6  --  0.5  ALBUMIN  --  3.6  --   --  3.2*  --  3.1*  MG  --   --   --   --  2.0  --  2.2  PROCALCITON  --   --   --   --   --  0.34  --   LATICACIDVEN  --   --  1.2 1.4  --   --   --   INR  --   --  1.0  --   --   --   --   TSH 1.96  --   --   --   --  1.330  --     ------------------------------------------------------------------------------------------------------------------ No results for input(s): CHOL, HDL, LDLCALC, TRIG, CHOLHDL, LDLDIRECT in the last 72 hours.  Lab Results  Component Value Date   HGBA1C 5.8 (H) 12/07/2021   ------------------------------------------------------------------------------------------------------------------ Recent Labs    07/16/24 0101  TSH 1.330    Cardiac Enzymes No results for input(s): CKMB, TROPONINI, MYOGLOBIN in the last 168 hours.  Invalid input(s): CK ------------------------------------------------------------------------------------------------------------------ No results found for: BNP  CBG: No results for input(s): GLUCAP in the last 168 hours.  Recent Results (from the past 240 hours)  Resp panel by RT-PCR (RSV, Flu A&B, Covid) Anterior Nasal Swab     Status: None   Collection Time: 07/15/24 12:37 PM   Specimen: Anterior Nasal Swab  Result Value Ref Range Status   SARS Coronavirus 2 by RT PCR NEGATIVE NEGATIVE Final    Comment: (NOTE) SARS-CoV-2 target nucleic acids are NOT DETECTED.  The SARS-CoV-2 RNA is generally detectable in upper respiratory specimens during the acute phase of infection. The lowest concentration of SARS-CoV-2 viral copies this assay can detect is 138 copies/mL. A negative result does not preclude SARS-Cov-2 infection and should not be used as the sole basis for treatment or other patient management decisions. A negative result may occur with  improper  specimen collection/handling, submission of specimen other than nasopharyngeal swab, presence of viral mutation(s) within the areas targeted by this assay, and inadequate number of viral copies(<138 copies/mL). A negative result must be combined with clinical observations, patient history, and epidemiological information. The expected result is Negative.  Fact Sheet for Patients:  bloggercourse.com  Fact Sheet for Healthcare Providers:  seriousbroker.it  This test is no t yet approved or cleared by the [place]  FDA and  has been authorized for detection and/or diagnosis of SARS-CoV-2 by FDA under an Emergency Use Authorization (EUA). This EUA will remain  in effect (meaning this test can be used) for the duration of the COVID-19 declaration under Section 564(b)(1) of the Act, 21 U.S.C.section 360bbb-3(b)(1), unless the authorization is terminated  or revoked sooner.       Influenza A by PCR NEGATIVE NEGATIVE Final   Influenza B by PCR NEGATIVE NEGATIVE Final    Comment: (NOTE) The Xpert Xpress SARS-CoV-2/FLU/RSV plus assay is intended as an aid in the diagnosis of influenza from Nasopharyngeal swab specimens and should not be used as a sole basis for treatment. Nasal washings and aspirates are unacceptable for Xpert Xpress SARS-CoV-2/FLU/RSV testing.  Fact Sheet for Patients: bloggercourse.com  Fact Sheet for Healthcare Providers: seriousbroker.it  This test is not yet approved or cleared by the [place]  FDA and has been authorized for detection and/or diagnosis of SARS-CoV-2 by FDA under an Emergency Use Authorization (EUA). This EUA will remain in effect (meaning this test can be used) for the duration of the COVID-19 declaration under Section 564(b)(1) of the Act, 21  U.S.C. section 360bbb-3(b)(1), unless the authorization is terminated or revoked.     Resp  Syncytial Virus by PCR NEGATIVE NEGATIVE Final    Comment: (NOTE) Fact Sheet for Patients: bloggercourse.com  Fact Sheet for Healthcare Providers: seriousbroker.it  This test is not yet approved or cleared by the [place]  FDA and has been authorized for detection and/or diagnosis of SARS-CoV-2 by FDA under an Emergency Use Authorization (EUA). This EUA will remain in effect (meaning this test can be used) for the duration of the COVID-19 declaration under Section 564(b)(1) of the Act, 21 U.S.C. section 360bbb-3(b)(1), unless the authorization is terminated or revoked.  Performed at [facility], [address], [place], [place] [address]   Blood Culture (routine x 2)     Status: None (Preliminary result)   Collection Time: 07/15/24  1:15 PM   Specimen: BLOOD  Result Value Ref Range Status   Specimen Description   Final    BLOOD RIGHT ANTECUBITAL Performed at [facility], [address] [place], [place] [address]    Special Requests   Final    BOTTLES DRAWN AEROBIC AND ANAEROBIC Blood Culture adequate volume Performed at [facility], [address], [place], [place] [address]    Culture   Final    NO GROWTH 2 DAYS Performed at [facility], [address], [place], [place] [address]    Report Status PENDING  Incomplete  Blood Culture (routine x 2)     Status: None (Preliminary result)   Collection Time: 07/15/24  1:20 PM   Specimen: BLOOD LEFT HAND  Result Value Ref Range Status   Specimen Description   Final    BLOOD LEFT HAND Performed at [facility], [address] [place], [place] [address]    Special Requests   Final    BOTTLES DRAWN AEROBIC AND ANAEROBIC Blood Culture adequate volume Performed at [facility], [address], [place], [place] [address]    Culture   Final    NO GROWTH 2 DAYS Performed at [facility], [address], [place], [place] [address]    Report Status PENDING  Incomplete  Respiratory (~20 pathogens) panel by PCR     Status: None   Collection Time: 07/15/24  1:36 PM   Specimen: Anterior Nasal Swab; Respiratory  Result Value Ref Range Status   Adenovirus NOT DETECTED NOT DETECTED Final   Coronavirus 229E NOT DETECTED NOT DETECTED Final    Comment: (NOTE) The Coronavirus on the Respiratory Panel, DOES NOT test for the novel  Coronavirus (2019 nCoV)    Coronavirus HKU1 NOT DETECTED NOT DETECTED Final   Coronavirus NL63 NOT DETECTED NOT DETECTED Final   Coronavirus OC43 NOT DETECTED NOT DETECTED Final   Metapneumovirus NOT DETECTED NOT DETECTED Final   Rhinovirus / Enterovirus NOT DETECTED NOT DETECTED Final   Influenza A NOT DETECTED NOT DETECTED Final   Influenza B NOT DETECTED NOT DETECTED Final   Parainfluenza Virus 1 NOT DETECTED NOT DETECTED Final   Parainfluenza Virus 2 NOT DETECTED NOT DETECTED Final   Parainfluenza Virus 3 NOT DETECTED NOT DETECTED Final   Parainfluenza Virus 4 NOT DETECTED NOT DETECTED Final   Respiratory Syncytial Virus NOT DETECTED NOT DETECTED Final   Bordetella pertussis NOT DETECTED NOT DETECTED Final   Bordetella Parapertussis NOT DETECTED NOT DETECTED Final   Chlamydophila pneumoniae NOT DETECTED NOT DETECTED Final   Mycoplasma pneumoniae NOT DETECTED NOT DETECTED Final    Comment:  Performed at [facility], [address], [place], [place] [address]  Expectorated Sputum Assessment w Gram Stain, Rflx to Resp Cult     Status: None   Collection Time: 07/16/24 10:31 AM   Specimen: Expectorated Sputum  Result Value Ref Range Status   Specimen Description EXPECTORATED SPUTUM  Final   Special Requests NONE  Final   Sputum evaluation   Final    Sputum specimen not acceptable for testing.  Please recollect.   NOTIFIED FAYETTE, A 07/16/2024 1129 AJ Performed at [facility], [address] [place], [place]  [address]    Report Status 07/16/2024 FINAL  Final     Radiology Studies: No results found.   Nilda Fendt, MD, PhD [facility]  Between 7 am - 7 pm I am available, please contact me via Amion (for emergencies) or Securechat (non urgent messages)  Between 7 pm - 7 am I am not available, please contact night coverage MD/APP via Amion  "

## 2024-07-17 NOTE — Plan of Care (Signed)

## 2024-07-17 NOTE — Progress Notes (Signed)
 Mobility Specialist Progress Note:   07/17/24 1611  Mobility  Activity Ambulated independently  Level of Assistance Independent  Assistive Device None  Distance Ambulated (ft) 315 ft  Activity Response Tolerated well  Mobility Referral Yes  Mobility visit 1 Mobility  Mobility Specialist Start Time (ACUTE ONLY) 1600  Mobility Specialist Stop Time (ACUTE ONLY) 1609  Mobility Specialist Time Calculation (min) (ACUTE ONLY) 9 min   Pt was received in bed and agreed to mobility. No complaints/issues during ambulation. Returned to bed with all needs met. Left in room with family.  [employer] - Mobility Specialist

## 2024-07-17 NOTE — Progress Notes (Signed)
" °   07/17/24 1556  TOC Brief Assessment  Insurance and Status Reviewed  Patient has primary care physician Yes Odelia, Butler DASEN, MD)  Home environment has been reviewed Home alone  Prior level of function: Independent  Prior/Current Home Services No current home services  Social Drivers of Health Review SDOH reviewed no interventions necessary  Readmission risk has been reviewed Yes  Transition of care needs no transition of care needs at this time    "

## 2024-07-17 NOTE — Progress Notes (Signed)
 Patient's son, Garrel, called earlier for an update. This RN attempted to call him back with no response. Will try again later.

## 2024-07-17 NOTE — Plan of Care (Signed)

## 2024-07-17 NOTE — Plan of Care (Signed)
" °  Problem: Activity: Goal: Risk for activity intolerance will decrease 07/17/2024 0202 by Connell Deleta HERO, RN Outcome: Progressing 07/17/2024 0201 by Connell Deleta HERO, RN Outcome: Progressing   Problem: Nutrition: Goal: Adequate nutrition will be maintained 07/17/2024 0202 by Connell Deleta HERO, RN Outcome: Progressing 07/17/2024 0201 by Connell Deleta HERO, RN Outcome: Progressing   Problem: Coping: Goal: Level of anxiety will decrease Outcome: Progressing   Problem: Pain Managment: Goal: General experience of comfort will improve and/or be controlled 07/17/2024 0202 by Connell Deleta HERO, RN Outcome: Progressing 07/17/2024 0201 by Connell Deleta HERO, RN Outcome: Progressing   Problem: Safety: Goal: Ability to remain free from injury will improve Outcome: Progressing   Problem: Skin Integrity: Goal: Risk for impaired skin integrity will decrease Outcome: Progressing   Problem: Activity: Goal: Ability to tolerate increased activity will improve Outcome: Progressing   Problem: Clinical Measurements: Goal: Ability to maintain a body temperature in the normal range will improve Outcome: Progressing   "

## 2024-07-18 ENCOUNTER — Inpatient Hospital Stay (HOSPITAL_COMMUNITY)

## 2024-07-18 ENCOUNTER — Other Ambulatory Visit (HOSPITAL_COMMUNITY): Payer: Self-pay

## 2024-07-18 DIAGNOSIS — J189 Pneumonia, unspecified organism: Secondary | ICD-10-CM | POA: Diagnosis not present

## 2024-07-18 LAB — COMPREHENSIVE METABOLIC PANEL WITH GFR
ALT: 67 U/L — ABNORMAL HIGH (ref 0–44)
AST: 80 U/L — ABNORMAL HIGH (ref 15–41)
Albumin: 3.1 g/dL — ABNORMAL LOW (ref 3.5–5.0)
Alkaline Phosphatase: 73 U/L (ref 38–126)
Anion gap: 8 (ref 5–15)
BUN: 9 mg/dL (ref 8–23)
CO2: 26 mmol/L (ref 22–32)
Calcium: 8.8 mg/dL — ABNORMAL LOW (ref 8.9–10.3)
Chloride: 94 mmol/L — ABNORMAL LOW (ref 98–111)
Creatinine, Ser: 0.77 mg/dL (ref 0.61–1.24)
GFR, Estimated: >60 mL/min
Glucose, Bld: 122 mg/dL — ABNORMAL HIGH (ref 70–99)
Potassium: 4.1 mmol/L (ref 3.5–5.1)
Sodium: 128 mmol/L — ABNORMAL LOW (ref 135–145)
Total Bilirubin: 0.4 mg/dL (ref 0.0–1.2)
Total Protein: 5.9 g/dL — ABNORMAL LOW (ref 6.5–8.1)

## 2024-07-18 LAB — CBC
HCT: 38.1 % — ABNORMAL LOW (ref 39.0–52.0)
Hemoglobin: 13.3 g/dL (ref 13.0–17.0)
MCH: 30.2 pg (ref 26.0–34.0)
MCHC: 34.9 g/dL (ref 30.0–36.0)
MCV: 86.4 fL (ref 80.0–100.0)
Platelets: 353 K/uL (ref 150–400)
RBC: 4.41 MIL/uL (ref 4.22–5.81)
RDW: 13 % (ref 11.5–15.5)
WBC: 9.3 K/uL (ref 4.0–10.5)
nRBC: 0 % (ref 0.0–0.2)

## 2024-07-18 LAB — MAGNESIUM: Magnesium: 2.1 mg/dL (ref 1.7–2.4)

## 2024-07-18 MED ORDER — CEFDINIR 300 MG PO CAPS
300.0000 mg | ORAL_CAPSULE | Freq: Two times a day (BID) | ORAL | 0 refills | Status: AC
  Filled 2024-07-18: qty 8, 4d supply, fill #0

## 2024-07-18 MED ORDER — HYDROCORTISONE 0.5 % EX CREA
1.0000 | TOPICAL_CREAM | Freq: Two times a day (BID) | CUTANEOUS | 0 refills | Status: AC
  Filled 2024-07-18: qty 28.4, 14d supply, fill #0

## 2024-07-18 MED ORDER — GUAIFENESIN-DM 100-10 MG/5ML PO SYRP
5.0000 mL | ORAL_SOLUTION | ORAL | 0 refills | Status: DC | PRN
  Filled 2024-07-18: qty 237, 8d supply, fill #0

## 2024-07-18 MED ORDER — LISINOPRIL 10 MG PO TABS
10.0000 mg | ORAL_TABLET | Freq: Every day | ORAL | 0 refills | Status: DC
  Filled 2024-07-18: qty 30, 30d supply, fill #0

## 2024-07-18 NOTE — Progress Notes (Signed)
 PT Cancellation Note  Patient Details Name: Jesse Fletcher. MRN: [id number] DOB: [birth year]   Cancelled Treatment:    Reason Eval/Treat Not Completed: (P) PT screened, no needs identified, will sign off. Spoke with pt and he does not require any DME nor f/u therapy.  Elsie Grieves, PT, DPT [facility] Department Office: [phone]   Bevan Disney 07/18/2024, 1:17 PM

## 2024-07-18 NOTE — Discharge Instructions (Addendum)
 Follow with Jesse Butler DASEN, MD in 5-7 days.  Please have your labs rechecked by your PCP in the next week.  Please hold lisinopril -hydrochlorothiazide  as the hydrochlorothiazide  component can worsen your sodium levels.  Take the new prescription for lisinopril  only.  You may go back later on on the lisinopril -hydrochlorothiazide  as directed by her primary care MD after blood work and evaluating your blood pressure in the future  Please get a complete blood count and chemistry panel checked by your Primary MD at your next visit, and again as instructed by your Primary MD. Please get your medications reviewed and adjusted by your Primary MD.  Please request your Primary MD to go over all Hospital Tests and Procedure/Radiological results at the follow up, please get all Hospital records sent to your Prim MD by signing hospital release before you go home.  In some cases, there will be blood work, cultures and biopsy results pending at the time of your discharge. Please request that your primary care M.D. goes through all the records of your hospital data and follows up on these results.  If you had Pneumonia of Lung problems at the Hospital: Please get a 2 view Chest X ray done in 6-8 weeks after hospital discharge or sooner if instructed by your Primary MD.  If you have Congestive Heart Failure: Please call your Cardiologist or Primary MD anytime you have any of the following symptoms:  1) 3 pound weight gain in 24 hours or 5 pounds in 1 week  2) shortness of breath, with or without a dry hacking cough  3) swelling in the hands, feet or stomach  4) if you have to sleep on extra pillows at night in order to breathe  Follow cardiac low salt diet and 1.5 lit/day fluid restriction.  If you have diabetes Accuchecks 4 times/day, Once in AM empty stomach and then before each meal. Log in all results and show them to your primary doctor at your next visit. If any glucose reading is under 80 or above  300 call your primary MD immediately.  If you have Seizure/Convulsions/Epilepsy: Please do not drive, operate heavy machinery, participate in activities at heights or participate in high speed sports until you have seen by Primary MD or a Neurologist and advised to do so again. Per [place]  DMV statutes, patients with seizures are not allowed to drive until they have been seizure-free for six months.  Use caution when using heavy equipment or power tools. Avoid working on ladders or at heights. Take showers instead of baths. Ensure the water temperature is not too high on the home water heater. Do not go swimming alone. Do not lock yourself in a room alone (i.e. bathroom). When caring for infants or small children, sit down when holding, feeding, or changing them to minimize risk of injury to the child in the event you have a seizure. Maintain good sleep hygiene. Avoid alcohol.   If you had Gastrointestinal Bleeding: Please ask your Primary MD to check a complete blood count within one week of discharge or at your next visit. Your endoscopic/colonoscopic biopsies that are pending at the time of discharge, will also need to followed by your Primary MD.  Get Medicines reviewed and adjusted. Please take all your medications with you for your next visit with your Primary MD  Please request your Primary MD to go over all hospital tests and procedure/radiological results at the follow up, please ask your Primary MD to get all Hospital records  sent to his/her office.  If you experience worsening of your admission symptoms, develop shortness of breath, life threatening emergency, suicidal or homicidal thoughts you must seek medical attention immediately by calling 911 or calling your MD immediately  if symptoms less severe.  You must read complete instructions/literature along with all the possible adverse reactions/side effects for all the Medicines you take and that have been prescribed to you. Take  any new Medicines after you have completely understood and accpet all the possible adverse reactions/side effects.   Do not drive or operate heavy machinery when taking Pain medications.   Do not take more than prescribed Pain, Sleep and Anxiety Medications  Special Instructions: If you have smoked or chewed Tobacco  in the last 2 yrs please stop smoking, stop any regular Alcohol  and or any Recreational drug use.  Wear Seat belts while driving.  Please note You were cared for by a hospitalist during your hospital stay. If you have any questions about your discharge medications or the care you received while you were in the hospital after you are discharged, you can call the unit and asked to speak with the hospitalist on call if the hospitalist that took care of you is not available. Once you are discharged, your primary care physician will handle any further medical issues. Please note that NO REFILLS for any discharge medications will be authorized once you are discharged, as it is imperative that you return to your primary care physician (or establish a relationship with a primary care physician if you do not have one) for your aftercare needs so that they can reassess your need for medications and monitor your lab values.  You can reach the hospitalist office at phone [phone] or fax [phone]   If you do not have a primary care physician, you can call [phone] for a physician referral.  Activity: As tolerated with Full fall precautions use walker/cane & assistance as needed    Diet: regular  Disposition Home

## 2024-07-18 NOTE — Progress Notes (Signed)
 Jesse Fletcher [name]. to be D/C'd Home per MD order.  Discussed with the patient and all questions fully answered.  IV catheters discontinued intact. Site without signs and symptoms of complications. Dressing and pressure applied.  An After Visit Summary was printed and given to the patient. Patient awaiting prescriptions to be delivered from pharmacy.  D/c education completed with patient/family including follow up instructions, medication list, d/c activities limitations if indicated, with other d/c instructions as indicated by MD - patient able to verbalize understanding, all questions fully answered.   Patient instructed to return to ED, call 911, or call MD for any changes in condition.   Daughter at bedside to transport home once medication has been received.  [name] 07/18/2024 12:31 PM

## 2024-07-18 NOTE — Progress Notes (Signed)
 Discharge meds in a secure bag delivered to patient's RN by this RN.

## 2024-07-18 NOTE — Discharge Summary (Signed)
 "  Physician Discharge Summary  Jesse Fletcher. FMW:[id number] DOB: 1935-04-12 DOA: 07/15/2024  PCP: [name], MD  Admit date: 07/15/2024 Discharge date: 07/18/2024  Admitted From: home Disposition:  home  Recommendations for Outpatient Follow-up:  Follow up with PCP in 1-2 weeks Please obtain BMP/CBC in one week  Home Health: none Equipment/Devices: none  Discharge Condition: stable CODE STATUS: Full code Diet Orders (From admission, onward)     Start     Ordered   07/16/24 0020  Diet Heart Room service appropriate? Yes; Fluid consistency: Thin  Diet effective now       Question Answer Comment  Room service appropriate? Yes   Fluid consistency: Thin      07/16/24 0020           Brief Narrative / Interim history: 89 year old male with HTN, HLD, renal mass, comes into the hospital with cough, fever for about a week.  He has been having flulike symptoms.  He was seen by his PCP 3 days prior to admission on January 12th, and at that time COVID, flu were negative.  He was started on some amoxicillin , but had abdominal discomfort and was switched to azithromycin .  He has been having significant nausea, vomiting and eventually had repeat blood work done that showed a white count of 19K, sodium of 122 and he was directed to the ED.  Chest x-ray in the ED was concerning for pneumonia, he was placed on antibiotics and admitted to the hospital   Hospital Course / Discharge diagnoses: Principal problem Community-acquired pneumonia -he presented to the hospital productive cough, chest x-ray infiltrate, symptoms.  He was negative for flu, COVID.  He was stable on room air.  He was started on IV antibiotics with ceftriaxone  and azithromycin  with significant improvement in his symptoms.  His appetite improved, has been having more energy and able to ambulate in the hallway without difficulties.  With clinical improvement, he will be transition to oral antibiotics and discharged home  in stable condition.  He was advised to follow-up with PCP and have a repeat chest x-ray perhaps in about 2 to 3 weeks as an outpatient to ensure resolution   Active problems Hypochloremic hyponatremia -patient came to the hospital with poor p.o. intake and also taking his hydrochlorothiazide  at home, found to be hyponatremic.  He has received IV fluid with overall improvement in his sodium levels.  He did have few episodes of hyponatremia in the past going back as far as 2018.  His appetite has improved and he is now tolerating a regular diet.  Will discharge home and discontinue hydrochlorothiazide , was advised to have blood work done next week in the PCPs office Elevated LFTs -possibly in the setting of acute illness, he also has a degree of mild rhabdomyolysis which can contribute.  Right upper quadrant ultrasound unremarkable, acute hepatitis panel negative History of renal cell carcinoma-initially diagnosed in 2023, status post ablation.  Repeat MRI during this admission without recurrence or any concerning local issues Hyperlipidemia-statin on hold due to elevated LFTs Essential hypertension-blood pressure overall acceptable, continue home metoprolol  and lisinopril  alone, hold HCTZ  Sepsis ruled out   Discharge Instructions   Allergies as of 07/18/2024       Reactions   Doxycycline     GI upset         Medication List     STOP taking these medications    amoxicillin  875 MG tablet Commonly known as: AMOXIL    azithromycin  250 MG  tablet Commonly known as: Zithromax  Z-Pak   lisinopril -hydrochlorothiazide  10-12.5 MG tablet Commonly known as: ZESTORETIC        TAKE these medications    aspirin EC 81 MG tablet Take 81 mg by mouth daily. Swallow whole.   benzonatate  100 MG capsule Commonly known as: TESSALON  Take 1 capsule (100 mg total) by mouth 3 (three) times daily as needed.   cefdinir  300 MG capsule Commonly known as: OMNICEF  Take 1 capsule (300 mg total) by mouth  2 (two) times daily for 4 days.   CENTRUM SILVER ADULT 50+ PO Take 1 tablet by mouth daily.   guaiFENesin -dextromethorphan  100-10 MG/5ML syrup Commonly known as: ROBITUSSIN DM Take 5 mLs by mouth every 4 (four) hours as needed for cough (chest congestion).   hydrocortisone  cream 0.5 % Apply 1 Application topically 2 (two) times daily. Apply to back rash   lisinopril  10 MG tablet Commonly known as: Zestril  Take 1 tablet (10 mg total) by mouth daily.   metoprolol  tartrate 25 MG tablet Commonly known as: LOPRESSOR  Take 1 tablet by mouth twice daily   neomycin -polymyxin-hydrocortisone  OTIC solution Commonly known as: CORTISPORIN Place 3 drops into the left ear 4 (four) times daily.   ondansetron  4 MG tablet Commonly known as: Zofran  Take 1 tablet (4 mg total) by mouth every 8 (eight) hours as needed for nausea or vomiting.   simvastatin  20 MG tablet Commonly known as: ZOCOR  TAKE 1 TABLET BY MOUTH AT BEDTIME        Follow-up Information     [name], MD Follow up in 1 week(s).   Specialty: Family Medicine Contact information: [address] [place] [address] [phone]                Consultations: none  Procedures/Studies:  US  Abdomen Limited RUQ (LIVER/GB) Result Date: 07/16/2024 CLINICAL DATA:  Transaminitis. EXAM: ULTRASOUND ABDOMEN LIMITED RIGHT UPPER QUADRANT COMPARISON:  CT 09/19/2021 FINDINGS: Gallbladder: No gallstones or wall thickening visualized. No sonographic Murphy sign noted by sonographer. Common bile duct: Diameter: 2.3 mm. Liver: Visualization of the left lobe of the liver is difficult due to overlying bowel gas and ribs. No focal lesion identified. Within normal limits in parenchymal echogenicity. Portal vein is patent on color Doppler imaging with normal direction of blood flow towards the liver. Other: No free fluid. IMPRESSION: No acute findings. Electronically Signed   By: Toribio Agreste M.D.   On: 07/16/2024 08:00   DG  Chest 2 View Result Date: 07/15/2024 EXAM: PA AND LATERAL (2) VIEW(S) XRAY OF THE CHEST 07/15/2024 12:06:54 PM COMPARISON: PA and lateral radiographs of the chest dated 01/23/2018. CLINICAL HISTORY: FINDINGS: LUNGS AND PLEURA: Patchy airspace opacity at left base, possibly representing atelectasis or pneumonia. No pleural effusion. No pneumothorax. HEART AND MEDIASTINUM: Aortic atherosclerosis. No acute abnormality of the cardiac and mediastinal silhouettes. BONES AND SOFT TISSUES: No acute osseous abnormality. IMPRESSION: 1. Patchy airspace opacity at the left base, possibly representing atelectasis or pneumonia. 2. Aortic atherosclerosis. Electronically signed by: Evalene Coho MD 07/15/2024 12:43 PM EST RP Workstation: [id number]   MR ABDOMEN WWO CONTRAST Result Date: 07/15/2024 CLINICAL DATA:  Left renal mass. EXAM: MRI ABDOMEN WITHOUT AND WITH CONTRAST TECHNIQUE: Multiplanar multisequence MR imaging of the abdomen was performed both before and after the administration of intravenous contrast. CONTRAST:  9mL GADAVIST  GADOBUTROL  1 MMOL/ML IV SOLN COMPARISON:  MRI abdomen from 06/12/2023. FINDINGS: The technologist noted Patient wears hearing aids. Patient states he is unable to hear the breathing  instructions even after adjusting volume. Best images obtained Lower chest: There are heterogeneous opacities in the bilateral lower lobes, left more than right, concerning for pneumonitis. Correlate clinically. No pleural effusion. Top-normal heart size. No pericardial effusion. Hepatobiliary: The liver is mildly enlarged in size measuring up to 16.5 cm in length. Noncirrhotic configuration. No suspicious liver lesion. No intrahepatic or extrahepatic bile duct dilatation. No choledocholithiasis. Unremarkable gallbladder. Pancreas: No mass, inflammatory changes or other parenchymal abnormality identified. No main pancreatic duct dilation. Spleen: Mildly enlarged measuring upto 6.3 x 12.9 cm orthogonally on  coronal plane. No focal mass. Adrenals/Urinary Tract: Unremarkable adrenal glands. No hydroureteronephrosis. There are several scattered simple cysts throughout bilateral kidneys with largest arising from the right kidney lower pole, anteriorly measuring up to 1.2 x 1.5 cm. Redemonstration of post ablation cavity in the left kidney interpolar region, laterally measuring approximately 2.0 x 2.8 cm. The cavity is Iso to slightly hyperintense on T1 weighted images, hypointense on T2 weighted images it does not exhibit significant contrast enhancement on subtraction/postcontrast images. Findings favor satisfactory response of cryoablation. No residual/recurrent viable tumor seen. Stomach/Bowel: Visualized portions within the abdomen are unremarkable. No disproportionate dilation of bowel loops. Vascular/Lymphatic: No pathologically enlarged lymph nodes identified. No abdominal aortic aneurysm demonstrated. No ascites. Other:  None. Musculoskeletal: No suspicious bone lesions identified. IMPRESSION: 1. Redemonstration of post ablation changes in the left kidney interpolar region. No residual/recurrent viable tumor seen. No metastatic disease identified within the abdomen. 2. There are heterogeneous opacities in the bilateral lower lobes, left more than right, concerning for pneumonitis. Correlate clinically. 3. Otherwise essentially unremarkable exam, as described above. Electronically Signed   By: Ree Molt M.D.   On: 07/15/2024 11:27     Subjective: - no chest pain, shortness of breath, no abdominal pain, nausea or vomiting.   Discharge Exam: BP 130/86 (BP Location: Right Arm)   Pulse 90   Temp 98.3 F (36.8 C)   Resp 20   Ht 6' 2 (1.88 m)   Wt 98.2 kg   SpO2 95%   BMI 27.80 kg/m   General: Pt is alert, awake, not in acute distress Cardiovascular: RRR, S1/S2 +, no rubs, no gallops Respiratory: CTA bilaterally, no wheezing, no rhonchi Abdominal: Soft, NT, ND, bowel sounds + Extremities: no  edema, no cyanosis  The results of significant diagnostics from this hospitalization (including imaging, microbiology, ancillary and laboratory) are listed below for reference.    Microbiology: Recent Results (from the past 240 hours)  Resp panel by RT-PCR (RSV, Flu A&B, Covid) Anterior Nasal Swab     Status: None   Collection Time: 07/15/24 12:37 PM   Specimen: Anterior Nasal Swab  Result Value Ref Range Status   SARS Coronavirus 2 by RT PCR NEGATIVE NEGATIVE Final    Comment: (NOTE) SARS-CoV-2 target nucleic acids are NOT DETECTED.  The SARS-CoV-2 RNA is generally detectable in upper respiratory specimens during the acute phase of infection. The lowest concentration of SARS-CoV-2 viral copies this assay can detect is 138 copies/mL. A negative result does not preclude SARS-Cov-2 infection and should not be used as the sole basis for treatment or other patient management decisions. A negative result may occur with  improper specimen collection/handling, submission of specimen other than nasopharyngeal swab, presence of viral mutation(s) within the areas targeted by this assay, and inadequate number of viral copies(<138 copies/mL). A negative result must be combined with clinical observations, patient history, and epidemiological information. The expected result is Negative.  Fact Sheet for Patients:  bloggercourse.com  Fact Sheet for Healthcare Providers:  seriousbroker.it  This test is no t yet approved or cleared by the [place]  FDA and  has been authorized for detection and/or diagnosis of SARS-CoV-2 by FDA under an Emergency Use Authorization (EUA). This EUA will remain  in effect (meaning this test can be used) for the duration of the COVID-19 declaration under Section 564(b)(1) of the Act, 21 U.S.C.section 360bbb-3(b)(1), unless the authorization is terminated  or revoked sooner.       Influenza A by PCR NEGATIVE  NEGATIVE Final   Influenza B by PCR NEGATIVE NEGATIVE Final    Comment: (NOTE) The Xpert Xpress SARS-CoV-2/FLU/RSV plus assay is intended as an aid in the diagnosis of influenza from Nasopharyngeal swab specimens and should not be used as a sole basis for treatment. Nasal washings and aspirates are unacceptable for Xpert Xpress SARS-CoV-2/FLU/RSV testing.  Fact Sheet for Patients: bloggercourse.com  Fact Sheet for Healthcare Providers: seriousbroker.it  This test is not yet approved or cleared by the [place]  FDA and has been authorized for detection and/or diagnosis of SARS-CoV-2 by FDA under an Emergency Use Authorization (EUA). This EUA will remain in effect (meaning this test can be used) for the duration of the COVID-19 declaration under Section 564(b)(1) of the Act, 21 U.S.C. section 360bbb-3(b)(1), unless the authorization is terminated or revoked.     Resp Syncytial Virus by PCR NEGATIVE NEGATIVE Final    Comment: (NOTE) Fact Sheet for Patients: bloggercourse.com  Fact Sheet for Healthcare Providers: seriousbroker.it  This test is not yet approved or cleared by the [place]  FDA and has been authorized for detection and/or diagnosis of SARS-CoV-2 by FDA under an Emergency Use Authorization (EUA). This EUA will remain in effect (meaning this test can be used) for the duration of the COVID-19 declaration under Section 564(b)(1) of the Act, 21 U.S.C. section 360bbb-3(b)(1), unless the authorization is terminated or revoked.  Performed at [facility], [address], [place], [place] [address]   Blood Culture (routine x 2)     Status: None (Preliminary result)   Collection Time: 07/15/24  1:15 PM   Specimen: BLOOD  Result Value Ref Range Status   Specimen Description   Final    BLOOD RIGHT ANTECUBITAL Performed at [facility], [address], [place], [place] [address]    Special Requests   Final    BOTTLES DRAWN AEROBIC AND ANAEROBIC Blood Culture adequate volume Performed at [facility], [address], [place], [place] [address]    Culture   Final    NO GROWTH 3 DAYS Performed at [facility], [address], [place], [place] [address]    Report Status PENDING  Incomplete  Blood Culture (routine x 2)     Status: None (Preliminary result)   Collection Time: 07/15/24  1:20 PM   Specimen: BLOOD LEFT HAND  Result Value Ref Range Status   Specimen Description   Final    BLOOD LEFT HAND Performed at [facility], [address], [place], [place] [address]    Special Requests   Final    BOTTLES DRAWN AEROBIC AND ANAEROBIC Blood Culture adequate volume Performed at [facility], [address] [place], [place] [address]    Culture   Final    NO GROWTH 3 DAYS Performed at [facility], [address] [place], [place] [address]    Report Status PENDING  Incomplete  Respiratory (~20 pathogens) panel  by PCR     Status: None   Collection Time: 07/15/24  1:36 PM   Specimen: Anterior Nasal Swab; Respiratory  Result Value Ref Range Status   Adenovirus NOT DETECTED NOT DETECTED Final   Coronavirus 229E NOT DETECTED NOT DETECTED Final    Comment: (NOTE) The Coronavirus on the Respiratory Panel, DOES NOT test for the novel  Coronavirus (2019 nCoV)    Coronavirus HKU1 NOT DETECTED NOT DETECTED Final   Coronavirus NL63 NOT DETECTED NOT DETECTED Final   Coronavirus OC43 NOT DETECTED NOT DETECTED Final   Metapneumovirus NOT DETECTED NOT DETECTED Final   Rhinovirus / Enterovirus NOT DETECTED NOT DETECTED Final   Influenza A NOT DETECTED NOT DETECTED Final   Influenza B NOT DETECTED NOT DETECTED Final   Parainfluenza Virus 1 NOT DETECTED NOT DETECTED Final   Parainfluenza Virus 2 NOT DETECTED NOT DETECTED Final   Parainfluenza Virus 3 NOT  DETECTED NOT DETECTED Final   Parainfluenza Virus 4 NOT DETECTED NOT DETECTED Final   Respiratory Syncytial Virus NOT DETECTED NOT DETECTED Final   Bordetella pertussis NOT DETECTED NOT DETECTED Final   Bordetella Parapertussis NOT DETECTED NOT DETECTED Final   Chlamydophila pneumoniae NOT DETECTED NOT DETECTED Final   Mycoplasma pneumoniae NOT DETECTED NOT DETECTED Final    Comment: Performed at [facility], [address], [place], [place] [address]  Expectorated Sputum Assessment w Gram Stain, Rflx to Resp Cult     Status: None   Collection Time: 07/16/24 10:31 AM   Specimen: Expectorated Sputum  Result Value Ref Range Status   Specimen Description EXPECTORATED SPUTUM  Final   Special Requests NONE  Final   Sputum evaluation   Final    Sputum specimen not acceptable for testing.  Please recollect.   NOTIFIED FAYETTE, A 07/16/2024 1129 AJ Performed at [facility], [address] [place], [place] [address]    Report Status 07/16/2024 FINAL  Final     Labs: Basic Metabolic Panel: Recent Labs  Lab 07/14/24 1050 07/15/24 1129 07/16/24 0100 07/17/24 0752 07/18/24 0617  NA 122* 120* 124* 129* 128*  K 4.5 4.0 3.9 3.3* 4.1  CL 84* 82* 90* 94* 94*  CO2 28 25 23 23 26   GLUCOSE 141* 144* 122* 131* 122*  BUN 14 19 16 11 9   CREATININE 0.88 1.12 0.89 0.76 0.77  CALCIUM 9.4 9.7 8.7* 9.0 8.8*  MG  --   --  2.0 2.2 2.1  PHOS  --   --  3.1  --   --    Liver Function Tests: Recent Labs  Lab 07/14/24 1050 07/15/24 1129 07/16/24 0100 07/17/24 0752 07/18/24 0617  AST 53* 116* 134* 123* 80*  ALT 28 53* 59* 70* 67*  ALKPHOS  --  98 89 95 73  BILITOT 0.8 0.6 0.6 0.5 0.4  PROT 6.7 7.0 6.3* 6.4* 5.9*  ALBUMIN  --  3.6 3.2* 3.1* 3.1*   CBC: Recent Labs  Lab 07/14/24 1050 07/15/24 1129 07/16/24 0100 07/17/24 0752 07/18/24 0617  WBC 19.4* 16.6* 13.7* 8.2 9.3  NEUTROABS 16,781* 14.2*  --   --   --   HGB 15.2 14.4 13.5 14.3 13.3  HCT 45.1 40.9 39.0 40.7  38.1*  MCV 88.6 84.7 86.5 85.9 86.4  PLT 392 395 325 349 353   CBG: No results for input(s): GLUCAP in the last 168 hours. Hgb A1c No results for input(s): HGBA1C in the last 72 hours. Lipid Profile No results for input(s):  CHOL, HDL, LDLCALC, TRIG, CHOLHDL, LDLDIRECT in the last 72 hours. Thyroid  function studies Recent Labs    07/16/24 0101  TSH 1.330   Urinalysis    Component Value Date/Time   COLORURINE YELLOW 07/15/2024 1415   APPEARANCEUR CLEAR 07/15/2024 1415   APPEARANCEUR Clear 10/17/2021 1417   LABSPEC 1.028 07/15/2024 1415   PHURINE 6.5 07/15/2024 1415   GLUCOSEU NEGATIVE 07/15/2024 1415   HGBUR MODERATE (A) 07/15/2024 1415   BILIRUBINUR NEGATIVE 07/15/2024 1415   BILIRUBINUR Negative 10/17/2021 1417   KETONESUR 15 (A) 07/15/2024 1415   PROTEINUR 30 (A) 07/15/2024 1415   NITRITE NEGATIVE 07/15/2024 1415   LEUKOCYTESUR NEGATIVE 07/15/2024 1415    FURTHER DISCHARGE INSTRUCTIONS:   Get Medicines reviewed and adjusted: Please take all your medications with you for your next visit with your Primary MD   Laboratory/radiological data: Please request your Primary MD to go over all hospital tests and procedure/radiological results at the follow up, please ask your Primary MD to get all Hospital records sent to his/her office.   In some cases, they will be blood work, cultures and biopsy results pending at the time of your discharge. Please request that your primary care M.D. goes through all the records of your hospital data and follows up on these results.   Also Note the following: If you experience worsening of your admission symptoms, develop shortness of breath, life threatening emergency, suicidal or homicidal thoughts you must seek medical attention immediately by calling 911 or calling your MD immediately  if symptoms less severe.   You must read complete instructions/literature along with all the possible adverse reactions/side effects for all  the Medicines you take and that have been prescribed to you. Take any new Medicines after you have completely understood and accpet all the possible adverse reactions/side effects.    Do not drive when taking Pain medications or sleeping medications (Benzodaizepines)   Do not take more than prescribed Pain, Sleep and Anxiety Medications. It is not advisable to combine anxiety,sleep and pain medications without talking with your primary care practitioner   Special Instructions: If you have smoked or chewed Tobacco  in the last 2 yrs please stop smoking, stop any regular Alcohol  and or any Recreational drug use.   Wear Seat belts while driving.   Please note: You were cared for by a hospitalist during your hospital stay. Once you are discharged, your primary care physician will handle any further medical issues. Please note that NO REFILLS for any discharge medications will be authorized once you are discharged, as it is imperative that you return to your primary care physician (or establish a relationship with a primary care physician if you do not have one) for your post hospital discharge needs so that they can reassess your need for medications and monitor your lab values.  Time coordinating discharge: 35 minutes  SIGNED:  Nilda Fendt, MD, PhD 07/18/2024, 11:37 AM   "

## 2024-07-20 ENCOUNTER — Telehealth: Payer: Self-pay

## 2024-07-20 ENCOUNTER — Other Ambulatory Visit (HOSPITAL_COMMUNITY): Payer: Self-pay

## 2024-07-20 LAB — CULTURE, BLOOD (ROUTINE X 2)
Culture: NO GROWTH
Culture: NO GROWTH
Special Requests: ADEQUATE
Special Requests: ADEQUATE

## 2024-07-20 NOTE — Transitions of Care (Post Inpatient/ED Visit) (Signed)
 "  07/20/2024  Name: Jesse Fletcher. MRN: [id number] DOB: 02-22-1935  Today's TOC FU Call Status: Today's TOC FU Call Status:: Successful TOC FU Call Completed TOC FU Call Complete Date: 07/20/24  Patient's Name and Date of Birth confirmed. Name, DOB  Transition Care Management Follow-up Telephone Call Date of Discharge: 07/18/24 Discharge Facility: [facility]) Type of Discharge: Inpatient Admission Primary Inpatient Discharge Diagnosis:: pneumonia How have you been since you were released from the hospital?: Better Any questions or concerns?: No  Items Reviewed: Did you receive and understand the discharge instructions provided?: Yes Medications obtained,verified, and reconciled?: Yes (Medications Reviewed) Any new allergies since your discharge?: No Dietary orders reviewed?: Yes Do you have support at home?: No  Medications Reviewed Today: Medications Reviewed Today     Reviewed by Emmitt Pan, LPN (Licensed Practical Nurse) on 07/20/24 at [phone]  Med List Status: <None>   Medication Order Taking? Sig Documenting Provider Last Dose Status Informant  aspirin EC 81 MG tablet [id number] Yes Take 81 mg by mouth daily. Swallow whole. [provider]  Active Self, Pharmacy Records  benzonatate  (TESSALON ) 100 MG capsule [id number]  Take 1 capsule (100 mg total) by mouth 3 (three) times daily as needed.  Patient not taking: Reported on 07/20/2024   Aletha Bene, MD  Active Self, Pharmacy Records  cefdinir  (OMNICEF ) 300 MG capsule [id number] Yes Take 1 capsule (300 mg total) by mouth 2 (two) times daily for 4 days. Gherghe, Costin M, MD  Active   guaiFENesin -dextromethorphan  (ROBITUSSIN DM) 100-10 MG/5ML syrup [id number] Yes Take 5 mLs by mouth every 4 (four) hours as needed for cough (chest congestion). Gherghe, Costin M, MD  Active   hydrocortisone  cream 0.5 % [id number] Yes Apply 1 Application topically 2 (two) times daily. Apply to back rash Gherghe, Costin M, MD   Active   lisinopril  (ZESTRIL ) 10 MG tablet [id number] Yes Take 1 tablet (10 mg total) by mouth daily. Gherghe, Costin M, MD  Active   metoprolol  tartrate (LOPRESSOR ) 25 MG tablet [id number] Yes Take 1 tablet by mouth twice daily [name], MD  Active Self, Pharmacy Records  Multiple Vitamins-Minerals (CENTRUM SILVER ADULT 50+ PO) [id number] Yes Take 1 tablet by mouth daily. [provider]  Active Self, Pharmacy Records  neomycin -polymyxin-hydrocortisone  (CORTISPORIN) OTIC solution [id number] Yes Place 3 drops into the left ear 4 (four) times daily. [name], MD  Active Self, Pharmacy Records  ondansetron  (ZOFRAN ) 4 MG tablet [id number] Yes Take 1 tablet (4 mg total) by mouth every 8 (eight) hours as needed for nausea or vomiting. Aletha Bene, MD  Active Self, Pharmacy Records  simvastatin  (ZOCOR ) 20 MG tablet [id number] Yes TAKE 1 TABLET BY MOUTH AT BEDTIME [name], MD  Active Self, Pharmacy Records            Home Care and Equipment/Supplies: Were Home Health Services Ordered?: NA Any new equipment or medical supplies ordered?: NA  Functional Questionnaire: Do you need assistance with bathing/showering or dressing?: No Do you need assistance with meal preparation?: No Do you need assistance with eating?: No Do you have difficulty maintaining continence: No Do you need assistance with getting out of bed/getting out of a chair/moving?: No  Follow up appointments reviewed: PCP Follow-up appointment confirmed?: Yes Date of PCP follow-up appointment?: 07/27/24 Follow-up Provider: [facility] Follow-up appointment confirmed?: NA Do you need transportation to your follow-up appointment?: No Do you understand care options if your condition(s) worsen?: Yes-patient verbalized understanding  SIGNATURE Julian Lemmings, LPN [facility] Nurse Health Advisor Direct Dial [phone]  "

## 2024-07-23 ENCOUNTER — Other Ambulatory Visit

## 2024-07-27 ENCOUNTER — Inpatient Hospital Stay: Admitting: Family Medicine

## 2024-07-31 ENCOUNTER — Ambulatory Visit: Admitting: Family Medicine

## 2024-07-31 ENCOUNTER — Ambulatory Visit: Admitting: Podiatry

## 2024-07-31 ENCOUNTER — Encounter: Payer: Self-pay | Admitting: Family Medicine

## 2024-07-31 VITALS — BP 120/70 | HR 76 | Temp 97.8°F | Ht 74.0 in | Wt 205.0 lb

## 2024-07-31 DIAGNOSIS — R7989 Other specified abnormal findings of blood chemistry: Secondary | ICD-10-CM | POA: Diagnosis not present

## 2024-07-31 DIAGNOSIS — Z8701 Personal history of pneumonia (recurrent): Secondary | ICD-10-CM

## 2024-07-31 DIAGNOSIS — E871 Hypo-osmolality and hyponatremia: Secondary | ICD-10-CM | POA: Diagnosis not present

## 2024-07-31 LAB — COMPREHENSIVE METABOLIC PANEL WITH GFR
AG Ratio: 1.7 (calc) (ref 1.0–2.5)
ALT: 20 U/L (ref 9–46)
AST: 16 U/L (ref 10–35)
Albumin: 4 g/dL (ref 3.6–5.1)
Alkaline phosphatase (APISO): 61 U/L (ref 35–144)
BUN: 15 mg/dL (ref 7–25)
CO2: 30 mmol/L (ref 20–32)
Calcium: 9.6 mg/dL (ref 8.6–10.3)
Chloride: 96 mmol/L — ABNORMAL LOW (ref 98–110)
Creat: 0.95 mg/dL (ref 0.70–1.22)
Globulin: 2.4 g/dL (ref 1.9–3.7)
Glucose, Bld: 84 mg/dL (ref 65–99)
Potassium: 5.5 mmol/L — ABNORMAL HIGH (ref 3.5–5.3)
Sodium: 130 mmol/L — ABNORMAL LOW (ref 135–146)
Total Bilirubin: 0.8 mg/dL (ref 0.2–1.2)
Total Protein: 6.4 g/dL (ref 6.1–8.1)
eGFR: 77 mL/min/{1.73_m2}

## 2024-07-31 LAB — CBC WITH DIFFERENTIAL/PLATELET
Absolute Lymphocytes: 1461 {cells}/uL (ref 850–3900)
Absolute Monocytes: 847 {cells}/uL (ref 200–950)
Basophils Absolute: 83 {cells}/uL (ref 0–200)
Basophils Relative: 1 %
Eosinophils Absolute: 191 {cells}/uL (ref 15–500)
Eosinophils Relative: 2.3 %
HCT: 44.3 % (ref 39.4–51.1)
Hemoglobin: 14.8 g/dL (ref 13.2–17.1)
MCH: 30.1 pg (ref 27.0–33.0)
MCHC: 33.4 g/dL (ref 31.6–35.4)
MCV: 90.2 fL (ref 81.4–101.7)
MPV: 8.7 fL (ref 7.5–12.5)
Monocytes Relative: 10.2 %
Neutro Abs: 5719 {cells}/uL (ref 1500–7800)
Neutrophils Relative %: 68.9 %
Platelets: 590 10*3/uL — ABNORMAL HIGH (ref 140–400)
RBC: 4.91 Million/uL (ref 4.20–5.80)
RDW: 13.1 % (ref 11.0–15.0)
Total Lymphocyte: 17.6 %
WBC: 8.3 10*3/uL (ref 3.8–10.8)

## 2024-07-31 MED ORDER — LISINOPRIL 10 MG PO TABS: 10.0000 mg | ORAL_TABLET | Freq: Every day | ORAL | 3 refills | Status: AC

## 2024-07-31 MED ORDER — METOPROLOL TARTRATE 25 MG PO TABS: 25.0000 mg | ORAL_TABLET | Freq: Two times a day (BID) | ORAL | 3 refills | Status: AC

## 2024-07-31 NOTE — Progress Notes (Signed)
 "  Subjective:    Patient ID: Jesse KANDICE Claudene Mickey., male    DOB: 1934/07/11, 89 y.o.   MRN: [id number] Underwent ablation of the left clear cell renal carcinoma in 2023.  Recently admitted with pneumonia:  Brief Narrative / Interim history: 89 year old male with HTN, HLD, renal mass, comes into the hospital with cough, fever for about a week.  He has been having flulike symptoms.  He was seen by his PCP 3 days prior to admission on January 12th, and at that time COVID, flu were negative.  He was started on some amoxicillin , but had abdominal discomfort and was switched to azithromycin .  He has been having significant nausea, vomiting and eventually had repeat blood work done that showed a white count of 19K, sodium of 122 and he was directed to the ED.  Chest x-ray in the ED was concerning for pneumonia, he was placed on antibiotics and admitted to the hospital    Hospital Course / Discharge diagnoses: Principal problem Community-acquired pneumonia -he presented to the hospital productive cough, chest x-ray infiltrate, symptoms.  He was negative for flu, COVID.  He was stable on room air.  He was started on IV antibiotics with ceftriaxone  and azithromycin  with significant improvement in his symptoms.  His appetite improved, has been having more energy and able to ambulate in the hallway without difficulties.  With clinical improvement, he will be transition to oral antibiotics and discharged home in stable condition.  He was advised to follow-up with PCP and have a repeat chest x-ray perhaps in about 2 to 3 weeks as an outpatient to ensure resolution   Active problems Hypochloremic hyponatremia -patient came to the hospital with poor p.o. intake and also taking his hydrochlorothiazide  at home, found to be hyponatremic.  He has received IV fluid with overall improvement in his sodium levels.  He did have few episodes of hyponatremia in the past going back as far as 2018.  His appetite has improved and he is  now tolerating a regular diet.  Will discharge home and discontinue hydrochlorothiazide , was advised to have blood work done next week in the PCPs office Elevated LFTs -possibly in the setting of acute illness, he also has a degree of mild rhabdomyolysis which can contribute.  Right upper quadrant ultrasound unremarkable, acute hepatitis panel negative History of renal cell carcinoma-initially diagnosed in 2023, status post ablation.  Repeat MRI during this admission without recurrence or any concerning local issues Hyperlipidemia-statin on hold due to elevated LFTs Essential hypertension-blood pressure overall acceptable, continue home metoprolol  and lisinopril  alone, hold hydrochlorothiazide   07/31/24  Patient states he feels much better. Cough has resolved.  Denies N,V,D.  Drinking about 1 gallon of water a day.  Has stopped hydrochlorothiazide  and simvastatin .  Past Medical History:  Diagnosis Date   Arthritis    Ascending aorta dilation    Blood in stool    Hemorrhoids    History of kidney stones    Hyperlipidemia    Hypertension    Pre-diabetes    Rectal fissure    Rectal pain    Skin cancer    Past Surgical History:  Procedure Laterality Date   HERNIA REPAIR  2000 (approx)   IR RADIOLOGIST EVAL & MGMT  11/09/2021   IR RADIOLOGIST EVAL & MGMT  01/15/2022   lower back surgery  05/1995, 05/1998   RADIOLOGY WITH ANESTHESIA Left 12/20/2021   Procedure: CT WITH ANESTHESIA CRYOABLATION;  Surgeon: Philip Cornet, MD;  Location: [facility];  Service:  Anesthesiology;  Laterality: Left;   SHOULDER SURGERY  1997   both   Current Outpatient Medications on File Prior to Visit  Medication Sig Dispense Refill   aspirin EC 81 MG tablet Take 81 mg by mouth daily. Swallow whole.     benzonatate  (TESSALON ) 100 MG capsule Take 1 capsule (100 mg total) by mouth 3 (three) times daily as needed. (Patient not taking: Reported on 07/20/2024) 30 capsule 0   guaiFENesin -dextromethorphan  (ROBITUSSIN DM) 100-10  MG/5ML syrup Take 5 mLs by mouth every 4 (four) hours as needed for cough (chest congestion). 237 mL 0   hydrocortisone  cream 0.5 % Apply 1 Application topically 2 (two) times daily. Apply to back rash 28.4 g 0   lisinopril  (ZESTRIL ) 10 MG tablet Take 1 tablet (10 mg total) by mouth daily. 30 tablet 0   metoprolol  tartrate (LOPRESSOR ) 25 MG tablet Take 1 tablet by mouth twice daily 180 tablet 0   Multiple Vitamins-Minerals (CENTRUM SILVER ADULT 50+ PO) Take 1 tablet by mouth daily.     neomycin -polymyxin-hydrocortisone  (CORTISPORIN) OTIC solution Place 3 drops into the left ear 4 (four) times daily. 10 mL 0   ondansetron  (ZOFRAN ) 4 MG tablet Take 1 tablet (4 mg total) by mouth every 8 (eight) hours as needed for nausea or vomiting. 20 tablet 0   simvastatin  (ZOCOR ) 20 MG tablet TAKE 1 TABLET BY MOUTH AT BEDTIME 90 tablet 0   No current facility-administered medications on file prior to visit.   Allergies  Allergen Reactions   Doxycycline      GI upset    Social History   Socioeconomic History   Marital status: Widowed    Spouse name: Not on file   Number of children: Not on file   Years of education: Not on file   Highest education level: Not on file  Occupational History   Not on file  Tobacco Use   Smoking status: Never   Smokeless tobacco: Never  Vaping Use   Vaping status: Never Used  Substance and Sexual Activity   Alcohol use: No   Drug use: No   Sexual activity: Not on file  Other Topics Concern   Not on file  Social History Narrative   Not on file   Social Drivers of Health   Tobacco Use: Low Risk (07/16/2024)   Patient History    Smoking Tobacco Use: Never    Smokeless Tobacco Use: Never    Passive Exposure: Not on file  Financial Resource Strain: Low Risk (10/10/2023)   Overall Financial Resource Strain (CARDIA)    Difficulty of Paying Living Expenses: Not hard at all  Food Insecurity: No Food Insecurity (07/16/2024)   Epic    Worried About Brewing Technologist in the Last Year: Never true    Ran Out of Food in the Last Year: Never true  Transportation Needs: No Transportation Needs (07/16/2024)   Epic    Lack of Transportation (Medical): No    Lack of Transportation (Non-Medical): No  Physical Activity: Sufficiently Active (10/10/2023)   Exercise Vital Sign    Days of Exercise per Week: 5 days    Minutes of Exercise per Session: 40 min  Stress: No Stress Concern Present (10/10/2023)   [employer] of Occupational Health - Occupational Stress Questionnaire    Feeling of Stress : Not at all  Social Connections: Moderately Integrated (07/16/2024)   Social Connection and Isolation Panel    Frequency of Communication with Friends and Family: Twice a week  Frequency of Social Gatherings with Friends and Family: More than three times a week    Attends Religious Services: More than 4 times per year    Active Member of Clubs or Organizations: No    Attends Engineer, Structural: More than 4 times per year    Marital Status: Divorced  Intimate Partner Violence: Not At Risk (07/16/2024)   Epic    Fear of Current or Ex-Partner: No    Emotionally Abused: No    Physically Abused: No    Sexually Abused: No  Depression (PHQ2-9): Medium Risk (10/10/2023)   Depression (PHQ2-9)    PHQ-2 Score: 5  Alcohol Screen: Low Risk (10/10/2023)   Alcohol Screen    Last Alcohol Screening Score (AUDIT): 0  Housing: Low Risk (07/16/2024)   Epic    Unable to Pay for Housing in the Last Year: No    Number of Times Moved in the Last Year: 0    Homeless in the Last Year: No  Utilities: Not At Risk (07/16/2024)   Epic    Threatened with loss of utilities: No  Health Literacy: Adequate Health Literacy (10/10/2023)   B1300 Health Literacy    Frequency of need for help with medical instructions: Never      Review of Systems  All other systems reviewed and are negative.      Objective:   Physical Exam Vitals reviewed.  Constitutional:       Appearance: Normal appearance. He is normal weight.  HENT:     Head: Normocephalic and atraumatic.  Cardiovascular:     Rate and Rhythm: Normal rate and regular rhythm.     Heart sounds: Normal heart sounds. No murmur heard. Pulmonary:     Effort: Pulmonary effort is normal. No respiratory distress.     Breath sounds: Normal breath sounds. No wheezing, rhonchi or rales.  Chest:  Breasts:    Right: Normal. No inverted nipple, mass, nipple discharge or skin change.     Left: Normal. No inverted nipple, mass, nipple discharge or skin change.  Abdominal:     General: Bowel sounds are normal. There is no distension.     Palpations: Abdomen is soft. There is no mass.     Tenderness: There is no abdominal tenderness. There is no guarding or rebound.     Hernia: No hernia is present.  Neurological:     General: No focal deficit present.     Mental Status: He is alert. Mental status is at baseline.     Cranial Nerves: No cranial nerve deficit.     Sensory: No sensory deficit.     Motor: No weakness.     Coordination: Coordination normal.     Gait: Gait normal.           Assessment & Plan:  Hyponatremia - Plan: CBC with Differential/Platelet, Comprehensive metabolic panel with GFR  History of pneumonia - Plan: CBC with Differential/Platelet, Comprehensive metabolic panel with GFR  Elevated LFTs - Plan: CBC with Differential/Platelet, Comprehensive metabolic panel with GFR Restrict water to less than 1200 ml per day.  Stay off hydrochlorothiazide .  Continue lisinopril  and metoprolol .  Recheck cbc and cmp.  If LFTs are normal, will resume zocor .  "

## 2024-08-03 ENCOUNTER — Ambulatory Visit: Payer: Self-pay | Admitting: Family Medicine

## 2024-08-10 ENCOUNTER — Other Ambulatory Visit

## 2024-08-17 ENCOUNTER — Ambulatory Visit: Admitting: Cardiovascular Disease

## 2024-08-24 ENCOUNTER — Other Ambulatory Visit

## 2024-09-11 ENCOUNTER — Encounter: Admitting: Family Medicine

## 2024-10-15 ENCOUNTER — Encounter
